# Patient Record
Sex: Male | Born: 1945 | Race: White | Hispanic: No | Marital: Married | Smoking: Never smoker
Health system: Southern US, Community
[De-identification: ages and names within clinical notes are randomized; demographics above are authoritative.]

## PROBLEM LIST (undated history)

## (undated) DIAGNOSIS — G629 Polyneuropathy, unspecified: Secondary | ICD-10-CM

## (undated) DIAGNOSIS — R42 Dizziness and giddiness: Secondary | ICD-10-CM

## (undated) DIAGNOSIS — K469 Unspecified abdominal hernia without obstruction or gangrene: Secondary | ICD-10-CM

## (undated) DIAGNOSIS — E785 Hyperlipidemia, unspecified: Secondary | ICD-10-CM

## (undated) DIAGNOSIS — Z9889 Other specified postprocedural states: Secondary | ICD-10-CM

## (undated) DIAGNOSIS — G4733 Obstructive sleep apnea (adult) (pediatric): Secondary | ICD-10-CM

## (undated) DIAGNOSIS — G4730 Sleep apnea, unspecified: Secondary | ICD-10-CM

## (undated) DIAGNOSIS — K589 Irritable bowel syndrome without diarrhea: Secondary | ICD-10-CM

## (undated) DIAGNOSIS — I2510 Atherosclerotic heart disease of native coronary artery without angina pectoris: Secondary | ICD-10-CM

## (undated) DIAGNOSIS — I452 Bifascicular block: Secondary | ICD-10-CM

## (undated) DIAGNOSIS — I6529 Occlusion and stenosis of unspecified carotid artery: Secondary | ICD-10-CM

## (undated) DIAGNOSIS — N209 Urinary calculus, unspecified: Secondary | ICD-10-CM

## (undated) DIAGNOSIS — Z9989 Dependence on other enabling machines and devices: Secondary | ICD-10-CM

## (undated) DIAGNOSIS — J449 Chronic obstructive pulmonary disease, unspecified: Secondary | ICD-10-CM

## (undated) DIAGNOSIS — I219 Acute myocardial infarction, unspecified: Secondary | ICD-10-CM

## (undated) DIAGNOSIS — K5792 Diverticulitis of intestine, part unspecified, without perforation or abscess without bleeding: Secondary | ICD-10-CM

## (undated) DIAGNOSIS — I10 Essential (primary) hypertension: Secondary | ICD-10-CM

## (undated) HISTORY — DX: Dependence on other enabling machines and devices: Z99.89

## (undated) HISTORY — DX: Acute myocardial infarction, unspecified: I21.9

## (undated) HISTORY — DX: Dizziness and giddiness: R42

## (undated) HISTORY — DX: Polyneuropathy, unspecified: G62.9

## (undated) HISTORY — DX: Other specified postprocedural states: Z98.890

## (undated) HISTORY — DX: Atherosclerotic heart disease of native coronary artery without angina pectoris: I25.10

## (undated) HISTORY — DX: Chronic obstructive pulmonary disease, unspecified: J44.9

## (undated) HISTORY — DX: Hyperlipidemia, unspecified: E78.5

## (undated) HISTORY — DX: Obstructive sleep apnea (adult) (pediatric): G47.33

## (undated) HISTORY — DX: Occlusion and stenosis of unspecified carotid artery: I65.29

## (undated) HISTORY — DX: Irritable bowel syndrome, unspecified: K58.9

## (undated) HISTORY — DX: Urinary calculus, unspecified: N20.9

## (undated) HISTORY — DX: Unspecified abdominal hernia without obstruction or gangrene: K46.9

## (undated) HISTORY — DX: Bifascicular block: I45.2

## (undated) HISTORY — DX: Essential (primary) hypertension: I10

## (undated) HISTORY — PX: UMBILICAL HERNIA REPAIR: SHX196

## (undated) HISTORY — PX: OTHER SURGICAL HISTORY: SHX169

## (undated) HISTORY — DX: Sleep apnea, unspecified: G47.30

## (undated) HISTORY — DX: Morbid (severe) obesity due to excess calories: E66.01

## (undated) HISTORY — DX: Diverticulitis of intestine, part unspecified, without perforation or abscess without bleeding: K57.92

---

## 1997-02-25 HISTORY — PX: OTHER SURGICAL HISTORY: SHX169

## 2000-07-01 ENCOUNTER — Ambulatory Visit (HOSPITAL_COMMUNITY): Admission: RE | Admit: 2000-07-01 | Discharge: 2000-07-01 | Payer: Self-pay | Admitting: Family Medicine

## 2000-07-01 ENCOUNTER — Encounter: Payer: Self-pay | Admitting: Family Medicine

## 2003-10-17 ENCOUNTER — Encounter: Admission: RE | Admit: 2003-10-17 | Discharge: 2003-10-17 | Payer: Self-pay | Admitting: Orthopedic Surgery

## 2005-04-10 DIAGNOSIS — Z9889 Other specified postprocedural states: Secondary | ICD-10-CM

## 2005-04-10 HISTORY — DX: Other specified postprocedural states: Z98.890

## 2009-11-12 ENCOUNTER — Ambulatory Visit: Payer: Self-pay | Admitting: Internal Medicine

## 2009-11-12 DIAGNOSIS — R0602 Shortness of breath: Secondary | ICD-10-CM | POA: Insufficient documentation

## 2009-11-12 LAB — PULMONARY FUNCTION TEST

## 2009-11-14 ENCOUNTER — Encounter: Payer: Self-pay | Admitting: Internal Medicine

## 2010-04-27 DIAGNOSIS — K5792 Diverticulitis of intestine, part unspecified, without perforation or abscess without bleeding: Secondary | ICD-10-CM

## 2010-04-27 HISTORY — DX: Diverticulitis of intestine, part unspecified, without perforation or abscess without bleeding: K57.92

## 2010-05-05 ENCOUNTER — Ambulatory Visit: Payer: Self-pay | Admitting: Diagnostic Radiology

## 2010-05-05 ENCOUNTER — Inpatient Hospital Stay (HOSPITAL_COMMUNITY): Admission: EM | Admit: 2010-05-05 | Discharge: 2010-05-10 | Payer: Self-pay | Admitting: Internal Medicine

## 2010-05-05 ENCOUNTER — Encounter: Payer: Self-pay | Admitting: Emergency Medicine

## 2010-07-25 ENCOUNTER — Encounter
Admission: RE | Admit: 2010-07-25 | Discharge: 2010-07-25 | Payer: Self-pay | Source: Home / Self Care | Attending: Gastroenterology | Admitting: Gastroenterology

## 2010-08-27 NOTE — Miscellaneous (Signed)
Summary: Orders Update  Clinical Lists Changes  Problems: Added new problem of DYSPNEA (ICD-786.05) Orders: Added new Service order of Carbon Monoxide diffusing w/capacity [id number]) - Signed Added new Service order of Lung Volumes (60454) - Signed Added new Service order of Spirometry (Pre & Post) [phone]) - Signed

## 2010-09-10 ENCOUNTER — Ambulatory Visit (HOSPITAL_COMMUNITY)
Admission: RE | Admit: 2010-09-10 | Discharge: 2010-09-10 | Disposition: A | Discharge status: Home / Self Care | Payer: Managed Care, Other (non HMO) | Source: Ambulatory Visit | Attending: General Surgery | Admitting: General Surgery

## 2010-09-10 ENCOUNTER — Other Ambulatory Visit: Payer: Self-pay | Admitting: General Surgery

## 2010-09-10 ENCOUNTER — Encounter (HOSPITAL_COMMUNITY): Payer: Managed Care, Other (non HMO)

## 2010-09-10 ENCOUNTER — Other Ambulatory Visit (HOSPITAL_COMMUNITY): Payer: Self-pay | Admitting: General Surgery

## 2010-09-10 DIAGNOSIS — Z01812 Encounter for preprocedural laboratory examination: Secondary | ICD-10-CM | POA: Insufficient documentation

## 2010-09-10 DIAGNOSIS — Z01818 Encounter for other preprocedural examination: Secondary | ICD-10-CM

## 2010-09-10 DIAGNOSIS — I517 Cardiomegaly: Secondary | ICD-10-CM | POA: Insufficient documentation

## 2010-09-10 DIAGNOSIS — R0602 Shortness of breath: Secondary | ICD-10-CM | POA: Insufficient documentation

## 2010-09-10 LAB — COMPREHENSIVE METABOLIC PANEL
ALT: 41 U/L (ref 0–53)
AST: 22 U/L (ref 0–37)
Albumin: 4.3 g/dL (ref 3.5–5.2)
Alkaline Phosphatase: 66 U/L (ref 39–117)
CO2: 29 mEq/L (ref 19–32)
Chloride: 104 mEq/L (ref 96–112)
Creatinine, Ser: 0.86 mg/dL (ref 0.4–1.5)
GFR calc Af Amer: >60 mL/min (ref >60)
GFR calc non Af Amer: >60 mL/min (ref >60)
Potassium: 4.5 mEq/L (ref 3.5–5.1)
Sodium: 140 mEq/L (ref 135–145)
Total Bilirubin: 1.6 mg/dL — ABNORMAL HIGH (ref 0.3–1.2)

## 2010-09-10 LAB — DIFFERENTIAL
Basophils Absolute: 0 10*3/uL (ref 0.0–0.1)
Eosinophils Relative: 2 % (ref 0–5)
Lymphocytes Relative: 32 % (ref 12–46)
Lymphs Abs: 2.5 10*3/uL (ref 0.7–4.0)
Neutro Abs: 4.5 10*3/uL (ref 1.7–7.7)

## 2010-09-10 LAB — CBC
MCV: 90 fL (ref 78.0–100.0)
Platelets: 146 10*3/uL — ABNORMAL LOW (ref 150–400)
RBC: 5.12 MIL/uL (ref 4.22–5.81)
RDW: 12.8 % (ref 11.5–15.5)
WBC: 7.9 10*3/uL (ref 4.0–10.5)

## 2010-09-18 ENCOUNTER — Inpatient Hospital Stay (HOSPITAL_COMMUNITY)
Admission: RE | Admit: 2010-09-18 | Discharge: 2010-09-26 | DRG: 330 | Disposition: A | Discharge status: Home / Self Care | Payer: Managed Care, Other (non HMO) | Source: Ambulatory Visit | Attending: General Surgery | Admitting: General Surgery

## 2010-09-18 ENCOUNTER — Other Ambulatory Visit: Payer: Self-pay | Admitting: General Surgery

## 2010-09-18 DIAGNOSIS — I10 Essential (primary) hypertension: Secondary | ICD-10-CM | POA: Diagnosis present

## 2010-09-18 DIAGNOSIS — Z951 Presence of aortocoronary bypass graft: Secondary | ICD-10-CM

## 2010-09-18 DIAGNOSIS — I2510 Atherosclerotic heart disease of native coronary artery without angina pectoris: Secondary | ICD-10-CM | POA: Diagnosis present

## 2010-09-18 DIAGNOSIS — K560 Paralytic ileus: Secondary | ICD-10-CM | POA: Diagnosis not present

## 2010-09-18 DIAGNOSIS — J449 Chronic obstructive pulmonary disease, unspecified: Secondary | ICD-10-CM | POA: Diagnosis present

## 2010-09-18 DIAGNOSIS — K5732 Diverticulitis of large intestine without perforation or abscess without bleeding: Principal | ICD-10-CM | POA: Diagnosis present

## 2010-09-18 DIAGNOSIS — G4733 Obstructive sleep apnea (adult) (pediatric): Secondary | ICD-10-CM | POA: Diagnosis present

## 2010-09-18 DIAGNOSIS — K429 Umbilical hernia without obstruction or gangrene: Secondary | ICD-10-CM | POA: Diagnosis present

## 2010-09-18 DIAGNOSIS — J4489 Other specified chronic obstructive pulmonary disease: Secondary | ICD-10-CM | POA: Diagnosis present

## 2010-09-18 HISTORY — PX: URETERAL STENT PLACEMENT: SHX822

## 2010-09-18 LAB — ABO/RH: ABO/RH(D): O POS

## 2010-09-18 LAB — TYPE AND SCREEN: ABO/RH(D): O POS

## 2010-09-18 NOTE — Op Note (Signed)
NAME:  BASCOM, BIEL NO.:  [id number]  MEDICAL RECORD NO.:  [id number]           PATIENT TYPE:  I  LOCATION:  0002                         FACILITY:  [facility]  PHYSICIAN:  Juanetta Gosling, MDDATE OF BIRTH:  [birth year]  DATE OF PROCEDURE: DATE OF DISCHARGE:                              OPERATIVE REPORT   PREOPERATIVE DIAGNOSIS:  Persistent diverticulitis.  POSTOPERATIVE DIAGNOSIS:  Persistent chronic diverticulitis.  PROCEDURE: 1. Laparoscopic-assisted sigmoid colectomy with primary hand-sewn colo-     colo anastomosis. 2. Rigid proctoscopy. 3. Primary umbilical repair. 4. Left ureteral stent placement by Dr. Marcine Matar.  SURGEON:  Juanetta Gosling, MD  ASSISTANT:  Ollen Gross. Vernell Morgans, M.D.  ANESTHESIA:  General.  FINDINGS:  Focal segment of sigmoid diverticulitis with evidence of chronic changes that was adherent to the sidewall of his abdomen.  SPECIMEN:  Sigmoid colon with stitch marking proximal.  SPECIMEN:  Pathology.  ESTIMATED BLOOD LOSS:  100 mL.  COMPLICATIONS:  None.  DRAINS:  None.  DISPOSITION:  To recovery room in stable condition.  INDICATIONS:  Mr. Swor is a 65 year old male with issue admitted in October with complicated diverticulitis.  He has been unable to get off his antibiotics during that time and has had what I would call persistent diverticulitis.  He and I eventually after trying to go off the antibiotic several times have both decided and with an abnormal CT scan have both decided that we would need to proceed with surgery with the sigmoid colectomy.  We discussed laparoscopic possible open sigmoid colectomy with the risk of a possible ostomy depending on what his disease looked like at the time of surgery.  PROCEDURE:  After informed consent was obtained, the patient was taken to the operating room.  He was marked preoperatively for an ostomy just in case he was administered 400 mg of IV ciprofloxacin,  500 mg of Flagyl due to the fact that he had a reaction to Invanz in the past.  He had sequential compression devices placed on lower extremities prior to induction of anesthesia and was then placed in low lithotomy.  At the sigmoid, Dr. Marcine Matar placed a left ureteral stent first.  His abdomen was prepped and draped in standard sterile surgical fashion.  A surgical time-out was then performed.  An orogastric tube and Foley as well as a stent were in place.  I then made an incision in the left upper quadrant and infiltration with local anesthetic and did an Opti-Vu technique and inserted a 5-mm trocar.  His abdomen was then insufflated with 15 mmHg pressure.  He has had a lot of additional fat and was difficult to do this portion of the operation.  I then inserted two further 5-mm trocars in the right lower quadrant and midabdomen as well as a 10-mm trocar in the right midabdomen.  These were all done under direct vision without complication after infiltration with local anesthetic.  His omentum was packed towards up cephalad.  I then took down the [employer] near the splenic flexure.  He really had a very focal segment of sigmoid diverticular disease.  That was not any longer than about 6 cm.  There were no other real diverticular to speak of and his colon was soft around those areas. I used a suction catheter to take this down from the wall, which I did when there was no evidence of infection.  I did not enter the bile during this portion of the procedure, rather it was more just of chronic changes but it was very adherent to that area and hard.  I then inserted a hand port below his umbilicus.  The colon was very free at this point. We used a stapler and excised this focal segment of the sigmoid colon. Both ends were then without any tension and were then brought up.  I cut the staple lines off after placing corner stitches.  I then did a single layer 2-0 silk  interrupted anastomosis from his colon to his rectum. This appeared to look well upon completion.  We probed the areas.  There were no spaces that were identified.  I then did a rigid proctoscope which distended pass the anastomosis and there was no evidence of any leakage of air.  Following this, we then inspected the area.  Hemostasis was observed.  We then evacuated the irrigation.  I then placed the GelPort back and looked and I placed the omentum down his left lower quadrant.  There is no evidence of any bleeding where I had removed this colon from the sidewall.  I then removed all the laparoscopic trocars. I then pulled a hand port out and we then closed his fascia with #1 loop PDS.  I extended the incision a little bit to include his small umbilical hernia and then I closed this primarily with the #1 loop PDS as well.  Irrigation was performed to his wound.  I then stapled all of his wounds together, placed on Telfa wicks inside the abdominal wound, and dressings were placed overlying these.  He tolerated this well, was extubated in the operating room and transferred to the recovery room in stable condition.     Juanetta Gosling, MD     MCW/MEDQ  D:  09/18/2010  T:  09/18/2010  Job:  [id number]  cc:   Emeterio Reeve, MD Fax: [phone]  Llana Aliment. Malon Kindle., M.D. Fax: [phone]  Jake Bathe, MD Fax: [phone]  Electronically Signed by Emelia Loron MD on 09/18/2010 04:51:39 PM

## 2010-09-19 LAB — CBC
HCT: 43.4 % (ref 39.0–52.0)
MCV: 93.5 fL (ref 78.0–100.0)
RBC: 4.64 MIL/uL (ref 4.22–5.81)
RDW: 13.1 % (ref 11.5–15.5)
WBC: 9.5 10*3/uL (ref 4.0–10.5)

## 2010-09-19 LAB — BASIC METABOLIC PANEL
Chloride: 106 mEq/L (ref 96–112)
GFR calc non Af Amer: >60 mL/min (ref >60)
Glucose, Bld: 133 mg/dL — ABNORMAL HIGH (ref 70–99)
Potassium: 4.6 mEq/L (ref 3.5–5.1)
Sodium: 139 mEq/L (ref 135–145)

## 2010-09-21 LAB — BASIC METABOLIC PANEL
BUN: 7 mg/dL (ref 6–23)
CO2: 25 mEq/L (ref 19–32)
Glucose, Bld: 125 mg/dL — ABNORMAL HIGH (ref 70–99)
Potassium: 4 mEq/L (ref 3.5–5.1)
Sodium: 138 mEq/L (ref 135–145)

## 2010-09-21 LAB — CBC
HCT: 41.4 % (ref 39.0–52.0)
Hemoglobin: 13.8 g/dL (ref 13.0–17.0)
MCHC: 33.3 g/dL (ref 30.0–36.0)
MCV: 91.6 fL (ref 78.0–100.0)
WBC: 7.6 10*3/uL (ref 4.0–10.5)

## 2010-09-24 LAB — BASIC METABOLIC PANEL
BUN: 5 mg/dL — ABNORMAL LOW (ref 6–23)
CO2: 26 mEq/L (ref 19–32)
Calcium: 8.9 mg/dL (ref 8.4–10.5)
Creatinine, Ser: 0.76 mg/dL (ref 0.4–1.5)
Glucose, Bld: 122 mg/dL — ABNORMAL HIGH (ref 70–99)

## 2010-09-24 LAB — CBC
HCT: 38.2 % — ABNORMAL LOW (ref 39.0–52.0)
Hemoglobin: 12.9 g/dL — ABNORMAL LOW (ref 13.0–17.0)
MCH: 30.9 pg (ref 26.0–34.0)
MCHC: 33.8 g/dL (ref 30.0–36.0)

## 2010-09-26 NOTE — Op Note (Signed)
  NAME:  Paul Bryant, BARRACK NO.:  [id number]  MEDICAL RECORD NO.:  [id number]           PATIENT TYPE:  I  LOCATION:  0002                         FACILITY:  [facility]  PHYSICIAN:  Bertram Millard. Tacari Repass, M.D.DATE OF BIRTH:  Nov 14, 1945  DATE OF PROCEDURE:  09/18/2010 DATE OF DISCHARGE:                              OPERATIVE REPORT   PREOPERATIVE DIAGNOSIS:  Diverticulitis.  POSTOPERATIVE DIAGNOSIS:  Diverticulitis.  PRINCIPAL PROCEDURE: 1. Cystoscopy. 2. Placement of 6-French ureteral catheter in left ureter.  SURGEON:  Bertram Millard. Slayter Moorhouse, M.D.  ANESTHESIA:  General endotracheal.  COMPLICATIONS:  None.  BRIEF HISTORY:  The patient is a 65 year old male who presents at this time for sigmoid colectomy, as primary treatment for diverticulitis. Dr. Dwain Sarna has requested ureteral catheter placement to assist in his dissection, to avoid ureteral injury during the colectomy.  I met the patient preoperatively in the holding area and discussed the procedure.  He understands the nature of cystoscopy and ureteral catheter placement and desires to proceed.  DESCRIPTION OF PROCEDURE:  I marked the patient's left side in the holding area.  He received preoperative IV antibiotics.  He was taken to the operating room where general anesthetic was administered.  He was placed in the dorsal lithotomy position.  Genitalia and perineum were prepped and draped.  Time-out was then called.  The procedure then commenced.  A 22-French panendoscope was advanced under direct vision through his urethra.  Urethra was normal without stricture.  Prostate was nonobstructive.  Bladder was entered and inspected circumferentially.  The no tumors, trabeculations, or foreign bodies.  Ureteral orifices were normal in configuration and location. The left ureter was cannulated with 6-French open-ended catheter and this was easily advanced proximally to the patient's renal pelvis. Urine was seen  dripping out the distal end of this.  At this point, the cystoscope was removed after the bladder was drained.  I placed a 16- [place] Foley catheter.  The balloon was filled with 10 cc of water.  A Goldberg catheter adapter was used to drain the ureteral catheter and the Foley catheter.  The ureteral catheter was tied to the patient's Foley catheter.  I discussed the need to remove the ureteral catheter with the nursing staff following the procedure.  They will do this.  The patient tolerated the procedure well.  Dr. Dwain Sarna then commenced with his part of the procedure.     Bertram Millard. Cadon Raczka, M.D.     SMD/MEDQ  D:  09/18/2010  T:  09/18/2010  Job:  [id number]  cc:   Juanetta Gosling, MD [address] [place] [address]  Electronically Signed by Marcine Matar M.D. on 09/26/2010 07:57:31 AM

## 2010-09-30 NOTE — Discharge Summary (Signed)
NAME:  STORY, VANVRANKEN NO.:  [id number]  MEDICAL RECORD NO.:  [id number]           PATIENT TYPE:  I  LOCATION:  1522                         FACILITY:  [facility]  PHYSICIAN:  Juanetta Gosling, MDDATE OF BIRTH:  [birth year]  DATE OF ADMISSION:  09/18/2010 DATE OF DISCHARGE:  09/26/2010                              DISCHARGE SUMMARY   ADMISSION DIAGNOSES: 1. Chronic diverticular disease. 2. Coronary artery disease. 3. Hypertension. 4. Diabetes.  DISCHARGE DIAGNOSES: 1. Chronic diverticular disease. 2. Coronary artery disease. 3. Hypertension. 4. Diabetes. 5. Resolved ileus.  PROCEDURES PERFORMED:  September 18, 2010, laparoscopic-assisted sigmoid colectomy with primary hand-sewn anastomosis, umbilical hernia repair, rigid proctoscopy, left ureteral stent by Dr. Willow Ora.  CONSULTATIONS:  None.  HISTORY AND HOSPITAL COURSE:  Mr. Lemelle is a 65 year old male who initially was admitted in October with complicated diverticulitis.  He was really unable to come off of antibiotics by the time I had seen him again in January.  He underwent a repeat CT scan with Dr. Randa Evens still showing some persistent diverticular changes and he was still having symptoms.  We discussed several times and eventually agreed upon a sigmoid colectomy.  He was taken to the operating room on February 22 and underwent a fairly uneventful laparoscopic-assisted sigmoid colectomy with primary anastomosis.  Postoperatively he was maintained on the floor.  He was placed on DVT prophylaxis with subcutaneous heparin and later switched to Lovenox and SCDs.  His Foley catheter was removed on postoperative day #2.  He was given ice chips for the first several days.  He began to pass some flatus on postoperative day #5 and from postoperative day #5 to #8 he began to pass more flatus, have bowel movements and his diet was advanced and he was tolerating this well.  He had some ecchymosis  around some of his incisions  and I actually removed a couple of staples from his abdominal wound just to see if there was any infection and there was not.  I had had him on some ciprofloxacin prior to this just to ensure that there was no infection, but it does not appear that he had had a wound infection, just some old hematoma that was present.  On postoperative day #8 he was having bowel movements, tolerating a regular diet, doing well with pain under control and is ready for discharge.  DISCHARGE MEDICATIONS: 1. Colace 100 mg p.o. b.i.d. 2. Percocet as needed. 3. Aspirin 81 mg p.o. daily. 4. Avapro 300 mg q.h.s. 5. Colchicine as needed. 6. Crestor 40 mg daily. 7. Lasix 20 mg daily. 8. Nitroglycerin as needed. 9. Proventil as needed. 10.Xanax as needed. 11.Zebeta 10 mg b.i.d.  DISCHARGE INSTRUCTIONS:  His discharge instructions are per the hard copy of [facility] instructions that have been placed in his chart.  FOLLOWUP:  His followup was with Dr. Dwain Sarna at [facility] on March 9 at 2:45.  RADIOLOGY:  His pertinent radiologic evaluation showed a chest x-ray on February 14 that showed some cardiomegaly and retrocardiac atelectasis or scarring.  LABORATORY DATA:  His pertinent laboratory evaluation shows an admission hematocrit of 46.1 and  a discharge hematocrit of 38.2.  His creatinine upon discharge is 0.76.  PATHOLOGY:  His pathology shows him to have diverticulitis with associated pericolonic inflammation and fibrosis with no malignancy identified.     Juanetta Gosling, MD     MCW/MEDQ  D:  09/26/2010  T:  09/26/2010  Job:  [id number]  cc:   Emeterio Reeve, MD Fax: [phone]  Llana Aliment. Malon Kindle., M.D. Fax: [phone]  Jake Bathe, MD Fax: [phone]  Electronically Signed by Emelia Loron MD on 09/30/2010 09:54:35 AM

## 2010-10-10 LAB — COMPREHENSIVE METABOLIC PANEL
AST: 16 U/L (ref 0–37)
Albumin: 4.2 g/dL (ref 3.5–5.2)
Albumin: 3.8 g/dL (ref 3.5–5.2)
Alkaline Phosphatase: 58 U/L (ref 39–117)
BUN: 20 mg/dL (ref 6–23)
BUN: 11 mg/dL (ref 6–23)
BUN: 13 mg/dL (ref 6–23)
CO2: 27 mEq/L (ref 19–32)
Chloride: 102 mEq/L (ref 96–112)
Chloride: 103 mEq/L (ref 96–112)
Creatinine, Ser: 0.8 mg/dL (ref 0.4–1.5)
Creatinine, Ser: 0.77 mg/dL (ref 0.4–1.5)
Creatinine, Ser: 0.86 mg/dL (ref 0.4–1.5)
GFR calc Af Amer: >60 mL/min (ref >60)
GFR calc non Af Amer: >60 mL/min (ref >60)
GFR calc non Af Amer: >60 mL/min (ref >60)
Potassium: 3.9 mEq/L (ref 3.5–5.1)
Total Bilirubin: 1.5 mg/dL — ABNORMAL HIGH (ref 0.3–1.2)
Total Bilirubin: 3.1 mg/dL — ABNORMAL HIGH (ref 0.3–1.2)
Total Protein: 6.7 g/dL (ref 6.0–8.3)

## 2010-10-10 LAB — CBC
HCT: 40.6 % (ref 39.0–52.0)
Hemoglobin: 14 g/dL (ref 13.0–17.0)
Hemoglobin: 14 g/dL (ref 13.0–17.0)
MCH: 31.7 pg (ref 26.0–34.0)
MCH: 31.2 pg (ref 26.0–34.0)
MCH: 31.7 pg (ref 26.0–34.0)
MCHC: 33.8 g/dL (ref 30.0–36.0)
MCHC: 35.2 g/dL (ref 30.0–36.0)
MCV: 93.2 fL (ref 78.0–100.0)
MCV: 90.2 fL (ref 78.0–100.0)
MCV: 91.9 fL (ref 78.0–100.0)
MCV: 93.8 fL (ref 78.0–100.0)
Platelets: 138 10*3/uL — ABNORMAL LOW (ref 150–400)
Platelets: 130 10*3/uL — ABNORMAL LOW (ref 150–400)
Platelets: 149 10*3/uL — ABNORMAL LOW (ref 150–400)
RBC: 4.67 MIL/uL (ref 4.22–5.81)
RBC: 4.37 MIL/uL (ref 4.22–5.81)
RBC: 4.41 MIL/uL (ref 4.22–5.81)
RBC: 4.42 MIL/uL (ref 4.22–5.81)
RDW: 12.5 % (ref 11.5–15.5)
RDW: 12.9 % (ref 11.5–15.5)
WBC: 10.5 10*3/uL (ref 4.0–10.5)
WBC: 11 10*3/uL — ABNORMAL HIGH (ref 4.0–10.5)

## 2010-10-10 LAB — BASIC METABOLIC PANEL
BUN: 8 mg/dL (ref 6–23)
CO2: 25 mEq/L (ref 19–32)
CO2: 28 mEq/L (ref 19–32)
Calcium: 8.7 mg/dL (ref 8.4–10.5)
Calcium: 9.2 mg/dL (ref 8.4–10.5)
Chloride: 105 mEq/L (ref 96–112)
Creatinine, Ser: 0.7 mg/dL (ref 0.4–1.5)
Creatinine, Ser: 0.83 mg/dL (ref 0.4–1.5)
GFR calc Af Amer: >60 mL/min (ref >60)
GFR calc Af Amer: >60 mL/min (ref >60)
GFR calc non Af Amer: >60 mL/min (ref >60)
GFR calc non Af Amer: >60 mL/min (ref >60)
Glucose, Bld: 144 mg/dL — ABNORMAL HIGH (ref 70–99)
Potassium: 4.1 mEq/L (ref 3.5–5.1)
Sodium: 137 mEq/L (ref 135–145)
Sodium: 139 mEq/L (ref 135–145)

## 2010-10-10 LAB — CULTURE, BLOOD (ROUTINE X 2)
Culture  Setup Time: 201110101021
Culture: NO GROWTH
Culture: NO GROWTH

## 2010-10-10 LAB — URINALYSIS, ROUTINE W REFLEX MICROSCOPIC
Bilirubin Urine: NEGATIVE
Hgb urine dipstick: NEGATIVE
Ketones, ur: NEGATIVE mg/dL
Protein, ur: NEGATIVE mg/dL
Urobilinogen, UA: 0.2 mg/dL (ref 0.0–1.0)

## 2010-10-10 LAB — MAGNESIUM: Magnesium: 2 mg/dL (ref 1.5–2.5)

## 2010-10-10 LAB — HEPATIC FUNCTION PANEL
AST: 24 U/L (ref 0–37)
Albumin: 3.3 g/dL — ABNORMAL LOW (ref 3.5–5.2)
Albumin: 3.5 g/dL (ref 3.5–5.2)
Alkaline Phosphatase: 56 U/L (ref 39–117)
Bilirubin, Direct: 0.3 mg/dL (ref 0.0–0.3)
Indirect Bilirubin: 2 mg/dL — ABNORMAL HIGH (ref 0.3–0.9)
Total Bilirubin: 2.1 mg/dL — ABNORMAL HIGH (ref 0.3–1.2)
Total Protein: 6.5 g/dL (ref 6.0–8.3)

## 2010-10-10 LAB — LACTATE DEHYDROGENASE: LDH: 116 U/L (ref 94–250)

## 2010-10-10 LAB — HAPTOGLOBIN: Haptoglobin: 314 mg/dL — ABNORMAL HIGH (ref 16–200)

## 2010-10-10 LAB — HEPATITIS PANEL, ACUTE
HCV Ab: NEGATIVE
Hep B C IgM: NEGATIVE
Hepatitis B Surface Ag: NEGATIVE

## 2010-10-10 LAB — DIFFERENTIAL
Basophils Absolute: 0.1 10*3/uL (ref 0.0–0.1)
Lymphocytes Relative: 19 % (ref 12–46)
Monocytes Absolute: 0.4 10*3/uL (ref 0.1–1.0)
Neutro Abs: 7.2 10*3/uL (ref 1.7–7.7)

## 2010-11-23 ENCOUNTER — Encounter (INDEPENDENT_AMBULATORY_CARE_PROVIDER_SITE_OTHER): Payer: Self-pay | Admitting: General Surgery

## 2011-03-14 ENCOUNTER — Other Ambulatory Visit: Payer: Self-pay | Admitting: Family Medicine

## 2011-03-14 ENCOUNTER — Ambulatory Visit
Admission: RE | Admit: 2011-03-14 | Discharge: 2011-03-14 | Disposition: A | Discharge status: Home / Self Care | Payer: Managed Care, Other (non HMO) | Source: Ambulatory Visit | Attending: Family Medicine | Admitting: Family Medicine

## 2011-03-14 DIAGNOSIS — J189 Pneumonia, unspecified organism: Secondary | ICD-10-CM

## 2011-05-02 ENCOUNTER — Ambulatory Visit
Admission: RE | Admit: 2011-05-02 | Discharge: 2011-05-02 | Disposition: A | Discharge status: Home / Self Care | Payer: Managed Care, Other (non HMO) | Source: Ambulatory Visit | Attending: Family Medicine | Admitting: Family Medicine

## 2011-05-02 ENCOUNTER — Other Ambulatory Visit: Payer: Self-pay | Admitting: Family Medicine

## 2011-05-02 DIAGNOSIS — J189 Pneumonia, unspecified organism: Secondary | ICD-10-CM

## 2011-06-12 ENCOUNTER — Encounter: Payer: Self-pay | Admitting: Internal Medicine

## 2011-06-13 ENCOUNTER — Encounter: Payer: Self-pay | Admitting: Internal Medicine

## 2011-06-13 ENCOUNTER — Ambulatory Visit (INDEPENDENT_AMBULATORY_CARE_PROVIDER_SITE_OTHER): Payer: Managed Care, Other (non HMO) | Admitting: Internal Medicine

## 2011-06-13 VITALS — BP 148/70 | HR 76 | Ht 72.0 in | Wt 316.0 lb

## 2011-06-13 DIAGNOSIS — G4733 Obstructive sleep apnea (adult) (pediatric): Secondary | ICD-10-CM

## 2011-06-13 DIAGNOSIS — R0602 Shortness of breath: Secondary | ICD-10-CM

## 2011-06-13 DIAGNOSIS — R0600 Dyspnea, unspecified: Secondary | ICD-10-CM

## 2011-06-13 DIAGNOSIS — R0989 Other specified symptoms and signs involving the circulatory and respiratory systems: Secondary | ICD-10-CM

## 2011-06-13 NOTE — Progress Notes (Signed)
06/13/11- 55 yoM never smoker here with his wife on kind referral from Dr. Paulino Rily for complaint of dyspnea.  He had pneumonia with a flu like illness early in the year and then again in August 2012, both treated as outpatient. He had been playing tennis regularly, as recently as April or May. He had a partial colectomy in March of 2012. He had tried to ride an [employer] [place] [employer] he had to stop that when he developed pressure soreness from the seat. He has had no regular exercise since June of 2012. He regained weight he had lost. Activity is now limited by knee pain.   He had been retaining fluid. He was evaluated for cardiology at [facility] and told his cardiac situation was stable. An echocardiogram was done and Lasix was doubled. 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 He denies exertional heart pain or palpitation now and ankle edema is controlled. There is no history of DVT or pulmonary embolism. NPSG at [facility]- dx'd OSA, treated with CPAP used all night, every night and helpful. He is asking that I address this. We will need to get his original diagnostic sleep study. Has had flu and pneumonia vaccines.  ROS-see HPI Constitutional:   No-   weight loss, night sweats, fevers, chills, fatigue, lassitude. HEENT:   No-  headaches, difficulty swallowing, tooth/dental problems, sore throat,       No-  sneezing, itching, ear ache, nasal congestion, post nasal drip,  CV:  No-   chest pain, orthopnea, PND, swelling in lower extremities, anasarca,                                  dizziness, palpitations Resp: + shortness of breath with exertion, not at rest.              No-   productive cough,  No non-productive cough,  No- coughing up of blood.              No-   change in color of mucus.  No- wheezing.   Skin: No-   rash or lesions. GI:  No-   heartburn, indigestion, abdominal pain, nausea, vomiting, diarrhea,             change in bowel habits, loss of appetite GU: No-   dysuria, change in color of urine, no urgency or frequency.  No- flank pain. MS:  No-   joint pain or swelling.  No- decreased range of motion.  No- back pain. Neuro-     nothing unusual Psych:  No- change in mood or affect. No depression or anxiety.  No memory loss.  OBJ- General- Alert, Oriented, Affect-appropriate, Distress- none acute, very obese Skin- rash-none, lesions- none, excoriation- none Lymphadenopathy- none Head- atraumatic            Eyes- Gross vision intact, PERRLA, conjunctivae clear secretions            Ears- Hearing, canals-normal            Nose- Clear, no-Septal dev, mucus, polyps, erosion, perforation             Throat- Mallampati IV , mucosa clear , drainage- none, tonsils- atrophic Neck- flexible , trachea midline, no stridor , thyroid nl, carotid no bruit Chest - symmetrical excursion , unlabored  Heart/CV- RRR , + 2/6 Systolic murmur , no gallop  , no rub, nl s1 s2                           - JVD- none , edema- 1-2+, stasis changes- none, varices- none           Lung- clear to P&A, wheeze- none, cough- none , dullness-none, rub- none           Chest wall-  Abd- tender-no, distended-no, bowel sounds-present, HSM- no Br/ Gen/ Rectal- Not done, not indicated Extrem- cyanosis- none, clubbing, none, atrophy- none, strength- nl Neuro- grossly intact to observation

## 2011-06-13 NOTE — Progress Notes (Deleted)
  Subjective:    Patient ID: Paul Bryant, male    DOB: 1945-08-02, 65 y.o.   MRN: [id number]  HPI    Review of Systems  Constitutional: Positive for unexpected weight change. Negative for fever.  Respiratory: Positive for cough and shortness of breath.   Musculoskeletal: Positive for joint swelling.       Objective:   Physical Exam        Assessment & Plan:

## 2011-06-13 NOTE — Patient Instructions (Signed)
Order- schedule PFT and 6 MWT dx Dyspnea  We will check for old records including old sleep studies  Sample Spiriva - 1 puff daily- see if it helps shortness of breath  Use up and then stop Symbicort- see if you can tell it made any difference

## 2011-06-15 DIAGNOSIS — G4733 Obstructive sleep apnea (adult) (pediatric): Secondary | ICD-10-CM | POA: Insufficient documentation

## 2011-06-15 NOTE — Assessment & Plan Note (Signed)
We will pull paper charts and try to get his original diagnostic sleep study from the originating lab. Eventually we may need to auto titrate for pressure check.

## 2011-06-15 NOTE — Assessment & Plan Note (Signed)
Most of the change this summer as follows his reported 2 episodes of pneumonia, partial colectomy, cessation of exercise and associated weight gain.  There will be a component from cardiomyopathy based on his history of myocardial infarction and reduced ejection fraction on echocardiogram. His ankle edema is nonspecific but may have cardiac and peripheral venous components. There is no history to indicate venous thromboembolic disease He is severely overweight and has been for many years. Obesity hypoventilation syndrome as likely, but he doesn't complain of dyspnea at rest. Plan- we are scheduling 6 minute walk test and pulmonary function tests. He will try a sample of Spiriva. He has a Symbicort inhaler, used intermittently. I have asked him to use that twice daily until it runs out and then stop, so that he can decide if it helps or not. I am not anticipating much reactive airways disease.

## 2011-06-24 ENCOUNTER — Encounter: Payer: Self-pay | Admitting: Internal Medicine

## 2011-07-24 ENCOUNTER — Ambulatory Visit (INDEPENDENT_AMBULATORY_CARE_PROVIDER_SITE_OTHER): Payer: Managed Care, Other (non HMO) | Admitting: Internal Medicine

## 2011-07-24 ENCOUNTER — Encounter: Payer: Self-pay | Admitting: Internal Medicine

## 2011-07-24 VITALS — BP 120/82 | HR 91 | Ht 72.0 in | Wt 312.0 lb

## 2011-07-24 DIAGNOSIS — R0600 Dyspnea, unspecified: Secondary | ICD-10-CM

## 2011-07-24 DIAGNOSIS — R0602 Shortness of breath: Secondary | ICD-10-CM

## 2011-07-24 DIAGNOSIS — R0989 Other specified symptoms and signs involving the circulatory and respiratory systems: Secondary | ICD-10-CM

## 2011-07-24 DIAGNOSIS — G4733 Obstructive sleep apnea (adult) (pediatric): Secondary | ICD-10-CM

## 2011-07-24 LAB — PULMONARY FUNCTION TEST

## 2011-07-24 NOTE — Patient Instructions (Signed)
Our staff is being asked to get your original diagnostic NPSG report from [facility]  Order- Christoper Allegra- Autotitrate CPAP for pressure check- 5-20 cwp x 2 weeks   Dx OSA                        Replacement mask of choice and supplies  Suggest you get a pulse oximeter and use it to guide exertion to keep Oxygen saturation above 89%

## 2011-07-24 NOTE — Progress Notes (Signed)
PFT done today. 

## 2011-07-24 NOTE — Progress Notes (Signed)
06/13/11- [name] never smoker here with his wife on kind referral from Dr. Paulino Bryant for complaint of dyspnea.  He had pneumonia with a flu like illness early in the year and then again in August 2012, both treated as outpatient. He had been playing tennis regularly, as recently as April or May. He had a partial colectomy in March of 2012. He had tried to ride an [employer], [employer] at [employer] He had to stop that when he developed pressure soreness from the seat. He has had no regular exercise since June of 2012. He regained weight he had lost. Activity is now limited by knee pain.   He had been retaining fluid. He was evaluated for cardiology at [place] and told his cardiac situation was stable. An echocardiogram was done and Lasix was doubled. 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 He denies exertional heart pain or palpitation now and ankle edema is controlled. There is no history of DVT or pulmonary embolism. NPSG at [facility]- dx'd OSA, treated with CPAP used all night, every night and helpful. He is asking that I address this. We will need to get his original diagnostic sleep study. Has had flu and pneumonia vaccines.  07/24/11- 65 yoM never smoker followed for Dyspnea, OSA, obesity, CAD/Hx MI . Wife here No significant change since last visit. He comes for review pulmonary function studies. PFT-07/24/2011-normal spirometry flows within significant response to bronchodilator. Diffusion mildly reduced. FEV1/FVC 0.77. 6 Minute Walk Test- 96%, 88%, 98% 432 meters. Note exertional desaturation.   ROS-see HPI Constitutional:   No-   weight loss, night sweats, fevers, chills, fatigue, lassitude. HEENT:   No-  headaches, difficulty swallowing, tooth/dental problems, sore throat,       No-  sneezing, itching, ear ache, nasal congestion, post nasal drip,  CV:  No-   chest pain, orthopnea, PND, swelling in lower extremities,  anasarca, dizziness, palpitations Resp: + shortness of breath with exertion, not at rest.              No-   productive cough,  No non-productive cough,  No- coughing up of blood.              No-   change in color of mucus.  No- wheezing.   Skin: No-   rash or lesions. GI:  No-   heartburn, indigestion, abdominal pain, nausea, vomiting, diarrhea,                 change in bowel habits, loss of appetite GU: MS:  No-   joint pain or swelling.  No- decreased range of motion.  No- back pain. Neuro-     nothing unusual Psych:  No- change in mood or affect. No depression or anxiety.  No memory loss.  OBJ- General- Alert, Oriented, Affect-appropriate, Distress- none acute, very obese Skin- rash-none, lesions- none, excoriation- none Lymphadenopathy- none Head- atraumatic            Eyes- Gross vision intact, PERRLA, conjunctivae clear secretions            Ears- Hearing, canals-normal            Nose- Clear, no-Septal dev, mucus, polyps, erosion, perforation             Throat- Mallampati IV , mucosa clear , drainage- none, tonsils- atrophic Neck- flexible , trachea midline, no stridor , thyroid nl, carotid no bruit Chest - symmetrical excursion ,  unlabored           Heart/CV- RRR , + 2/6 Systolic murmur , no gallop  , no rub, nl s1 s2                           - JVD- none , edema- 1-2+, stasis changes- none, varices- none           Lung- clear to P&A, wheeze- none, cough- none , dullness-none, rub- none           Chest wall-  Abd- tender-no, distended-no, bowel sounds-present, HSM- no Br/ Gen/ Rectal- Not done, not indicated Extrem- cyanosis- none, clubbing, none, atrophy- none, strength- nl Neuro- grossly intact to observation

## 2011-07-26 ENCOUNTER — Encounter: Payer: Self-pay | Admitting: Internal Medicine

## 2011-07-26 NOTE — Assessment & Plan Note (Signed)
Primary mechanism is likely to be obesity hypoventilation syndrome aggravated by deconditioning.  Question significance of systolic heart murmur as an indicator of valvular heart disease. History of MI/CABG with EF about 45% predicted. I recommend weight loss, gradual, paced exercise. This was discussed with him and his wife. Knee pain is significantly limiting.

## 2011-07-26 NOTE — Assessment & Plan Note (Signed)
He asked that I manage this. We have requested his diagnostic NPSG from [facility]

## 2011-07-27 NOTE — Progress Notes (Signed)
Documentation for 6 Minute Walk Test 

## 2011-07-27 NOTE — Assessment & Plan Note (Signed)
Discussed.

## 2011-09-04 ENCOUNTER — Ambulatory Visit (INDEPENDENT_AMBULATORY_CARE_PROVIDER_SITE_OTHER): Payer: Managed Care, Other (non HMO) | Admitting: Internal Medicine

## 2011-09-04 ENCOUNTER — Encounter: Payer: Self-pay | Admitting: Internal Medicine

## 2011-09-04 VITALS — BP 140/88 | HR 76 | Ht 72.0 in | Wt 306.0 lb

## 2011-09-04 DIAGNOSIS — G4733 Obstructive sleep apnea (adult) (pediatric): Secondary | ICD-10-CM

## 2011-09-04 DIAGNOSIS — R0602 Shortness of breath: Secondary | ICD-10-CM

## 2011-09-04 NOTE — Progress Notes (Signed)
06/13/11- 54 yoM never smoker here with his wife on kind referral from Dr. Paulino Rily for complaint of dyspnea.  He had pneumonia with a flu like illness early in the year and then again in August 2012, both treated as outpatient. He had been playing tennis regularly, as recently as April or May. He had a partial colectomy in March of 2012. He had tried to ride an [employer], [employer] [facility] [employer] He had to stop that when he developed pressure soreness from the seat. He has had no regular exercise since June of 2012. He regained weight he had lost. Activity is now limited by knee pain.   He had been retaining fluid. He was evaluated for cardiology at [facility] and told his cardiac situation was stable. An echocardiogram was done and Lasix was doubled. 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 He denies exertional heart pain or palpitation now and ankle edema is controlled. There is no history of DVT or pulmonary embolism. NPSG at [facility]- dx'd OSA, treated with CPAP used all night, every night and helpful. He is asking that I address this. We will need to get his original diagnostic sleep study. Has had flu and pneumonia vaccines.  07/24/11- 65 yoM never smoker followed for Dyspnea, OSA, obesity, CAD/Hx MI . Wife here No significant change since last visit. He comes for review pulmonary function studies. PFT-07/24/2011-normal spirometry flows within significant response to bronchodilator. Diffusion mildly reduced. FEV1/FVC 0.77. 6 Minute Walk Test- 96%, 88%, 98% 432 meters. Note exertional desaturation.   09/04/11-  65 yoM never smoker followed for Dyspnea, OSA, obesity, CAD/Hx MI . Wife here Autotitration CPAP was initially too low. He is now using his old CPAP machine where we wait for the download from [place]. Original NPSG 04/27/05- AHI 16.09/hr  ROS-see HPI Constitutional:   No-   weight loss, night sweats, fevers, chills, fatigue,  lassitude. HEENT:   No-  headaches, difficulty swallowing, tooth/dental problems, sore throat,       No-  sneezing, itching, ear ache, nasal congestion, post nasal drip,  CV:  No-   chest pain, orthopnea, PND, swelling in lower extremities, anasarca, dizziness, palpitations Resp: + shortness of breath with exertion, not at rest.              No-   productive cough,  No non-productive cough,  No- coughing up of blood.              No-   change in color of mucus.  No- wheezing.   Skin: No-   rash or lesions. GI:  No-   heartburn, indigestion, abdominal pain, nausea, vomiting, diarrhea,                 change in bowel habits, loss of appetite GU: MS:  No-   joint pain or swelling.  No- decreased range of motion.  No- back pain. Neuro-     nothing unusual Psych:  No- change in mood or affect. No depression or anxiety.  No memory loss.  OBJ- General- Alert, Oriented, Affect-appropriate, Distress- none acute, very obese Skin- rash-none, lesions- none, excoriation- none Lymphadenopathy- none Head- atraumatic            Eyes- Gross vision intact, PERRLA, conjunctivae clear secretions            Ears- Hearing, canals-normal            Nose- Clear, no-Septal dev, mucus,  polyps, erosion, perforation             Throat- Mallampati IV , mucosa clear , drainage- none, tonsils- atrophic Neck- flexible , trachea midline, no stridor , thyroid nl, carotid no bruit Chest - symmetrical excursion , unlabored           Heart/CV- RRR , + 2/6 Systolic murmur , no gallop  , no rub, nl s1 s2                           - JVD- none , edema- 1-2+, stasis changes- none, varices- none           Lung- clear to P&A, wheeze- none, cough- none , dullness-none, rub- none           Chest wall-  Abd-  Br/ Gen/ Rectal- Not done, not indicated Extrem- cyanosis- none, clubbing, none, atrophy- none, strength- nl Neuro- grossly intact to observation

## 2011-09-04 NOTE — Patient Instructions (Signed)
I will order a replacement for your old CPAP machine, set at a pressure based on your recent download.   Please call if it isn't comfortable and we will look for adjustments

## 2011-09-06 NOTE — Assessment & Plan Note (Signed)
Obesity hypoventilation syndrome, deconditioning and some reduction of cardiac function with history of MI. Weight loss would be the most important therapy we can obtain but that would require a change in lifestyle he has not achieved in years.

## 2011-09-06 NOTE — Assessment & Plan Note (Signed)
Awaiting autotitration PAP for pressure recommendation. Weight loss would help. He didn't tolerate the low range of his PAP settings

## 2011-10-13 ENCOUNTER — Encounter: Payer: Self-pay | Admitting: Internal Medicine

## 2011-10-16 ENCOUNTER — Ambulatory Visit (INDEPENDENT_AMBULATORY_CARE_PROVIDER_SITE_OTHER): Payer: Managed Care, Other (non HMO) | Admitting: Internal Medicine

## 2011-10-16 ENCOUNTER — Encounter: Payer: Self-pay | Admitting: Internal Medicine

## 2011-10-16 VITALS — BP 130/82 | HR 72 | Ht 72.0 in | Wt 311.0 lb

## 2011-10-16 DIAGNOSIS — R0602 Shortness of breath: Secondary | ICD-10-CM

## 2011-10-16 DIAGNOSIS — G4733 Obstructive sleep apnea (adult) (pediatric): Secondary | ICD-10-CM

## 2011-10-16 NOTE — Patient Instructions (Signed)
Order- DME [facility]- 1) Change CPAP to fixed pressure 11 cwp           2) Replacement CPAP machine (11 cwp) , heated humidifier, mask of choice, supplies      Dx OSA   Asthma medicines-  1) maintenance "controller" - steroid plus bronchodilator-  Symbicort 80    2 puffs then rinse mouth twice daily.  Put it on your bathroom sink so you see and remember every day, whether you feel you need it that day or not.  2) "rescue" bronchodilator inhaler-   2 puffs up to 4 times daily if needed as an extra patch, on top of the Symbicort you have used that day.

## 2011-10-16 NOTE — Progress Notes (Signed)
06/13/11- 3 yoM never smoker here with his wife on kind referral from Dr. Paulino Rily for complaint of dyspnea.  He had pneumonia with a flu like illness early in the year and then again in August 2012, both treated as outpatient. He had been playing tennis regularly, as recently as April or May. He had a partial colectomy in March of 2012. He had tried to ride an [employer], [employer] He had to stop that when he developed pressure soreness from the seat. He has had no regular exercise since June of 2012. He regained weight he had lost. Activity is now limited by knee pain.   He had been retaining fluid. He was evaluated for cardiology at [facility] and told his cardiac situation was stable. An echocardiogram was done and Lasix was doubled. 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 He denies exertional heart pain or palpitation now and ankle edema is controlled. There is no history of DVT or pulmonary embolism. NPSG at [facility]- dx'd OSA, treated with CPAP used all night, every night and helpful. He is asking that I address this. We will need to get his original diagnostic sleep study. Has had flu and pneumonia vaccines.  07/24/11- 65 yoM never smoker followed for Dyspnea, OSA, obesity, CAD/Hx MI . Wife here No significant change since last visit. He comes for review pulmonary function studies. PFT-07/24/2011-normal spirometry flows within significant response to bronchodilator. Diffusion mildly reduced. FEV1/FVC 0.77. 6 Minute Walk Test- 96%, 88%, 98% 432 meters. Note exertional desaturation.   09/04/11-  65 yoM never smoker followed for Dyspnea, OSA, obesity, CAD/Hx MI . Wife here Autotitration CPAP was initially too low. He is now using his old CPAP machine where we wait for the download from [place]. Original NPSG 04/27/05- AHI 16.09/hr  10/16/11- 65 yoM never smoker followed for Dyspnea, OSA, obesity, CAD/Hx MI . Wife  here Doing well with his CPAP/ [facility]. Download indicated best pressure 11 giving good control. Compliance is excellent. Noting some painless chest tightness without palpitation. May wheeze a little. He shows me samples of Dulera 200, Symbicort 80 and Ventolin. He was unclear how to be using these and was using all on a when necessary basis.  ROS-see HPI Constitutional:   No-   weight loss, night sweats, fevers, chills, fatigue, lassitude. HEENT:   No-  headaches, difficulty swallowing, tooth/dental problems, sore throat,       No-  sneezing, itching, ear ache, nasal congestion, post nasal drip,  CV:  No-   chest pain, orthopnea, PND, swelling in lower extremities, anasarca, dizziness, palpitations Resp: + shortness of breath with exertion, + at rest.              No-   productive cough,  No non-productive cough,  No- coughing up of blood.              No-   change in color of mucus.  No- wheezing.   Skin: No-   rash or lesions. GI:  No-   heartburn, indigestion, abdominal pain, nausea, vomiting,  GU: MS:  No-   joint pain or swelling.   Neuro-     nothing unusual Psych:  No- change in mood or affect. No depression or anxiety.  No memory loss.  OBJ- General- Alert, Oriented, Affect-appropriate, Distress- none acute, very obese Skin- rash-none, lesions- none, excoriation- none. Tanned Lymphadenopathy- none Head- atraumatic  Eyes- Gross vision intact, PERRLA, conjunctivae clear secretions            Ears- Hearing, canals-normal            Nose- Clear, no-Septal dev, mucus, polyps, erosion, perforation             Throat- Mallampati IV , mucosa clear , drainage- none, tonsils- atrophic Neck- flexible , trachea midline, no stridor , thyroid nl, carotid no bruit Chest - symmetrical excursion , unlabored           Heart/CV- RRR , + 2/6 Systolic murmur , no gallop  , no rub, nl s1 s2                           - JVD- none , edema- 1-2+, stasis changes- none, varices- none            Lung- clear to P&A, wheeze- none, cough- none , dullness-none, rub- none           Chest wall-  Abd-  Br/ Gen/ Rectal- Not done, not indicated Extrem- cyanosis- none, clubbing, none, atrophy- none, strength- nl Neuro- grossly intact to observation

## 2011-10-19 NOTE — Assessment & Plan Note (Signed)
Potentially multifactorial dyspnea. We educated use of inhalers, explaining rescue versus maintenance. He is not describing a sudden event of any kind. Recognizes effect of obesity and deconditioning. Question whether his cardiac disease, including his bowel or heart disease/murmur, may be important.

## 2012-04-20 ENCOUNTER — Ambulatory Visit: Payer: Managed Care, Other (non HMO) | Admitting: Internal Medicine

## 2012-05-24 ENCOUNTER — Ambulatory Visit (INDEPENDENT_AMBULATORY_CARE_PROVIDER_SITE_OTHER): Payer: Managed Care, Other (non HMO) | Admitting: Internal Medicine

## 2012-05-24 ENCOUNTER — Encounter: Payer: Self-pay | Admitting: Internal Medicine

## 2012-05-24 VITALS — BP 122/70 | HR 63 | Ht 72.0 in | Wt 253.4 lb

## 2012-05-24 DIAGNOSIS — G4733 Obstructive sleep apnea (adult) (pediatric): Secondary | ICD-10-CM

## 2012-05-24 DIAGNOSIS — J310 Chronic rhinitis: Secondary | ICD-10-CM

## 2012-05-24 NOTE — Patient Instructions (Addendum)
Order- DME- [facility]- reduce CPAP to 10 cwp   Dx OSA  Try rinsing your nose with saline- Neti pot or squeeze bottle

## 2012-05-24 NOTE — Progress Notes (Signed)
06/13/11- 87 yoM never smoker here with his wife on kind referral from Dr. Paulino Rily for complaint of dyspnea.  He had pneumonia with a flu like illness early in the year and then again in August 2012, both treated as outpatient. He had been playing tennis regularly, as recently as April or May. He had a partial colectomy in March of 2012. He had tried to ride an [employer], [employer] at [employer] He had to stop that when he developed pressure soreness from the seat. He has had no regular exercise since June of 2012. He regained weight he had lost. Activity is now limited by knee pain.   He had been retaining fluid. He was evaluated for cardiology at [facility] and told his cardiac situation was stable. An echocardiogram was done and Lasix was doubled. 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 He denies exertional heart pain or palpitation now and ankle edema is controlled. There is no history of DVT or pulmonary embolism. NPSG at [facility]- dx'd OSA, treated with CPAP used all night, every night and helpful. He is asking that I address this. We will need to get his original diagnostic sleep study. Has had flu and pneumonia vaccines.  07/24/11- 65 yoM never smoker followed for Dyspnea, OSA, obesity, CAD/Hx MI . Wife here No significant change since last visit. He comes for review pulmonary function studies. PFT-07/24/2011-normal spirometry flows within significant response to bronchodilator. Diffusion mildly reduced. FEV1/FVC 0.77. 6 Minute Walk Test- 96%, 88%, 98% 432 meters. Note exertional desaturation.   09/04/11-  65 yoM never smoker followed for Dyspnea, OSA, obesity, CAD/Hx MI . Wife here Autotitration CPAP was initially too low. He is now using his old CPAP machine where we wait for the download from [place]. Original NPSG 04/27/05- AHI 16.09/hr  10/16/11- 65 yoM never smoker followed for Dyspnea, OSA, obesity, CAD/Hx MI . Wife  here Doing well with his CPAP/ [facility]. Download indicated best pressure 11 giving good control. Compliance is excellent. Noting some painless chest tightness without palpitation. May wheeze a little. He shows me samples of Dulera 200, Symbicort 80 and Ventolin. He was unclear how to be using these and was using all on a when necessary basis.  05/24/12- 65 yoM never smoker followed for Dyspnea, OSA, obesity, CAD/Hx MI . Wife here Wears CPAP every night for approximately 4-5 hours, pressure  may be too high-wife states that the mask makes too much noise  He still falls asleep easily if quiet but does not consider this obtrusive and has no trouble driving. Now on a Z-Pak for a sinus infection.  ROS-see HPI Constitutional:   No-   weight loss, night sweats, fevers, chills, +fatigue, lassitude. HEENT:   No-  headaches, difficulty swallowing, tooth/dental problems, sore throat,       No-  sneezing, itching, ear ache, nasal congestion, post nasal drip,  CV:  No-   chest pain, orthopnea, PND, swelling in lower extremities, anasarca, dizziness, palpitations Resp: + shortness of breath with exertion, + at rest.              No-   productive cough,  No non-productive cough,  No- coughing up of blood.              No-   change in color of mucus.  No- wheezing.   Skin: No-   rash or lesions. GI:  No-   heartburn, indigestion, abdominal pain, nausea, vomiting,  GU: MS:  No-   joint pain or swelling.   Neuro-     nothing unusual Psych:  No- change in mood or affect. No depression or anxiety.  No memory loss.  OBJ- General- Alert, Oriented, Affect-appropriate, Distress- none acute, overweight Skin- rash-none, lesions- none, excoriation- none. Tanned Lymphadenopathy- none Head- atraumatic            Eyes- Gross vision intact, PERRLA, conjunctivae clear secretions            Ears- Hearing, canals-normal            Nose- Clear, no-Septal dev, mucus, polyps, erosion, perforation             Throat-  Mallampati IV , mucosa clear , drainage- none, tonsils- atrophic Neck- flexible , trachea midline, no stridor , thyroid nl, carotid no bruit Chest - symmetrical excursion , unlabored           Heart/CV- RRR , + 2/6 Systolic murmur , no gallop  , no rub, nl s1 s2                           - JVD- none , edema- 1-2+, stasis changes- none, varices- none           Lung- clear to P&A, wheeze- none, cough- none , dullness-none, rub- none           Chest wall-  Abd-  Br/ Gen/ Rectal- Not done, not indicated Extrem- cyanosis- none, clubbing, none, atrophy- none, strength- nl Neuro- grossly intact to observation

## 2012-06-05 DIAGNOSIS — J310 Chronic rhinitis: Secondary | ICD-10-CM | POA: Insufficient documentation

## 2012-06-05 NOTE — Assessment & Plan Note (Signed)
This may be a vasomotor rhinitis from CPAP airflow but it is getting in the way. We will reduce airflow and have him use saline nasal rinse.

## 2012-06-05 NOTE — Assessment & Plan Note (Signed)
Fair compliance. It would be helped if he could use it all night. We will reduce the pressure from 11-10 to see if that reduces the airflow noise which is bothering his wife

## 2012-11-22 ENCOUNTER — Encounter: Payer: Self-pay | Admitting: Internal Medicine

## 2012-11-22 ENCOUNTER — Ambulatory Visit (INDEPENDENT_AMBULATORY_CARE_PROVIDER_SITE_OTHER): Payer: Managed Care, Other (non HMO) | Admitting: Internal Medicine

## 2012-11-22 VITALS — BP 126/70 | HR 68 | Ht 70.75 in | Wt 271.8 lb

## 2012-11-22 DIAGNOSIS — G4733 Obstructive sleep apnea (adult) (pediatric): Secondary | ICD-10-CM

## 2012-11-22 DIAGNOSIS — J310 Chronic rhinitis: Secondary | ICD-10-CM

## 2012-11-22 MED ORDER — IPRATROPIUM BROMIDE 0.03 % NA SOLN: NASAL | Status: DC

## 2012-11-22 NOTE — Progress Notes (Signed)
06/13/11- 45 yoM never smoker here with his wife on kind referral from Dr. Paulino Rily for complaint of dyspnea.  He had pneumonia with a flu like illness early in the year and then again in August 2012, both treated as outpatient. He had been playing tennis regularly, as recently as April or May. He had a partial colectomy in March of 2012. He had tried to ride an [employer], [employer] at [employer] He had to stop that when he developed pressure soreness from the seat. He has had no regular exercise since June of 2012. He regained weight he had lost. Activity is now limited by knee pain.   He had been retaining fluid. He was evaluated for cardiology at [facility] and told his cardiac situation was stable. An echocardiogram was done and Lasix was doubled. 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 He denies exertional heart pain or palpitation now and ankle edema is controlled. There is no history of DVT or pulmonary embolism. NPSG at [facility]- dx'd OSA, treated with CPAP used all night, every night and helpful. He is asking that I address this. We will need to get his original diagnostic sleep study. Has had flu and pneumonia vaccines.  07/24/11- 65 yoM never smoker followed for Dyspnea, OSA, obesity, CAD/Hx MI . Wife here No significant change since last visit. He comes for review pulmonary function studies. PFT-07/24/2011-normal spirometry flows within significant response to bronchodilator. Diffusion mildly reduced. FEV1/FVC 0.77. 6 Minute Walk Test- 96%, 88%, 98% 432 meters. Note exertional desaturation.   09/04/11-  65 yoM never smoker followed for Dyspnea, OSA, obesity, CAD/Hx MI . Wife here Autotitration CPAP was initially too low. He is now using his old CPAP machine where we wait for the download from [place]. Original NPSG 04/27/05- AHI 16.09/hr  10/16/11- 65 yoM never smoker followed for Dyspnea, OSA, obesity, CAD/Hx MI . Wife  here Doing well with his CPAP/ [facility]. Download indicated best pressure 11 giving good control. Compliance is excellent. Noting some painless chest tightness without palpitation. May wheeze a little. He shows me samples of Dulera 200, Symbicort 80 and Ventolin. He was unclear how to be using these and was using all on a when necessary basis.  05/24/12- 65 yoM never smoker followed for Dyspnea, OSA, obesity, CAD/Hx MI . Wife here Wears CPAP every night for approximately 4-5 hours, pressure  may be too high-wife states that the mask makes too much noise  He still falls asleep easily if quiet but does not consider this obtrusive and has no trouble driving. Now on a Z-Pak for a sinus infection.  11/22/12- 66 yoM never smoker followed for Dyspnea, OSA, obesity, CAD/Hx MI . Wife here FOLLOWS FOR: wears CPAP 10/ [facility] every night for about 6 hours and pressure doing well for patient. He and his wife are satisfied with CPAP. Wife still thinks his breathing is "noisy". Recent nasal congestion from a cold with postnasal drip. We discussed ability to reduce the pressure a little bit more if needed.He has not lost weight.  ROS-see HPI Constitutional:   No-   weight loss, night sweats, fevers, chills, +fatigue, lassitude. HEENT:   No-  headaches, difficulty swallowing, tooth/dental problems, sore throat,       No-  sneezing, itching, ear ache, nasal congestion, +post nasal drip,  CV:  No-   chest pain, orthopnea, PND, swelling in lower extremities, anasarca, dizziness, palpitations Resp: + shortness of breath with exertion, +  at rest.              No-   productive cough,  No non-productive cough,  No- coughing up of blood.              No-   change in color of mucus.  No- wheezing.   Skin: No-   rash or lesions. GI:  No-   heartburn, indigestion, abdominal pain, nausea, vomiting,  GU: MS:  +  joint pain or swelling.   Neuro-     nothing unusual Psych:  No- change in mood or affect. No depression or  anxiety.  No memory loss.  OBJ- General- Alert, Oriented, Affect-appropriate, Distress- none acute, overweight Skin- rash-none, lesions- none, excoriation- none. Tanned Lymphadenopathy- none Head- atraumatic            Eyes- Gross vision intact, PERRLA, conjunctivae clear secretions            Ears- Hearing, canals-normal            Nose- Clear, no-Septal dev, mucus, polyps, erosion, perforation             Throat- Mallampati IV , mucosa clear , drainage- none, tonsils- atrophic Neck- flexible , trachea midline, no stridor , thyroid nl, carotid no bruit Chest - symmetrical excursion , unlabored           Heart/CV- RRR , + 2/6 Systolic murmur , no gallop  , no rub, nl s1 s2                           - JVD- none , edema- 1+, stasis changes- none, varices- none           Lung- clear to P&A, wheeze- none, cough- none , dullness-none, rub- none           Chest wall-  Abd-  Br/ Gen/ Rectal- Not done, not indicated Extrem- cyanosis- none, clubbing, none, atrophy- none, strength- nl Neuro- grossly intact to observation

## 2012-11-22 NOTE — Patient Instructions (Addendum)
We can continue CPAP at 10, but if you decide to try one more step lower just let me know.  Script for Ipratropium nasal spray to see if it reduces the nasal congestion after cold drinks  Consider trying Breathe Right nasal strips under your CPAP mask for stuffiness  Please call as needed

## 2012-11-29 ENCOUNTER — Encounter: Payer: Self-pay | Admitting: Internal Medicine

## 2012-11-29 NOTE — Assessment & Plan Note (Signed)
We are going to leave CPAP at current pressure of 10 for now.. Consider nasal strips

## 2012-11-29 NOTE — Assessment & Plan Note (Signed)
Tried ipratropium nasal spray to reduce drainage

## 2013-06-08 ENCOUNTER — Encounter: Payer: Self-pay | Admitting: Cardiology

## 2013-09-14 ENCOUNTER — Ambulatory Visit: Payer: Managed Care, Other (non HMO) | Admitting: Cardiology

## 2013-09-15 ENCOUNTER — Ambulatory Visit (INDEPENDENT_AMBULATORY_CARE_PROVIDER_SITE_OTHER): Payer: Managed Care, Other (non HMO) | Admitting: Cardiology

## 2013-09-15 ENCOUNTER — Encounter (INDEPENDENT_AMBULATORY_CARE_PROVIDER_SITE_OTHER): Payer: Self-pay

## 2013-09-15 ENCOUNTER — Encounter: Payer: Self-pay | Admitting: Cardiology

## 2013-09-15 VITALS — BP 136/80 | HR 67 | Ht 70.75 in | Wt 294.0 lb

## 2013-09-15 DIAGNOSIS — I77810 Thoracic aortic ectasia: Secondary | ICD-10-CM

## 2013-09-15 DIAGNOSIS — M4800 Spinal stenosis, site unspecified: Secondary | ICD-10-CM

## 2013-09-15 DIAGNOSIS — I359 Nonrheumatic aortic valve disorder, unspecified: Secondary | ICD-10-CM

## 2013-09-15 DIAGNOSIS — I2510 Atherosclerotic heart disease of native coronary artery without angina pectoris: Secondary | ICD-10-CM

## 2013-09-15 DIAGNOSIS — I350 Nonrheumatic aortic (valve) stenosis: Secondary | ICD-10-CM | POA: Insufficient documentation

## 2013-09-15 DIAGNOSIS — E785 Hyperlipidemia, unspecified: Secondary | ICD-10-CM | POA: Insufficient documentation

## 2013-09-15 NOTE — Progress Notes (Signed)
[facility]. [address], [address] [place], [place]  [address] Phone: [phone] Fax:  [phone]  Date:  09/15/2013   ID:  Paul Bryant, DOB July 16, 1946, MRN [id number]  PCP:  Lilian Coma, MD   History of Present Illness: Paul Bryant is a 68 y.o. male with hx of coronary artery disease status post myocardial infarction at age 23 originally treated by Dr. Melvern Banker first treated with POBA then eventually CABG at 46 with hypertension, hyperlipidemia, obesity. He underwent a nuclear stress test on 06/17/10 which showed an ejection fraction of 46%, inferior wall infarction of moderate sized, very small degree of peri-infarct ischemia in this distribution.   I am very happy about his 60 pound weight loss. No carbohydrates, decreasing caloric intake. Excellent. He states however that he does not feel like it is increased his energy. his wife stated today that he did have some chest discomfort, chest tightness or pressure described the other night that was concerning. No radiation of symptoms. Moderate in severity. He has been experiencing dyspnea on exertion on a continuous basis.  03/14/13 - he has gained back some weight. Occasional dizziness. At work, diastolic 60 at one point. He is still taking lasix. Occasional incontinence. Main complaint is joint pain. Spinal stenosis. Hand. Has [employer] but does not go. .  Patient denies chest pain, palpitations, dizziness, syncope, nor PND  09/15/13- still struggling with weight. No change in symptoms, no shortness of breath, no chest pain. We had lengthy discussion today about health care. Compliant with his medications. Last LDL 72. Crestor 40. Wife asked about need for repeat catheterization. Since he is having no change in symptoms, we will continue to monitor clinically with aggressive secondary risk factor prevention. Stress test has been done in the past.    Wt Readings from Last 3 Encounters:  09/15/13 294 lb (133.358 kg)    11/22/12 271 lb 12.8 oz (123.288 kg)  05/24/12 253 lb 6.4 oz (114.941 kg)     Past Medical History  Diagnosis Date  . Diverticulitis oct. 2011  . Heart attack   . Hypertension   . IBS (irritable bowel syndrome)   . Dizziness   . CAD (coronary artery disease)   . RBBB (right bundle branch block with left anterior fascicular block)   . Gout   . Neuropathy   . Obesity, morbid   . Hernia     umbilical and ventral  . COPD (chronic obstructive pulmonary disease)     mild  . Urolithiasis     Past Surgical History  Procedure Laterality Date  . Open heart bypass  aug. 1998  . Quadruple bypass    . Lap sigmoid colectomy due to persistent    . Umbilical hernia repair      right proctoscopy  . Ureteral stent placement  09-18-2010    Dr Diona Fanti    Current Outpatient Prescriptions  Medication Sig Dispense Refill  . ALPRAZolam (XANAX) 0.25 MG tablet Take 0.25 mg by mouth 2 (two) times daily as needed.        [name] aspirin 81 MG tablet Take 81 mg by mouth daily.        . bisoprolol (ZEBETA) 10 MG tablet Take 10 mg by mouth 2 (two) times daily.        . furosemide (LASIX) 20 MG tablet Take 40 mg by mouth 2 (two) times daily.       . irbesartan (AVAPRO) 75 MG tablet Take 75 mg  by mouth at bedtime.      [name] LORazepam (ATIVAN) 1 MG tablet       . nitroGLYCERIN (NITROSTAT) 0.4 MG SL tablet Place 0.4 mg under the tongue every 5 (five) minutes as needed.        . rosuvastatin (CRESTOR) 40 MG tablet Take 40 mg by mouth daily.        . traMADol (ULTRAM) 50 MG tablet       . trimethoprim-polymyxin b (POLYTRIM) ophthalmic solution        No current facility-administered medications for this visit.    Allergies:    Allergies  Allergen Reactions  . Penicillins Rash    hives    Social History:  The patient  reports that he has never smoked. His smokeless tobacco use includes Snuff. He reports that he drinks alcohol. He reports that he does not use illicit drugs.   ROS:  Please see the  history of present illness.   Denies any fevers, chills, orthopnea, PND    PHYSICAL EXAM: VS:  BP 136/80  Pulse 67  Ht 5' 10.75" (1.797 m)  Wt 294 lb (133.358 kg)  BMI 41.30 kg/m2 Well nourished, well developed, in no acute distress HEENT: normal Neck: no JVD Cardiac:  normal S1, S2; RRR; 3/6 musical murmurRight upper sternal border Lungs:  clear to auscultation bilaterally, no wheezing, rhonchi or rales Abd: soft, nontender, no hepatomegaly Ext: no edema Skin: warm and dry Neuro: no focal abnormalities noted  EKG: 09/15/13 Sinus rhythm, right bundle branch block, 67     ASSESSMENT AND PLAN:  1. CAD-post bypass. Currently doing well with no anginal symptoms. 2. Morbid obesity-continue to encourage weight loss. 3. Hyperlipidemia-LDL 72 November of 2014. Crestor 40. LDL goal 70. 4. 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 We will repeat echocardiogram. 5. Mild aortic stenosis-checking echo. 6. Hypertension-currently well controlled on multidrug regimen. 7. Spinal stenosis-currently considering seeing a neurosurgeon. Per Dr. Stephanie Acre.  Signed, Candee Furbish, MD [facility]  09/15/2013 2:10 PM

## 2013-09-15 NOTE — Patient Instructions (Signed)
Your physician has requested that you have an echocardiogram. IN [facility] Echocardiography is a painless test that uses sound waves to create images of your heart. It provides your doctor with information about the size and shape of your heart and how well your heart's chambers and valves are working. This procedure takes approximately one hour. There are no restrictions for this procedure.  Your physician wants you to follow-up in: 6 months You will receive a reminder letter in the mail two months in advance. If you don't receive a letter, please call our office to schedule the follow-up appointment.  Your physician recommends that you continue on your current medications as directed. Please refer to the Current Medication list given to you today.

## 2013-09-23 ENCOUNTER — Other Ambulatory Visit (HOSPITAL_COMMUNITY): Payer: Managed Care, Other (non HMO)

## 2013-10-21 ENCOUNTER — Ambulatory Visit (HOSPITAL_COMMUNITY): Payer: Managed Care, Other (non HMO) | Attending: Cardiology | Admitting: Radiology

## 2013-10-21 ENCOUNTER — Encounter: Payer: Self-pay | Admitting: Cardiology

## 2013-10-21 ENCOUNTER — Other Ambulatory Visit (HOSPITAL_COMMUNITY): Payer: Managed Care, Other (non HMO) | Admitting: Radiology

## 2013-10-21 DIAGNOSIS — I359 Nonrheumatic aortic valve disorder, unspecified: Secondary | ICD-10-CM

## 2013-10-21 DIAGNOSIS — I25810 Atherosclerosis of coronary artery bypass graft(s) without angina pectoris: Secondary | ICD-10-CM

## 2013-10-21 DIAGNOSIS — R072 Precordial pain: Secondary | ICD-10-CM

## 2013-10-21 DIAGNOSIS — I2510 Atherosclerotic heart disease of native coronary artery without angina pectoris: Secondary | ICD-10-CM

## 2013-10-21 MED ORDER — PERFLUTREN PROTEIN A MICROSPH IV SUSP
3.0000 mL | Freq: Once | INTRAVENOUS | Status: AC
  Administered 2013-10-21: 3 mL via INTRAVENOUS

## 2013-10-21 NOTE — Progress Notes (Signed)
Echocardiogram with contrast was performed.

## 2014-02-21 ENCOUNTER — Ambulatory Visit (INDEPENDENT_AMBULATORY_CARE_PROVIDER_SITE_OTHER): Payer: Managed Care, Other (non HMO) | Admitting: Internal Medicine

## 2014-02-21 ENCOUNTER — Encounter: Payer: Self-pay | Admitting: Internal Medicine

## 2014-02-21 VITALS — BP 122/64 | HR 68 | Ht 70.75 in | Wt 302.4 lb

## 2014-02-21 DIAGNOSIS — J310 Chronic rhinitis: Secondary | ICD-10-CM

## 2014-02-21 DIAGNOSIS — R0602 Shortness of breath: Secondary | ICD-10-CM

## 2014-02-21 DIAGNOSIS — G4733 Obstructive sleep apnea (adult) (pediatric): Secondary | ICD-10-CM

## 2014-02-21 MED ORDER — ALBUTEROL SULFATE HFA 108 (90 BASE) MCG/ACT IN AERS: INHALATION_SPRAY | RESPIRATORY_TRACT | Status: DC

## 2014-02-21 MED ORDER — BUDESONIDE-FORMOTEROL FUMARATE 80-4.5 MCG/ACT IN AERO: INHALATION_SPRAY | RESPIRATORY_TRACT | Status: DC

## 2014-02-21 NOTE — Progress Notes (Signed)
06/13/11- 38 yoM never smoker here with his wife on kind referral from Dr. Stephanie Acre for complaint of dyspnea.  He had pneumonia with a flu like illness early in the year and then again in August 2012, both treated as outpatient. He had been playing tennis regularly, as recently as April or May. He had a partial colectomy in March of 2012. He had tried to ride an [employer] at the [employer] He had to stop that when he developed pressure soreness from the seat. He has had no regular exercise since June of 2012. He regained weight he had lost. Activity is now limited by knee pain.   He had been retaining fluid. He was evaluated for cardiology at [facility] and told his cardiac situation was stable. An echocardiogram was done and Lasix was doubled. 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 He denies exertional heart pain or palpitation now and ankle edema is controlled. There is no history of DVT or pulmonary embolism. NPSG at [place]- dx'd OSA, treated with CPAP used all night, every night and helpful. He is asking that I address this. We will need to get his original diagnostic sleep study. Has had flu and pneumonia vaccines.  07/24/11- 78 yoM never smoker followed for Dyspnea, OSA, obesity, CAD/Hx MI . Wife here No significant change since last visit. He comes for review pulmonary function studies. PFT-07/24/2011-normal spirometry flows within significant response to bronchodilator. Diffusion mildly reduced. FEV1/FVC 0.77. 6 Minute Walk Test- 96%, 88%, 98% 432 meters. Note exertional desaturation.   09/04/11-  55 yoM never smoker followed for Dyspnea, OSA, obesity, CAD/Hx MI . Wife here Autotitration CPAP was initially too low. He is now using his old CPAP machine where we wait for the download from [place]. Original NPSG 04/27/05- AHI 16.09/hr  10/16/11- 18 yoM never smoker followed for Dyspnea, OSA, obesity, CAD/Hx MI . Wife  here Doing well with his CPAP/ [facility]. Download indicated best pressure 11 giving good control. Compliance is excellent. Noting some painless chest tightness without palpitation. May wheeze a little. He shows me samples of Dulera 200, Symbicort 80 and Ventolin. He was unclear how to be using these and was using all on a when necessary basis.  05/24/12- 35 yoM never smoker followed for Dyspnea, OSA, obesity, CAD/Hx MI . Wife here Wears CPAP every night for approximately 4-5 hours, pressure  may be too high-wife states that the mask makes too much noise  He still falls asleep easily if quiet but does not consider this obtrusive and has no trouble driving. Now on a Z-Pak for a sinus infection.  11/22/12- 66 yoM never smoker followed for Dyspnea, OSA, obesity, CAD/Hx MI . Wife here FOLLOWS FOR: wears CPAP 10/ [facility] every night for about 6 hours and pressure doing well for patient. He and his wife are satisfied with CPAP. Wife still thinks his breathing is "noisy". Recent nasal congestion from a cold with postnasal drip. We discussed ability to reduce the pressure a little bit more if needed.He has not lost weight.  02/21/14- 67 yoM never smoker followed for Dyspnea, OSA, obesity, CAD/Hx MI . Wife here FOLLOWS FOR: Wears CPAP 10/ [facility] every night for about 5-6 hours; unsure if pressure is okay-making funny noises;  NPSG 04/27/05 Moderate OSA, AHI 16/ hr, weight 290 lbs. Some wheeze, cough, dyspnea on exertion. Occasionally uses rescue inhaler. No recent change or acute event.  ROS-see HPI Constitutional:   No-  weight loss, night sweats, fevers, chills, +fatigue, lassitude. HEENT:   No-  headaches, difficulty swallowing, tooth/dental problems, sore throat,       No-  sneezing, itching, ear ache, nasal congestion, +post nasal drip,  CV:  No-   chest pain, orthopnea, PND, swelling in lower extremities, anasarca, dizziness, palpitations Resp: + shortness of breath with exertion, + at rest.               No-   productive cough,  No non-productive cough,  No- coughing up of blood.              No-   change in color of mucus.  No- wheezing.   Skin: No-   rash or lesions. GI:  No-   heartburn, indigestion, abdominal pain, nausea, vomiting,  GU: MS:  +  joint pain or swelling.   Neuro-     nothing unusual Psych:  No- change in mood or affect. No depression or anxiety.  No memory loss.  OBJ- General- Alert, Oriented, Affect-appropriate, Distress- none acute, overweight Skin- rash-none, lesions- none, excoriation- none. Tanned Lymphadenopathy- none Head- atraumatic            Eyes- Gross vision intact, PERRLA, conjunctivae clear secretions            Ears- Hearing, canals-normal            Nose- Clear, no-Septal dev, mucus, polyps, erosion, perforation             Throat- Mallampati IV , mucosa clear , drainage- none, tonsils- atrophic Neck- flexible , trachea midline, no stridor , thyroid nl, carotid no bruit Chest - symmetrical excursion , unlabored           Heart/CV- RRR , + 2/6 Systolic murmur , no gallop  , no rub, nl s1 s2                           - JVD- none , edema- 1+, stasis changes- none, varices- none           Lung- clear to P&A, wheeze- none, cough- none , dullness-none, rub- none           Chest wall-  Abd-  Br/ Gen/ Rectal- Not done, not indicated Extrem- cyanosis- none, clubbing, none, atrophy- none, strength- nl Neuro- grossly intact to observation

## 2014-02-21 NOTE — Patient Instructions (Addendum)
Script sent for refill rescue inhaler  Script sent for Symbicort  Order- DME [facility]- needs CPAP service for funny noise, and needs help adjusting Ramp for comfort  Dx OSA  Suggest trying otc nasal spray Nasalcrom/ cromol/ cromolyn- non-steroidal allergy spray

## 2014-02-27 NOTE — Assessment & Plan Note (Signed)
Compliance and control are good. He uses CPAP every night, all night and it definitely helps him. He needs to get the machine serviced and he needs help with the Ramp feature.

## 2014-02-27 NOTE — Assessment & Plan Note (Signed)
No acute change. Mild obstruction and a component of obesity with deconditioning. Uncertain cardiac limitation but he denies angina. Plan-refill Ventolin and Symbicort

## 2014-02-27 NOTE — Assessment & Plan Note (Signed)
Plan-suggest trial of Nasalcrom to avoid epistaxis from steroid nasal sprays

## 2014-04-12 ENCOUNTER — Other Ambulatory Visit: Payer: Self-pay | Admitting: Family Medicine

## 2014-04-12 ENCOUNTER — Ambulatory Visit
Admission: RE | Admit: 2014-04-12 | Discharge: 2014-04-12 | Disposition: A | Discharge status: Home / Self Care | Payer: Managed Care, Other (non HMO) | Source: Ambulatory Visit | Attending: Family Medicine | Admitting: Family Medicine

## 2014-04-12 DIAGNOSIS — M79672 Pain in left foot: Secondary | ICD-10-CM

## 2014-04-20 ENCOUNTER — Other Ambulatory Visit: Payer: Self-pay | Admitting: Family Medicine

## 2014-04-20 DIAGNOSIS — M79605 Pain in left leg: Secondary | ICD-10-CM

## 2014-04-24 ENCOUNTER — Other Ambulatory Visit: Payer: Self-pay | Admitting: *Deleted

## 2014-04-24 MED ORDER — IRBESARTAN 75 MG PO TABS: 75.0000 mg | ORAL_TABLET | Freq: Every day | ORAL | Status: DC

## 2014-04-24 MED ORDER — ROSUVASTATIN CALCIUM 40 MG PO TABS: 40.0000 mg | ORAL_TABLET | Freq: Every day | ORAL | Status: DC

## 2014-04-24 MED ORDER — FUROSEMIDE 20 MG PO TABS: 40.0000 mg | ORAL_TABLET | Freq: Two times a day (BID) | ORAL | Status: DC

## 2014-04-26 ENCOUNTER — Ambulatory Visit (INDEPENDENT_AMBULATORY_CARE_PROVIDER_SITE_OTHER): Payer: Managed Care, Other (non HMO) | Admitting: Cardiology

## 2014-04-26 ENCOUNTER — Ambulatory Visit
Admission: RE | Admit: 2014-04-26 | Discharge: 2014-04-26 | Disposition: A | Discharge status: Home / Self Care | Payer: Managed Care, Other (non HMO) | Source: Ambulatory Visit | Attending: Family Medicine | Admitting: Family Medicine

## 2014-04-26 ENCOUNTER — Encounter: Payer: Self-pay | Admitting: Cardiology

## 2014-04-26 VITALS — BP 140/84 | HR 77 | Ht 72.0 in | Wt 306.0 lb

## 2014-04-26 DIAGNOSIS — G4733 Obstructive sleep apnea (adult) (pediatric): Secondary | ICD-10-CM

## 2014-04-26 DIAGNOSIS — M79605 Pain in left leg: Secondary | ICD-10-CM

## 2014-04-26 DIAGNOSIS — I77810 Thoracic aortic ectasia: Secondary | ICD-10-CM

## 2014-04-26 DIAGNOSIS — I359 Nonrheumatic aortic valve disorder, unspecified: Secondary | ICD-10-CM

## 2014-04-26 DIAGNOSIS — I2510 Atherosclerotic heart disease of native coronary artery without angina pectoris: Secondary | ICD-10-CM

## 2014-04-26 DIAGNOSIS — I350 Nonrheumatic aortic (valve) stenosis: Secondary | ICD-10-CM

## 2014-04-26 MED ORDER — FUROSEMIDE 20 MG PO TABS: 40.0000 mg | ORAL_TABLET | Freq: Two times a day (BID) | ORAL | Status: DC

## 2014-04-26 MED ORDER — IRBESARTAN 75 MG PO TABS: 75.0000 mg | ORAL_TABLET | Freq: Every day | ORAL | Status: DC

## 2014-04-26 MED ORDER — BISOPROLOL FUMARATE 10 MG PO TABS: 10.0000 mg | ORAL_TABLET | Freq: Two times a day (BID) | ORAL | Status: DC

## 2014-04-26 MED ORDER — NITROGLYCERIN 0.4 MG SL SUBL: 0.4000 mg | SUBLINGUAL_TABLET | SUBLINGUAL | Status: DC | PRN

## 2014-04-26 MED ORDER — ROSUVASTATIN CALCIUM 40 MG PO TABS: 40.0000 mg | ORAL_TABLET | Freq: Every day | ORAL | Status: DC

## 2014-04-26 NOTE — Patient Instructions (Signed)
The current medical regimen is effective;  continue present plan and medications.  Follow up in 6 months with Dr. Skains.  You will receive a letter in the mail 2 months before you are due.  Please call us when you receive this letter to schedule your follow up appointment.  

## 2014-04-26 NOTE — Progress Notes (Signed)
[facility]. [address], [address] [place], [place]  [address] Phone: [phone] Fax:  [phone]  Date:  04/26/2014   ID:  Paul Bryant, DOB January 15, 1946, MRN [id number]  PCP:  Lilian Coma, MD   History of Present Illness: Paul Bryant is a 68 y.o. male with hx of coronary artery disease status post myocardial infarction at age 27 originally treated by Dr. Melvern Banker first treated with POBA then eventually CABG at 35 with hypertension, hyperlipidemia, obesity. He underwent a nuclear stress test on 06/17/10 which showed an ejection fraction of 46%, inferior wall infarction of moderate sized, very small degree of peri-infarct ischemia in this distribution.   Last LDL 72. Crestor 40. Wife asked previously about need for repeat catheterization. Since he is having no change in symptoms, we will continue to monitor clinically with aggressive secondary risk factor prevention. Stress test has been done in the past.  Weight loss has been an issue. This is contributing to his orthopedic issues as well. Orthopedic issues seem to be his main complaint.  Having Doppler of left lower extremity after injury to ensure that he does not have any signs of DVT. This has been ordered by Dr. Stephanie Acre. I have asked him to have her send the report to me.      Wt Readings from Last 3 Encounters:  04/26/14 306 lb (138.801 kg)  02/21/14 302 lb 6.4 oz (137.168 kg)  09/15/13 294 lb (133.358 kg)     Past Medical History  Diagnosis Date  . Diverticulitis oct. 2011  . Heart attack   . Hypertension   . IBS (irritable bowel syndrome)   . Dizziness   . CAD (coronary artery disease)   . RBBB (right bundle branch block with left anterior fascicular block)   . Gout   . Neuropathy   . Obesity, morbid   . Hernia     umbilical and ventral  . COPD (chronic obstructive pulmonary disease)     mild  . Urolithiasis   . OSA on CPAP   . Hyperlipidemia     Past Surgical History  Procedure Laterality Date   . Open heart bypass  aug. 1998  . Quadruple bypass    . Lap sigmoid colectomy due to persistent    . Umbilical hernia repair      right proctoscopy  . Ureteral stent placement  09-18-2010    Dr Diona Fanti    Current Outpatient Prescriptions  Medication Sig Dispense Refill  . albuterol (PROVENTIL HFA) 108 (90 BASE) MCG/ACT inhaler 2 puffs every 4 hours as needed  2 Inhaler  prn  . ALPRAZolam (XANAX) 0.25 MG tablet Take 0.25 mg by mouth 2 (two) times daily as needed.        [name] aspirin 81 MG tablet Take 81 mg by mouth daily.        . bisoprolol (ZEBETA) 10 MG tablet Take 10 mg by mouth 2 (two) times daily.        . budesonide-formoterol (SYMBICORT) 80-4.5 MCG/ACT inhaler 2 puffs then rinse mouth, twice daily  1 Inhaler  12  . furosemide (LASIX) 20 MG tablet Take 2 tablets (40 mg total) by mouth 2 (two) times daily.  120 tablet  0  . irbesartan (AVAPRO) 75 MG tablet Take 1 tablet (75 mg total) by mouth at bedtime.  30 tablet  0  . nitroGLYCERIN (NITROSTAT) 0.4 MG SL tablet Place 0.4 mg under the tongue every 5 (five) minutes as needed.        [name]  rosuvastatin (CRESTOR) 40 MG tablet Take 1 tablet (40 mg total) by mouth daily.  30 tablet  0  . traMADol (ULTRAM) 50 MG tablet        No current facility-administered medications for this visit.    Allergies:    Allergies  Allergen Reactions  . Penicillins Rash    hives    Social History:  The patient  reports that he has never smoked. His smokeless tobacco use includes Snuff. He reports that he drinks alcohol. He reports that he does not use illicit drugs.   ROS:  Please see the history of present illness.   Denies any fevers, chills, orthopnea, PND    PHYSICAL EXAM: VS:  BP 140/84  Pulse 77  Ht 6' (1.829 m)  Wt 306 lb (138.801 kg)  BMI 41.49 kg/m2 Well nourished, well developed, in no acute distress HEENT: normal Neck: no JVD Cardiac:  normal S1, S2; RRR; 3/6 musical murmurRight upper sternal border Lungs:  clear to auscultation  bilaterally, no wheezing, rhonchi or rales Abd: soft, nontender, no hepatomegalyObese Ext: chronic minor edema Skin: warm and dry Neuro: no focal abnormalities noted  EKG: 09/15/13 Sinus rhythm, right bundle branch block, 67     ASSESSMENT AND PLAN:  1. CAD-post bypass. Currently doing well with no anginal symptoms. 2. Morbid obesity-continue to encourage weight loss. 3. Hyperlipidemia-LDL 72 November of 2014. Crestor 40. LDL goal 70. 4. 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 Echocardiogram stable last showing 4.4 cm, will likely repeat in 2 years. 5. Mild aortic stenosis-no significant change. 6. Hypertension-currently well controlled on multidrug regimen. 7. Spinal stenosis-currently Injections.  8. 6 month f/u  Signed, Candee Furbish, MD [facility]  04/26/2014 8:37 AM

## 2014-06-14 ENCOUNTER — Encounter: Payer: Self-pay | Admitting: Cardiology

## 2014-06-23 ENCOUNTER — Other Ambulatory Visit: Payer: Self-pay | Admitting: Cardiology

## 2014-10-25 ENCOUNTER — Ambulatory Visit (INDEPENDENT_AMBULATORY_CARE_PROVIDER_SITE_OTHER): Payer: Managed Care, Other (non HMO) | Admitting: Cardiology

## 2014-10-25 ENCOUNTER — Encounter: Payer: Self-pay | Admitting: Cardiology

## 2014-10-25 VITALS — BP 132/84 | HR 76 | Ht 72.0 in | Wt 304.0 lb

## 2014-10-25 DIAGNOSIS — I2510 Atherosclerotic heart disease of native coronary artery without angina pectoris: Secondary | ICD-10-CM | POA: Diagnosis not present

## 2014-10-25 DIAGNOSIS — I77810 Thoracic aortic ectasia: Secondary | ICD-10-CM

## 2014-10-25 DIAGNOSIS — E785 Hyperlipidemia, unspecified: Secondary | ICD-10-CM | POA: Diagnosis not present

## 2014-10-25 NOTE — Patient Instructions (Signed)
Your physician recommends that you continue on your current medications as directed. Please refer to the Current Medication list given to you today.  Your physician wants you to follow-up in: 6 months with Dr. Skains. You will receive a reminder letter in the mail two months in advance. If you don't receive a letter, please call our office to schedule the follow-up appointment.  

## 2014-10-25 NOTE — Progress Notes (Signed)
[facility]. [address], [address], [place]  [address] Phone: [phone] Fax:  [phone]  Date:  10/25/2014   ID:  CYPRESS FANFAN, DOB 17-May-1946, MRN [id number]  PCP:  Lilian Coma, MD   History of Present Illness: Paul Bryant is a 69 y.o. male with hx of coronary artery disease status post myocardial infarction at age 70 originally treated by Dr. Melvern Banker first treated with POBA then eventually CABG at 72 with hypertension, hyperlipidemia, obesity. He underwent a nuclear stress test on 06/17/10 which showed an ejection fraction of 46%, inferior wall infarction of moderate sized, very small degree of peri-infarct ischemia in this distribution.   LDL 72. Crestor 40. Wife asked previously about need for repeat catheterization. Since he is having no change in symptoms, we will continue to monitor clinically with aggressive secondary risk factor prevention. Stress test has been done in the past.  Weight loss has been an issue. This is contributing to his orthopedic issues as well. Orthopedic issues seem to be his main complaint.  Doppler of left lower extremity after injury to ensure that he does not have any signs of DVT was negative on 04/26/14.   Sees Dr. [name].     Wt Readings from Last 3 Encounters:  10/25/14 304 lb (137.893 kg)  04/26/14 306 lb (138.801 kg)  02/21/14 302 lb 6.4 oz (137.168 kg)     Past Medical History  Diagnosis Date  . Diverticulitis oct. 2011  . Heart attack   . Hypertension   . IBS (irritable bowel syndrome)   . Dizziness   . CAD (coronary artery disease)   . RBBB (right bundle branch block with left anterior fascicular block)   . Gout   . Neuropathy   . Obesity, morbid   . Hernia     umbilical and ventral  . COPD (chronic obstructive pulmonary disease)     mild  . Urolithiasis   . OSA on CPAP   . Hyperlipidemia     Past Surgical History  Procedure Laterality Date  . Open heart bypass  aug. 1998  . Quadruple bypass    .  Lap sigmoid colectomy due to persistent    . Umbilical hernia repair      right proctoscopy  . Ureteral stent placement  09-18-2010    Dr Diona Fanti    Current Outpatient Prescriptions  Medication Sig Dispense Refill  . albuterol (PROVENTIL HFA) 108 (90 BASE) MCG/ACT inhaler 2 puffs every 4 hours as needed 2 Inhaler prn  . ALPRAZolam (XANAX) 0.25 MG tablet Take 0.25 mg by mouth 2 (two) times daily as needed.      [name] aspirin 81 MG tablet Take 81 mg by mouth daily.      . bisoprolol (ZEBETA) 10 MG tablet Take 1 tablet (10 mg total) by mouth 2 (two) times daily. 60 tablet 11  . budesonide-formoterol (SYMBICORT) 80-4.5 MCG/ACT inhaler 2 puffs then rinse mouth, twice daily 1 Inhaler 12  . furosemide (LASIX) 20 MG tablet Take 2 tablets (40 mg total) by mouth 2 (two) times daily. 120 tablet 11  . gabapentin (NEURONTIN) 100 MG capsule Take 100 mg by mouth daily.   5  . irbesartan (AVAPRO) 75 MG tablet Take 1 tablet (75 mg total) by mouth at bedtime. 30 tablet 11  . metFORMIN (GLUCOPHAGE) 500 MG tablet Take 500 mg by mouth 2 (two) times daily.     . nitroGLYCERIN (NITROSTAT) 0.4 MG SL tablet Place 1 tablet (  0.4 mg total) under the tongue every 5 (five) minutes as needed. 25 tablet prn  . rosuvastatin (CRESTOR) 40 MG tablet Take 1 tablet (40 mg total) by mouth daily. 30 tablet 11   No current facility-administered medications for this visit.    Allergies:    Allergies  Allergen Reactions  . Penicillins Rash    hives    Social History:  The patient  reports that he has never smoked. His smokeless tobacco use includes Snuff. He reports that he drinks alcohol. He reports that he does not use illicit drugs.   ROS:  Please see the history of present illness. Chronic leg swelling, shortness of breath with activity, back pain, muscle pain, knee pain, sweating  Denies any fevers, chills, orthopnea, PND    PHYSICAL EXAM: VS:  BP 132/84 mmHg  Pulse 76  Ht 6' (1.829 m)  Wt 304 lb (137.893 kg)  BMI  41.22 kg/m2 Well nourished, well developed, in no acute distress HEENT: normal Neck: no JVD Cardiac:  normal S1, S2; RRR; 3/6 musical murmurRight upper sternal border Lungs:  clear to auscultation bilaterally, no wheezing, rhonchi or rales Abd: soft, nontender, no hepatomegalyObese Ext: chronic minor edemaright > left.  Skin: warm and dry Neuro: no focal abnormalities noted  EKG: 09/15/13 Sinus rhythm, right bundle branch block, 67     ECHO: 10/21/13 - Left ventricle: The cavity size was normal. There was moderate concentric hypertrophy. Systolic function was normal. The estimated ejection fraction was in the range of 50% to 55%. Wall motion was normal; there were no regional wall motion abnormalities. Doppler parameters are consistent with abnormal left ventricular relaxation (grade 1 diastolic dysfunction). Doppler parameters are consistent with elevated ventricular end-diastolic filling pressure. - Aortic valve: Cusp separation was moderately reduced. There was mild to moderate stenosis. Mild regurgitation. Mean gradient: 18 mm Hg (S). Valve area: 1.72cm^2(VTI). Valve area: 1.52cm^2 (Vmax). - Aorta: Dilated aortic root measuring 44 mm. - Mitral valve: Calcified annulus. Mildly thickened leaflets . Trivial regurgitation. - Left atrium: The atrium was moderately dilated. - Right ventricle: The cavity size was mildly dilated. Wall thickness was normal. Systolic function was mildly reduced. - Tricuspid valve: Moderate regurgitation. - Pulmonic valve: Trivial regurgitation. - Pulmonary arteries: Systolic pressure was within the normal range. Impressions:  - Compared to the prior study in 2012 there is no significant change.  ASSESSMENT AND PLAN:  1. CAD-post bypass. Currently doing well with no anginal symptoms. He ended up going for back surgery, he would be of at least moderate risk. 2. Morbid obesity-continue to encourage weight loss.  Encouraged water aerobics at [facility]. 3. Hyperlipidemia-LDL 72 November of 2014. Crestor 40. LDL goal 70. 4. 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 Echocardiogram stable last showing 4.4 cm, will likely repeat in 1 year. 5. Mild aortic stenosis-no significant change. 6. Hypertension-currently well controlled on multidrug regimen. 7. Spinal stenosis-currently Injections. Knee pain. His orthopedic issue seemed to be his main limitations. 8. 6 month f/u  Signed, Candee Furbish, MD [facility]  10/25/2014 8:29 AM

## 2015-02-22 ENCOUNTER — Ambulatory Visit (INDEPENDENT_AMBULATORY_CARE_PROVIDER_SITE_OTHER): Payer: Managed Care, Other (non HMO) | Admitting: Internal Medicine

## 2015-02-22 ENCOUNTER — Encounter: Payer: Self-pay | Admitting: Internal Medicine

## 2015-02-22 VITALS — BP 120/78 | HR 79 | Ht 72.0 in | Wt 299.0 lb

## 2015-02-22 DIAGNOSIS — I350 Nonrheumatic aortic (valve) stenosis: Secondary | ICD-10-CM

## 2015-02-22 DIAGNOSIS — R0602 Shortness of breath: Secondary | ICD-10-CM | POA: Diagnosis not present

## 2015-02-22 DIAGNOSIS — G4733 Obstructive sleep apnea (adult) (pediatric): Secondary | ICD-10-CM | POA: Diagnosis not present

## 2015-02-22 MED ORDER — UMECLIDINIUM-VILANTEROL 62.5-25 MCG/INH IN AEPB: 1.0000 | INHALATION_SPRAY | Freq: Every day | RESPIRATORY_TRACT | Status: DC

## 2015-02-22 NOTE — Progress Notes (Signed)
06/13/11- 21 yoM never smoker here with his wife on kind referral from Dr. Stephanie Acre for complaint of dyspnea.  He had pneumonia with a flu like illness early in the year and then again in August 2012, both treated as outpatient. He had been playing tennis regularly, as recently as April or May. He had a partial colectomy in March of 2012. He had tried to ride an Comptroller at the [employer] He had to stop that when he developed pressure soreness from the seat. He has had no regular exercise since June of 2012. He regained weight he had lost. Activity is now limited by knee pain.   He had been retaining fluid. He was evaluated for cardiology at [facility] and told his cardiac situation was stable. An echocardiogram was done and Lasix was doubled. 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 He denies exertional heart pain or palpitation now and ankle edema is controlled. There is no history of DVT or pulmonary embolism. NPSG at [facility]- dx'd OSA, treated with CPAP used all night, every night and helpful. He is asking that I address this. We will need to get his original diagnostic sleep study. Has had flu and pneumonia vaccines.  07/24/11- 34 yoM never smoker followed for Dyspnea, OSA, obesity, CAD/Hx MI . Wife here No significant change since last visit. He comes for review pulmonary function studies. PFT-07/24/2011-normal spirometry flows within significant response to bronchodilator. Diffusion mildly reduced. FEV1/FVC 0.77. 6 Minute Walk Test- 96%, 88%, 98% 432 meters. Note exertional desaturation.   09/04/11-  4 yoM never smoker followed for Dyspnea, OSA, obesity, CAD/Hx MI . Wife here [employer] CPAP was initially too low. He is now using his old CPAP machine where we wait for the download from [place]. Original NPSG 04/27/05- AHI 16.09/hr  10/16/11- 29 yoM never smoker followed for Dyspnea, OSA, obesity, CAD/Hx MI . Wife  here Doing well with his CPAP/ [facility]. Download indicated best pressure 11 giving good control. Compliance is excellent. Noting some painless chest tightness without palpitation. May wheeze a little. He shows me samples of Dulera 200, Symbicort 80 and Ventolin. He was unclear how to be using these and was using all on a when necessary basis.  05/24/12- 26 yoM never smoker followed for Dyspnea, OSA, obesity, CAD/Hx MI . Wife here Wears CPAP every night for approximately 4-5 hours, pressure  may be too high-wife states that the mask makes too much noise  He still falls asleep easily if quiet but does not consider this obtrusive and has no trouble driving. Now on a Z-Pak for a sinus infection.  11/22/12- 66 yoM never smoker followed for Dyspnea, OSA, obesity, CAD/Hx MI . Wife here FOLLOWS FOR: wears CPAP 10/ [facility] every night for about 6 hours and pressure doing well for patient. He and his wife are satisfied with CPAP. Wife still thinks his breathing is "noisy". Recent nasal congestion from a cold with postnasal drip. We discussed ability to reduce the pressure a little bit more if needed.He has not lost weight.  02/21/14- 67 yoM never smoker followed for Dyspnea, OSA, obesity, CAD/Hx MI . Wife here FOLLOWS FOR: Wears CPAP 10/ [facility] every night for about 5-6 hours; unsure if pressure is okay-making funny noises;  NPSG 04/27/05 Moderate OSA, AHI 16/ hr, weight 290 lbs. Some wheeze, cough, dyspnea on exertion. Occasionally uses rescue inhaler. No recent change or acute event.  02/22/15- 68 yoM never smoker followed for  Dyspnea, OSA, obesity, CAD/Hx MI . Wife here FOLLOWS FOR: Wears CPAP 10 every night for about 5 hours; DME is [facility] . Pressure works well for patient. They deny snore through, daytime sleepiness. "Trying to lose weight" No wheeze, some increased dry cough, DOE.  ROS-see HPI Constitutional:   No-   weight loss, night sweats, fevers, chills, fatigue, lassitude. HEENT:   No-   headaches, difficulty swallowing, tooth/dental problems, sore throat,       No-  sneezing, itching, ear ache, nasal congestion, +post nasal drip,  CV:  No-   chest pain, orthopnea, PND, swelling in lower extremities, anasarca, dizziness, palpitations Resp: + shortness of breath with exertion, + at rest.              No-   productive cough,  + non-productive cough,  No- coughing up of blood.              No-   change in color of mucus.  No- wheezing.   Skin: No-   rash or lesions. GI:  No-   heartburn, indigestion, abdominal pain, nausea, vomiting,  GU: MS:  +  joint pain or swelling.   Neuro-     nothing unusual Psych:  No- change in mood or affect. No depression or anxiety.  No memory loss.  OBJ- General- Alert, Oriented, Affect-appropriate, Distress- none acute, + morbid obesity Skin- rash-none, lesions- none, excoriation- none. Tanned Lymphadenopathy- none Head- atraumatic            Eyes- Gross vision intact, PERRLA, conjunctivae clear secretions            Ears- Hearing, canals-normal            Nose- Clear, no-Septal dev, mucus, polyps, erosion, perforation             Throat- Mallampati IV , mucosa clear , drainage- none, tonsils- atrophic Neck- flexible , trachea midline, no stridor , thyroid nl, carotid no bruit Chest - symmetrical excursion , unlabored           Heart/CV- RRR , + 2/6 Systolic murmur , no gallop  , no rub, nl s1 s2                           - JVD- none , edema- 1+, stasis changes- none, varices- none           Lung- clear to P&A, wheeze- none, cough- none , dullness-none, rub- none           Chest wall-  Abd-  Br/ Gen/ Rectal- Not done, not indicated Extrem- cyanosis- none, clubbing, none, atrophy- none, strength- nl Neuro- grossly intact to observation

## 2015-02-22 NOTE — Patient Instructions (Addendum)
Continue CPAP 10/ [facility]  Sample Anoro Ellipta inhaler   1 puff once daily     Try this instead of Symbicort for comparison to see if it helps shortness of breath or cough.  Please call if needed

## 2015-02-28 ENCOUNTER — Encounter: Payer: Managed Care, Other (non HMO) | Attending: Family Medicine | Admitting: Dietician

## 2015-02-28 ENCOUNTER — Encounter: Payer: Self-pay | Admitting: Dietician

## 2015-02-28 DIAGNOSIS — Z6841 Body Mass Index (BMI) 40.0 and over, adult: Secondary | ICD-10-CM | POA: Insufficient documentation

## 2015-02-28 DIAGNOSIS — Z713 Dietary counseling and surveillance: Secondary | ICD-10-CM | POA: Diagnosis not present

## 2015-02-28 NOTE — Progress Notes (Signed)
  Medical Nutrition Therapy:  Appt start time: 0845 end time:  0945.   Assessment:  Primary concerns today: Paul Bryant is here today (with his wife) since his doctor referred him for obesity and pre diabetes. Hgb A1c was 5.9% and was testing blood sugar until ran out of strips/lancets. Readings were under 100 fasting and never above 140 mg/dl after eating. Started taking Victoza in the past few weeks and lost a few pounds. Has not made changes to his diet. Cannot exercise since his knees are bad. Is in a lot of pain. Used to play tennis frequently. Has some trouble with balance. Would like to lose weight and is ok changing his diet.  Has lost 60 lbs on [employer] [facility] loss a few years ago. Wife measured a lot of the portions out.   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 Lives with his wife. His wife does the meal preparation at home. "Forces" himself to eat breakfast though might skip 3-4 x week. Does not like to eat in the morning.  Eats out about 7 x week.   Feels like portion sizes are likely big. Tends to eat meals quickly. Eats meals in the kitchen with the TV on.  Preferred Learning Style:   No preference indicated   Learning Readiness:   Contemplating  MEDICATIONS: Victoza   DIETARY INTAKE:  Usual eating pattern includes 2-3 meals and 2-3 snacks per day.  Avoided foods include oatmeal, doesn't like food to touch, leftovers        24-hr recall:  B ( AM): cream of wheat with fruit or fruit or nothing or eggs with bacon with toast on weekend Snk ( AM): none L ( PM): 2-3 x week hamburger or [employer] roast beef, baked lasagna with diet coke or sweet tea or deli's - club sandwich or [place] reuban veggie pizza on weekend Snk ( PM): peanuts or cheese and crackers D ( PM): salad, meat, broccoli and steamed vegetables  Snk ( PM): frozen yogurt Beverages: diet coke, coffee stevia, half sweet tea  Usual physical activity: none  Estimated energy needs: 2000 calories 225 g  carbohydrates 150 g protein 56 g fat  Progress Towards Goal(s):  In progress.   Nutritional Diagnosis:  [place]-3.3 Overweight/obesity As related to hx of large portions sizes and frequent restaurant meals.  As evidenced by BMI of 40.9 and Hgb A1c of 5.9%.    Intervention:  Nutrition counseling provided. Plan: Aim to have a meal or snack every 3-5 hours you are awake. Try a protein shake (Premier or [facility]) in the morning with fruit or boiled egg with fruit.  Have protein and carbohydrate together for snacks (yogurt, cheese, peanut butter, meat) with fruit or crackers. Have protein the size of your palm of your hand. Stop buying frozen yogurt.   Teaching Method Utilized:  Visual Auditory Hands on  Handouts given during visit include:  Living Well With Diabetes  Meal Card  MyPlate Handout  15 g CHO Snacks  Supplements given during visit include:  [employer], lot # [id number], exp 08/23/2015  Premier [employer], lot # [id number], exp 08/23/2015  Barriers to learning/adherence to lifestyle change: "picky" about foods  Demonstrated degree of understanding via:  Teach Back   Monitoring/Evaluation:  Dietary intake, exercise, and body weight in 2-4 week(s).

## 2015-02-28 NOTE — Patient Instructions (Addendum)
Aim to have a meal or snack every 3-5 hours you are awake. Try a protein shake (Premier or [facility]) in the morning with fruit or boiled egg with fruit.  Have protein and carbohydrate together for snacks (yogurt, cheese, peanut butter, meat) with fruit or crackers. Have protein the size of your palm of your hand. Stop buying frozen yogurt.

## 2015-04-05 ENCOUNTER — Encounter: Payer: Self-pay | Admitting: Dietician

## 2015-04-05 ENCOUNTER — Encounter: Payer: Managed Care, Other (non HMO) | Attending: Family Medicine | Admitting: Dietician

## 2015-04-05 DIAGNOSIS — Z6841 Body Mass Index (BMI) 40.0 and over, adult: Secondary | ICD-10-CM | POA: Insufficient documentation

## 2015-04-05 DIAGNOSIS — Z713 Dietary counseling and surveillance: Secondary | ICD-10-CM | POA: Insufficient documentation

## 2015-04-05 NOTE — Assessment & Plan Note (Signed)
He says he is trying. Activity limited more by joints and back pains.

## 2015-04-05 NOTE — Assessment & Plan Note (Signed)
Pt and wife describe good compliance and control at CPAP 10/ [facility].  Plan- download for documentation

## 2015-04-05 NOTE — Progress Notes (Signed)
  Medical Nutrition Therapy:  Appt start time: 0310 end time: 400 .  Assessment:  Primary concerns today: Paul Bryant is here today (with his wife) for a follow up. Gained 3 lbs since last visit. Has been retaining water lately. Feels like when he was on Metformin it curbed his appetite and has not felt the same way since starting Victoza. Got some test strips/lancets. Testing blood sugar 1-2 x day. This morning it was 167 mg/dl and was in the 80's after eating recently. Feels like it is "bouncing" all around.   Feels like he is eating enough at breakfast now and not skipping. Tried adding a boiled egg with fruit/yogurt. Tried Hydrographic surveyor. Some protein shakes "go through him" but Premier sits ok.   Feels like portion sizes are still big. Still eating meal quickly.  Wt Readings from Last 3 Encounters:  04/05/15 304 lb 4.8 oz (138.03 kg)  02/28/15 301 lb 6.4 oz (136.714 kg)  02/22/15 299 lb (135.626 kg)   Ht Readings from Last 3 Encounters:  04/05/15 6' (1.829 m)  02/28/15 6' (1.829 m)  02/22/15 6' (1.829 m)   Body mass index is 41.26 kg/(m^2). @BMIFA @ Normalized weight-for-age data available only for age 44 to 31 years. Normalized stature-for-age data available only for age 44 to 16 years.   Preferred Learning Style:   No preference indicated   Learning Readiness:   Contemplating  MEDICATIONS: Victoza   DIETARY INTAKE:  Usual eating pattern includes 2-3 meals and 2-3 snacks per day.  Avoided foods include oatmeal, doesn't like food to touch, leftovers        24-hr recall:  B (530 AM): boiled egg with yogurt or cream of wheat or eggs/bacon on weekend Snk (830 AM): fruit L (11-1130PM): 2-3 x week hamburger or [employer] roast beef, with diet coke or sweet tea or deli's - club sandwich or [place] reuban veggie pizza on weekend (less than before) Snk ( after 5PM): peanuts or cheese and 6-8 crackers and Icee  D ( PM): salad, 2 pieces of meat, broccoli and steamed vegetables  Snk (  PM): Premier Protein Shake  Beverages: diet coke, water, coffee stevia, half sweet tea at restaurants  Usual physical activity: none  Estimated energy needs: 2000 calories 225 g carbohydrates 150 g protein 56 g fat  Progress Towards Goal(s):  In progress.   Nutritional Diagnosis:  [place]-3.3 Overweight/obesity As related to hx of large portions sizes and frequent restaurant meals.  As evidenced by BMI of 40.9 and Hgb A1c of 5.9%.    Intervention:  Nutrition counseling provided. Plan: Try packing an afternoon snack to bring to work (cheese/crackers).  Have protein and carbohydrate together for snacks (yogurt, cheese, peanut butter, meat) with fruit or crackers or celery. Have protein the size of your palm of your hand (4-5 oz). Try to chew 20-30 x per bite. Put your fork down between bites.  Look into using an exercise bike.  Test blood sugar 1 x day and write down readings. Try testing at different times of the day.  Look in downloading [employer] for carbs/calories.  Teaching Method Utilized:  Visual Auditory Hands on  Handouts given during visit include:  Blood Glucose Monitoring  Barriers to learning/adherence to lifestyle change: "picky" about foods  Demonstrated degree of understanding via:  Teach Back   Monitoring/Evaluation:  Dietary intake, exercise, and body weight prn

## 2015-04-05 NOTE — Assessment & Plan Note (Signed)
Multifactorial- obvious weight and decondititioning. Doubt heart murmur is hemodynamically signif. Plan- see if Anoro sample any better than Symbicort. Previous PFT w/o response to BD, so don't expect much.

## 2015-04-05 NOTE — Patient Instructions (Addendum)
Try packing an afternoon snack to bring to work (cheese/crackers).  Have protein and carbohydrate together for snacks (yogurt, cheese, peanut butter, meat) with fruit or crackers or celery. Have protein the size of your palm of your hand (4-5 oz). Try to chew 20-30 x per bite. Put your fork down between bites.  Look into using an exercise bike.  Test blood sugar 1 x day and write down readings. Try testing at different times of the day.  Look in downloading [employer] for carbs/calories.

## 2015-04-05 NOTE — Assessment & Plan Note (Signed)
Murmur sounds about the same

## 2015-04-24 ENCOUNTER — Encounter: Payer: Self-pay | Admitting: Cardiology

## 2015-04-24 ENCOUNTER — Ambulatory Visit (INDEPENDENT_AMBULATORY_CARE_PROVIDER_SITE_OTHER): Payer: Managed Care, Other (non HMO) | Admitting: Cardiology

## 2015-04-24 VITALS — BP 128/80 | HR 81 | Ht 72.0 in | Wt 302.8 lb

## 2015-04-24 DIAGNOSIS — I2510 Atherosclerotic heart disease of native coronary artery without angina pectoris: Secondary | ICD-10-CM

## 2015-04-24 DIAGNOSIS — G4733 Obstructive sleep apnea (adult) (pediatric): Secondary | ICD-10-CM | POA: Diagnosis not present

## 2015-04-24 DIAGNOSIS — I350 Nonrheumatic aortic (valve) stenosis: Secondary | ICD-10-CM | POA: Diagnosis not present

## 2015-04-24 DIAGNOSIS — E785 Hyperlipidemia, unspecified: Secondary | ICD-10-CM

## 2015-04-24 DIAGNOSIS — I77810 Thoracic aortic ectasia: Secondary | ICD-10-CM

## 2015-04-24 DIAGNOSIS — I2583 Coronary atherosclerosis due to lipid rich plaque: Secondary | ICD-10-CM

## 2015-04-24 MED ORDER — ROSUVASTATIN CALCIUM 40 MG PO TABS: 40.0000 mg | ORAL_TABLET | Freq: Every day | ORAL | Status: DC

## 2015-04-24 MED ORDER — NITROGLYCERIN 0.4 MG SL SUBL: 0.4000 mg | SUBLINGUAL_TABLET | SUBLINGUAL | Status: DC | PRN

## 2015-04-24 MED ORDER — DOXAZOSIN MESYLATE 1 MG PO TABS: ORAL_TABLET | ORAL | Status: DC

## 2015-04-24 MED ORDER — IRBESARTAN 75 MG PO TABS: 75.0000 mg | ORAL_TABLET | Freq: Every day | ORAL | Status: DC

## 2015-04-24 MED ORDER — BISOPROLOL FUMARATE 10 MG PO TABS: 10.0000 mg | ORAL_TABLET | Freq: Two times a day (BID) | ORAL | Status: DC

## 2015-04-24 NOTE — Patient Instructions (Signed)
Medication Instructions:  The current medical regimen is effective;  continue present plan and medications.  Testing/Procedures: Your physician has requested that you have an echocardiogram. Echocardiography is a painless test that uses sound waves to create images of your heart. It provides your doctor with information about the size and shape of your heart and how well your heart's chambers and valves are working. This procedure takes approximately one hour. There are no restrictions for this procedure.  Follow-Up: Follow up in 6 months with Dr. Skains.  You will receive a letter in the mail 2 months before you are due.  Please call us when you receive this letter to schedule your follow up appointment.  Thank you for choosing [facility]!!     

## 2015-04-24 NOTE — Progress Notes (Signed)
[facility]. [address] [place], [place]  [address] Phone: [phone] Fax:  [phone]  Date:  04/24/2015   ID:  Paul Bryant, DOB 1946/06/16, MRN [id number]  PCP:  Lilian Coma, MD   History of Present Illness: Paul Bryant is a 69 y.o. male with hx of coronary artery disease status post myocardial infarction at age 69 originally treated by Dr. Melvern Banker first treated with POBA then eventually CABG at 72 with hypertension, hyperlipidemia, obesity. He underwent a nuclear stress test on 06/17/10 which showed an ejection fraction of 46%, inferior wall infarction of moderate sized, very small degree of peri-infarct ischemia in this distribution.   LDL 72. Crestor 40. Wife asked previously about need for repeat catheterization. Since he is having no change in symptoms, we will continue to monitor clinically with aggressive secondary risk factor prevention. Stress test has been done in the past.  Weight loss has been an issue. This is contributing to his orthopedic issues as well. Orthopedic issues seem to be his main complaint.  Doppler of left lower extremity after injury to ensure that he does not have any signs of DVT was negative on 04/26/14.   Sees Dr. [name]. Also some nutritionist. Recently changed to new diabetic medication.  Has shortness of breath with activity. Enjoys grandchildren. 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    Wt Readings from Last 3 Encounters:  04/05/15 304 lb 4.8 oz (138.03 kg)  02/28/15 301 lb 6.4 oz (136.714 kg)  02/22/15 299 lb (135.626 kg)     Past Medical History  Diagnosis Date  . Diverticulitis oct. 2011  . Heart attack   . Hypertension   . IBS (irritable bowel syndrome)   . Dizziness   . CAD (coronary artery disease)   . RBBB (right bundle branch block with left anterior fascicular block)   . Gout   . Neuropathy   . Obesity, morbid   . Hernia     umbilical and ventral  . COPD (chronic obstructive pulmonary disease)     mild  .  Urolithiasis   . OSA on CPAP   . Hyperlipidemia   . Sleep apnea     Past Surgical History  Procedure Laterality Date  . Open heart bypass  aug. 1998  . Quadruple bypass    . Lap sigmoid colectomy due to persistent    . Umbilical hernia repair      right proctoscopy  . Ureteral stent placement  09-18-2010    Dr Diona Fanti    Current Outpatient Prescriptions  Medication Sig Dispense Refill  . ALPRAZolam (XANAX) 0.25 MG tablet Take 0.25 mg by mouth 2 (two) times daily as needed for anxiety.     [name] aspirin 81 MG tablet Take 81 mg by mouth daily.      . B-D UF III MINI PEN NEEDLES 31G X 5 MM MISC USE AS DIRECTED WITH VICTOZA  2  . bisoprolol (ZEBETA) 10 MG tablet Take 1 tablet (10 mg total) by mouth 2 (two) times daily. 60 tablet 11  . budesonide-formoterol (SYMBICORT) 80-4.5 MCG/ACT inhaler 2 puffs then rinse mouth, twice daily 1 Inhaler 12  . doxazosin (CARDURA) 1 MG tablet TK 1 T PO ONCE D  2  . furosemide (LASIX) 20 MG tablet Take 2 tablets (40 mg total) by mouth 2 (two) times daily. 120 tablet 11  . gabapentin (NEURONTIN) 100 MG capsule Take 100 mg by mouth daily.   5  . irbesartan (AVAPRO) 75  MG tablet Take 1 tablet (75 mg total) by mouth at bedtime. 30 tablet 11  . nitroGLYCERIN (NITROSTAT) 0.4 MG SL tablet Place 0.4 mg under the tongue every 5 (five) minutes as needed for chest pain (x 3 pills daily).    . rosuvastatin (CRESTOR) 40 MG tablet Take 1 tablet (40 mg total) by mouth daily. 30 tablet 11  . traMADol (ULTRAM) 50 MG tablet Take 50 mg by mouth every 6 (six) hours as needed (knee pain).    [name] VICTOZA 18 MG/3ML SOPN Inject 1.2 Units into the skin daily.  6   No current facility-administered medications for this visit.    Allergies:    Allergies  Allergen Reactions  . Penicillins Rash    hives    Social History:  The patient  reports that he has never smoked. His smokeless tobacco use includes Snuff. He reports that he drinks alcohol. He reports that he does not use  illicit drugs.   ROS:  Please see the history of present illness. Chronic leg swelling, shortness of breath with activity, back pain, muscle pain, knee pain, sweating  Denies any fevers, chills, orthopnea, PND    PHYSICAL EXAM: VS:  There were no vitals taken for this visit. Well nourished, well developed, in no acute distress HEENT: normal Neck: no JVD Cardiac:  normal S1, S2; RRR; 3/6 musical murmurRight upper sternal border Lungs:  clear to auscultation bilaterally, no wheezing, rhonchi or rales Abd: soft, nontender, no hepatomegalyObese Ext: chronic minor edemaright > left.  Skin: warm and dry Neuro: no focal abnormalities noted  EKG: 09/15/13 Sinus rhythm, right bundle branch block, 67     ECHO: 10/21/13 - Left ventricle: The cavity size was normal. There was moderate concentric hypertrophy. Systolic function was normal. The estimated ejection fraction was in the range of 50% to 55%. Wall motion was normal; there were no regional wall motion abnormalities. Doppler parameters are consistent with abnormal left ventricular relaxation (grade 1 diastolic dysfunction). Doppler parameters are consistent with elevated ventricular end-diastolic filling pressure. - Aortic valve: Cusp separation was moderately reduced. There was mild to moderate stenosis. Mild regurgitation. Mean gradient: 18 mm Hg (S). Valve area: 1.72cm^2(VTI). Valve area: 1.52cm^2 (Vmax). - Aorta: Dilated aortic root measuring 44 mm. - Mitral valve: Calcified annulus. Mildly thickened leaflets . Trivial regurgitation. - Left atrium: The atrium was moderately dilated. - Right ventricle: The cavity size was mildly dilated. Wall thickness was normal. Systolic function was mildly reduced. - Tricuspid valve: Moderate regurgitation. - Pulmonic valve: Trivial regurgitation. - Pulmonary arteries: Systolic pressure was within the normal range. Impressions:  - Compared to the prior study in  2012 there is no significant change.  ASSESSMENT AND PLAN:  1. CAD-post bypass. Currently doing well with no anginal symptoms. If he ended up going for back surgery, he would be of at least moderate risk. He's not having any active chest pain. His shortness of breath is likely largely from morbid obesity, deconditioning. He is very limited in his exercise capacity based upon his neuropathy, balance, osteoarthritis of knee. 2. Morbid obesity-continue to encourage weight loss. Encouraged water aerobics at [facility], he is quite hesitant. Wife was present for discussion. He has seen nutritionist as well. He admits to enjoying crackers. Try to eliminate this from his diet. 3. Hyperlipidemia-LDL 72 November of 2014. Crestor 40. LDL goal 70. 4. 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 Echocardiogram stable last showing 4.4 cm, will repeat. 5. Mild to moderate aortic stenosis-no  significant change. 6. Hypertension-currently well controlled on multidrug regimen. 7. Spinal stenosis-currently Injections. Knee pain. His orthopedic issue seemed to be his main limitations. 8. 6 month f/u  Signed, Candee Furbish, MD [facility]  04/24/2015 8:52 AM

## 2015-04-25 ENCOUNTER — Other Ambulatory Visit: Payer: Self-pay | Admitting: Cardiology

## 2015-05-07 ENCOUNTER — Ambulatory Visit (HOSPITAL_COMMUNITY): Payer: Managed Care, Other (non HMO)

## 2015-06-28 ENCOUNTER — Encounter: Payer: Self-pay | Admitting: Cardiology

## 2015-09-07 ENCOUNTER — Other Ambulatory Visit: Payer: Self-pay | Admitting: Cardiology

## 2015-09-22 ENCOUNTER — Other Ambulatory Visit: Payer: Self-pay | Admitting: Internal Medicine

## 2015-10-29 ENCOUNTER — Ambulatory Visit (INDEPENDENT_AMBULATORY_CARE_PROVIDER_SITE_OTHER): Payer: Managed Care, Other (non HMO) | Admitting: Cardiology

## 2015-10-29 ENCOUNTER — Encounter: Payer: Self-pay | Admitting: Cardiology

## 2015-10-29 VITALS — BP 138/78 | HR 72 | Ht 72.0 in | Wt 294.4 lb

## 2015-10-29 DIAGNOSIS — I2510 Atherosclerotic heart disease of native coronary artery without angina pectoris: Secondary | ICD-10-CM

## 2015-10-29 DIAGNOSIS — I77810 Thoracic aortic ectasia: Secondary | ICD-10-CM

## 2015-10-29 DIAGNOSIS — I350 Nonrheumatic aortic (valve) stenosis: Secondary | ICD-10-CM

## 2015-10-29 DIAGNOSIS — I2583 Coronary atherosclerosis due to lipid rich plaque: Secondary | ICD-10-CM

## 2015-10-29 DIAGNOSIS — I440 Atrioventricular block, first degree: Secondary | ICD-10-CM

## 2015-10-29 NOTE — Patient Instructions (Signed)
Medication Instructions:  The current medical regimen is effective;  continue present plan and medications.  Testing/Procedures: Your physician has requested that you have an echocardiogram. Echocardiography is a painless test that uses sound waves to create images of your heart. It provides your doctor with information about the size and shape of your heart and how well your heart's chambers and valves are working. This procedure takes approximately one hour. There are no restrictions for this procedure.  Follow-Up: Follow up in 6 months with Dr. Skains.  You will receive a letter in the mail 2 months before you are due.  Please call us when you receive this letter to schedule your follow up appointment.  If you need a refill on your cardiac medications before your next appointment, please call your pharmacy.  Thank you for choosing [facility]!!       

## 2015-10-29 NOTE — Progress Notes (Signed)
[facility]. [address] [place], [place]  [address] Phone: [phone] Fax:  [phone]  Date:  10/29/2015   ID:  Paul Bryant, DOB [birth year], MRN [id number]  PCP:  Lilian Coma, MD   History of Present Illness: Paul Bryant is a 70 y.o. male with hx of coronary artery disease status post myocardial infarction at age 63 originally treated by Dr. Melvern Banker first treated with POBA then eventually CABG at 62 with hypertension, hyperlipidemia, obesity. He underwent a nuclear stress test on 06/17/10 which showed an ejection fraction of 46%, inferior wall infarction of moderate sized, very small degree of peri-infarct ischemia in this distribution.   LDL 72. Crestor 40. Wife asked previously about need for repeat catheterization. Since he is having no change in symptoms, we will continue to monitor clinically with aggressive secondary risk factor prevention. Stress test has been done in the past, 2011.  Weight loss has been an issue. He was actually down a few pounds. This is contributing to his orthopedic issues as well. Orthopedic issues seem to be his main complaint.  Doppler of left lower extremity after injury to ensure that he does not have any signs of DVT was negative on 04/26/14.   Sees Dr. [name]. Also a nutritionist in the past. Recently changed to new diabetic medication.  Has shortness of breath with activity. Enjoys grandchildren. 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    Wt Readings from Last 3 Encounters:  10/29/15 294 lb 6.4 oz (133.539 kg)  04/24/15 302 lb 12.8 oz (137.349 kg)  04/05/15 304 lb 4.8 oz (138.03 kg)     Past Medical History  Diagnosis Date  . Diverticulitis oct. 2011  . Heart attack ([place])   . Hypertension   . IBS (irritable bowel syndrome)   . Dizziness   . CAD (coronary artery disease)   . RBBB (right bundle branch block with left anterior fascicular block)   . Gout   . Neuropathy ([place])   . Obesity, morbid ([place])   . Hernia     umbilical and  ventral  . COPD (chronic obstructive pulmonary disease) (HCC)     mild  . Urolithiasis   . OSA on CPAP   . Hyperlipidemia   . Sleep apnea     Past Surgical History  Procedure Laterality Date  . Open heart bypass  aug. 1998  . Quadruple bypass    . Lap sigmoid colectomy due to persistent    . Umbilical hernia repair      right proctoscopy  . Ureteral stent placement  09-18-2010    Dr Diona Fanti    Current Outpatient Prescriptions  Medication Sig Dispense Refill  . aspirin 81 MG tablet Take 81 mg by mouth daily.      . B-D UF III MINI PEN NEEDLES 31G X 5 MM MISC USE AS DIRECTED WITH VICTOZA  2  . bisoprolol (ZEBETA) 10 MG tablet Take 1 tablet (10 mg total) by mouth 2 (two) times daily. 60 tablet 11  . doxazosin (CARDURA) 1 MG tablet TK 1 T PO ONCE D 30 tablet 11  . furosemide (LASIX) 20 MG tablet TAKE 2 TABLETS BY MOUTH TWICE DAILY 120 tablet 6  . irbesartan (AVAPRO) 75 MG tablet Take 1 tablet (75 mg total) by mouth at bedtime. 30 tablet 11  . metFORMIN (GLUCOPHAGE) 500 MG tablet Take one tablet by mouth 2 times daily  12  . nitroGLYCERIN (NITROSTAT) 0.4 MG SL tablet Place 1 tablet (  0.4 mg total) under the tongue every 5 (five) minutes as needed for chest pain (x 3 pills daily). 25 tablet prn  . rosuvastatin (CRESTOR) 40 MG tablet Take 1 tablet (40 mg total) by mouth daily. 30 tablet 11  . SYMBICORT 80-4.5 MCG/ACT inhaler USE 2 PUFFS TWICE DAILY THEN RINSE MOUTH 10.2 g 5  . traMADol (ULTRAM) 50 MG tablet Take 50 mg by mouth every 6 (six) hours as needed (knee pain).     No current facility-administered medications for this visit.    Allergies:    Allergies  Allergen Reactions  . Penicillins Rash    hives    Social History:  The patient  reports that he has never smoked. His smokeless tobacco use includes Snuff. He reports that he drinks alcohol. He reports that he does not use illicit drugs.   ROS:  Please see the history of present illness. Chronic leg swelling, shortness  of breath with activity, back pain, muscle pain, knee pain, sweating  Denies any fevers, chills, orthopnea, PND    PHYSICAL EXAM: VS:  BP 138/78 mmHg  Pulse 72  Ht 6' (1.829 m)  Wt 294 lb 6.4 oz (133.539 kg)  BMI 39.92 kg/m2 Well nourished, well developed, in no acute distress HEENT: normal Neck: no JVD Cardiac:  normal S1, S2; RRR; 3/6 musical murmurRight upper sternal border Lungs:  clear to auscultation bilaterally, no wheezing, rhonchi or rales Abd: soft, nontender, no hepatomegalyObese Ext: chronic minor edemaright > left.  Skin: warm and dry Neuro: no focal abnormalities noted  EKG: EKG was ordered today. 10/29/15 showed sinus rhythm with first-degree AV block, PR interval 212 ms with right bundle branch block, old inferior infarct pattern. Personally viewed-prior 09/15/13 Sinus rhythm, right bundle branch block, 67     ECHO: 10/21/13 - Left ventricle: The cavity size was normal. There was moderate concentric hypertrophy. Systolic function was normal. The estimated ejection fraction was in the range of 50% to 55%. Wall motion was normal; there were no regional wall motion abnormalities. Doppler parameters are consistent with abnormal left ventricular relaxation (grade 1 diastolic dysfunction). Doppler parameters are consistent with elevated ventricular end-diastolic filling pressure. - Aortic valve: Cusp separation was moderately reduced. There was mild to moderate stenosis. Mild regurgitation. Mean gradient: 18 mm Hg (S). Valve area: 1.72cm^2(VTI). Valve area: 1.52cm^2 (Vmax). - Aorta: Dilated aortic root measuring 44 mm. - Mitral valve: Calcified annulus. Mildly thickened leaflets . Trivial regurgitation. - Left atrium: The atrium was moderately dilated. - Right ventricle: The cavity size was mildly dilated. Wall thickness was normal. Systolic function was mildly reduced. - Tricuspid valve: Moderate regurgitation. - Pulmonic valve: Trivial  regurgitation. - Pulmonary arteries: Systolic pressure was within the normal range. Impressions:  - Compared to the prior study in 2012 there is no significant change.  ASSESSMENT AND PLAN:  1. CAD-post bypass. Currently doing well with no anginal symptoms. If he ended up going for back or knee surgery, he would be of at least moderate risk. He's not having any active chest pain. His shortness of breath is likely largely from morbid obesity, deconditioning. He is very limited in his exercise capacity based upon his neuropathy, balance, osteoarthritis of knee. If he ever did decide to go forward with knee surgery, it would likely be prudent to check nuclear stress test for further risk stratification. 2. First-degree AV block-mild. Right bundle branch block noted. We need to be careful especially with his bisoprolol. No syncope. 3. Morbid obesity-continue to encourage weight loss.  His quality of life is quite limited. Knee pain. Back pain. Wife was present for discussion. He has seen nutritionist as well. 4. Hyperlipidemia-LDL 72 November of 2014. Crestor 40.  5. 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 Echocardiogram stable last showing 4.4 cm, will repeat. 6. Mild to moderate aortic stenosis-no significant change. Checking echocardiogram. 7. Hypertension-currently well controlled on multidrug regimen. 8. Spinal stenosis-currently receiving Injections. Knee pain. His orthopedic issue seemed to be his main limitations. His wife expressed this. 9. 6 month f/u  Signed, Candee Furbish, MD [facility]  10/29/2015 4:19 PM

## 2015-11-14 ENCOUNTER — Other Ambulatory Visit: Payer: Self-pay

## 2015-11-14 ENCOUNTER — Ambulatory Visit (HOSPITAL_COMMUNITY): Payer: Managed Care, Other (non HMO) | Attending: Cardiovascular Disease

## 2015-11-14 DIAGNOSIS — J449 Chronic obstructive pulmonary disease, unspecified: Secondary | ICD-10-CM | POA: Diagnosis not present

## 2015-11-14 DIAGNOSIS — I350 Nonrheumatic aortic (valve) stenosis: Secondary | ICD-10-CM

## 2015-11-14 DIAGNOSIS — E785 Hyperlipidemia, unspecified: Secondary | ICD-10-CM | POA: Diagnosis not present

## 2015-11-14 DIAGNOSIS — I119 Hypertensive heart disease without heart failure: Secondary | ICD-10-CM | POA: Diagnosis not present

## 2015-11-14 DIAGNOSIS — I352 Nonrheumatic aortic (valve) stenosis with insufficiency: Secondary | ICD-10-CM | POA: Insufficient documentation

## 2015-11-14 DIAGNOSIS — I2510 Atherosclerotic heart disease of native coronary artery without angina pectoris: Secondary | ICD-10-CM | POA: Insufficient documentation

## 2015-11-16 ENCOUNTER — Other Ambulatory Visit: Payer: Self-pay | Admitting: *Deleted

## 2015-11-16 DIAGNOSIS — I77810 Thoracic aortic ectasia: Secondary | ICD-10-CM

## 2015-11-16 DIAGNOSIS — Z01812 Encounter for preprocedural laboratory examination: Secondary | ICD-10-CM

## 2015-11-20 ENCOUNTER — Other Ambulatory Visit: Payer: Managed Care, Other (non HMO)

## 2015-11-22 ENCOUNTER — Inpatient Hospital Stay: Admission: RE | Admit: 2015-11-22 | Payer: Managed Care, Other (non HMO) | Source: Ambulatory Visit

## 2015-11-26 ENCOUNTER — Other Ambulatory Visit (INDEPENDENT_AMBULATORY_CARE_PROVIDER_SITE_OTHER): Payer: Managed Care, Other (non HMO)

## 2015-11-26 DIAGNOSIS — I77810 Thoracic aortic ectasia: Secondary | ICD-10-CM | POA: Diagnosis not present

## 2015-11-26 DIAGNOSIS — Z01812 Encounter for preprocedural laboratory examination: Secondary | ICD-10-CM

## 2015-11-26 LAB — BASIC METABOLIC PANEL
BUN: 15 mg/dL (ref 7–25)
CALCIUM: 9.2 mg/dL (ref 8.6–10.3)
CO2: 28 mmol/L (ref 20–31)
CREATININE: 0.72 mg/dL (ref 0.70–1.25)
Chloride: 103 mmol/L (ref 98–110)
GLUCOSE: 162 mg/dL — AB (ref 65–99)
Potassium: 4.6 mmol/L (ref 3.5–5.3)
Sodium: 138 mmol/L (ref 135–146)

## 2015-11-28 ENCOUNTER — Ambulatory Visit (INDEPENDENT_AMBULATORY_CARE_PROVIDER_SITE_OTHER)
Admission: RE | Admit: 2015-11-28 | Discharge: 2015-11-28 | Disposition: A | Discharge status: Home / Self Care | Payer: Managed Care, Other (non HMO) | Source: Ambulatory Visit | Attending: Cardiology | Admitting: Cardiology

## 2015-11-28 DIAGNOSIS — I77810 Thoracic aortic ectasia: Secondary | ICD-10-CM | POA: Diagnosis not present

## 2015-11-28 MED ORDER — IOPAMIDOL (ISOVUE-370) INJECTION 76%
100.0000 mL | Freq: Once | INTRAVENOUS | Status: AC | PRN
  Administered 2015-11-28: 100 mL via INTRAVENOUS

## 2015-12-06 ENCOUNTER — Encounter: Payer: Self-pay | Admitting: *Deleted

## 2016-02-18 ENCOUNTER — Ambulatory Visit: Payer: Managed Care, Other (non HMO) | Admitting: Internal Medicine

## 2016-02-22 ENCOUNTER — Ambulatory Visit: Payer: Managed Care, Other (non HMO) | Admitting: Internal Medicine

## 2016-03-04 ENCOUNTER — Ambulatory Visit
Admission: RE | Admit: 2016-03-04 | Discharge: 2016-03-04 | Disposition: A | Discharge status: Home / Self Care | Payer: Managed Care, Other (non HMO) | Source: Ambulatory Visit | Attending: Family Medicine | Admitting: Family Medicine

## 2016-03-04 ENCOUNTER — Other Ambulatory Visit: Payer: Self-pay | Admitting: Family Medicine

## 2016-03-04 DIAGNOSIS — J441 Chronic obstructive pulmonary disease with (acute) exacerbation: Secondary | ICD-10-CM

## 2016-04-27 ENCOUNTER — Other Ambulatory Visit: Payer: Self-pay | Admitting: Cardiology

## 2016-05-05 ENCOUNTER — Other Ambulatory Visit: Payer: Self-pay | Admitting: Cardiology

## 2016-06-16 ENCOUNTER — Encounter: Payer: Self-pay | Admitting: Cardiology

## 2016-06-16 ENCOUNTER — Encounter (INDEPENDENT_AMBULATORY_CARE_PROVIDER_SITE_OTHER): Payer: Self-pay

## 2016-06-16 ENCOUNTER — Ambulatory Visit (INDEPENDENT_AMBULATORY_CARE_PROVIDER_SITE_OTHER): Payer: Managed Care, Other (non HMO) | Admitting: Cardiology

## 2016-06-16 VITALS — BP 140/72 | HR 68 | Ht 72.0 in | Wt 291.0 lb

## 2016-06-16 DIAGNOSIS — I350 Nonrheumatic aortic (valve) stenosis: Secondary | ICD-10-CM | POA: Diagnosis not present

## 2016-06-16 DIAGNOSIS — G4733 Obstructive sleep apnea (adult) (pediatric): Secondary | ICD-10-CM | POA: Diagnosis not present

## 2016-06-16 DIAGNOSIS — I77810 Thoracic aortic ectasia: Secondary | ICD-10-CM | POA: Diagnosis not present

## 2016-06-16 NOTE — Patient Instructions (Signed)

## 2016-06-16 NOTE — Progress Notes (Signed)
[facility]. [address], [address] [place], [place]  [address] Phone: [phone] Fax:  [phone]  Date:  06/16/2016   ID:  Paul Bryant, DOB Mar 21, 1946, MRN [id number]  PCP:  Lilian Coma, MD   History of Present Illness: Paul Bryant is a 70 y.o. male with hx of coronary artery disease status post myocardial infarction at age 44 originally treated by Dr. Melvern Banker first treated with POBA then eventually CABG at 62 with hypertension, hyperlipidemia, obesity. He underwent a nuclear stress test on 06/17/10 which showed an ejection fraction of 46%, inferior wall infarction of moderate sized, very small degree of peri-infarct ischemia in this distribution.   LDL 72. Crestor 40. Wife asked previously about need for repeat catheterization. Since he is having no change in symptoms, we will continue to monitor clinically with aggressive secondary risk factor prevention. Stress test has been done in the past, 2011.  Weight loss has been an issue. He was actually down a few pounds. This is contributing to his orthopedic issues as well. Orthopedic issues seem to be his main complaint. Back and knee.  Doppler of left lower extremity after injury to ensure that he does not have any signs of DVT was negative on 04/26/14.   Sees Dr. [name]. Also a nutritionist in the past.   Has shortness of breath with activity at baseline. Enjoys grandchildren. 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    Wt Readings from Last 3 Encounters:  06/16/16 291 lb (132 kg)  10/29/15 294 lb 6.4 oz (133.5 kg)  04/24/15 (!) 302 lb 12.8 oz (137.3 kg)     Past Medical History:  Diagnosis Date  . CAD (coronary artery disease)   . COPD (chronic obstructive pulmonary disease) (HCC)    mild  . Diverticulitis oct. 2011  . Dizziness   . Gout   . Heart attack   . Hernia    umbilical and ventral  . Hyperlipidemia   . Hypertension   . IBS (irritable bowel syndrome)   . Neuropathy ([place])   . Obesity, morbid ([place])   . OSA on  CPAP   . RBBB (right bundle branch block with left anterior fascicular block)   . Sleep apnea   . Urolithiasis     Past Surgical History:  Procedure Laterality Date  . lap sigmoid colectomy due to persistent    . open heart bypass  aug. 1998  . quadruple bypass    . UMBILICAL HERNIA REPAIR     right proctoscopy  . URETERAL STENT PLACEMENT  09-18-2010   Dr Diona Fanti    Current Outpatient Prescriptions  Medication Sig Dispense Refill  . aspirin 81 MG tablet Take 81 mg by mouth daily.      . bisoprolol (ZEBETA) 10 MG tablet TAKE 1 TABLET(10 MG) BY MOUTH TWICE DAILY 60 tablet 6  . doxazosin (CARDURA) 1 MG tablet TK 1 T PO ONCE D 30 tablet 11  . furosemide (LASIX) 20 MG tablet TAKE 2 TABLETS BY MOUTH TWICE DAILY 120 tablet 6  . irbesartan (AVAPRO) 75 MG tablet TAKE 1 TABLET(75 MG) BY MOUTH AT BEDTIME 30 tablet 6  . metFORMIN (GLUCOPHAGE) 500 MG tablet Take one tablet by mouth 2 times daily  12  . nitroGLYCERIN (NITROSTAT) 0.4 MG SL tablet Place 1 tablet (0.4 mg total) under the tongue every 5 (five) minutes as needed for chest pain (x 3 pills daily). 25 tablet prn  . rosuvastatin (CRESTOR) 40 MG tablet TAKE 1 TABLET(40  MG) BY MOUTH DAILY 30 tablet 6  . SYMBICORT 80-4.5 MCG/ACT inhaler USE 2 PUFFS TWICE DAILY THEN RINSE MOUTH 10.2 g 5  . traMADol (ULTRAM) 50 MG tablet Take 50 mg by mouth every 6 (six) hours as needed (knee pain).     No current facility-administered medications for this visit.     Allergies:    Allergies  Allergen Reactions  . Penicillins Rash    hives    Social History:  The patient  reports that he has never smoked. His smokeless tobacco use includes Snuff. He reports that he drinks alcohol. He reports that he does not use drugs.   ROS:  Please see the history of present illness. Chronic leg swelling, shortness of breath with activity, back pain, muscle pain, knee pain, sweating  Denies any fevers, chills, orthopnea, PND    PHYSICAL EXAM: VS:  BP 140/72    Pulse 68   Ht 6' (1.829 m)   Wt 291 lb (132 kg)   BMI 39.47 kg/m  Well nourished, well developed, in no acute distress  HEENT: normal  Neck: no JVD  Cardiac:  normal S1, S2; RRR; 3/6 musical murmur Right upper sternal border Lungs:  clear to auscultation bilaterally, no wheezing, rhonchi or rales  Abd: soft, nontender, no hepatomegaly Obese Ext: chronic minor edema right > left.  Skin: warm and dry  Neuro: no focal abnormalities noted  EKG: EKG was ordered today. 10/29/15 showed sinus rhythm with first-degree AV block, PR interval 212 ms with right bundle branch block, old inferior infarct pattern. Personally viewed-prior 09/15/13 Sinus rhythm, right bundle branch block, 67     ECHO: 10/21/13 - Left ventricle: The cavity size was normal. There was moderate concentric hypertrophy. Systolic function was normal. The estimated ejection fraction was in the range of 50% to 55%. Wall motion was normal; there were no regional wall motion abnormalities. Doppler parameters are consistent with abnormal left ventricular relaxation (grade 1 diastolic dysfunction). Doppler parameters are consistent with elevated ventricular end-diastolic filling pressure. - Aortic valve: Cusp separation was moderately reduced. There was mild to moderate stenosis. Mild regurgitation. Mean gradient: 18 mm Hg (S). Valve area: 1.72cm^2(VTI). Valve area: 1.52cm^2 (Vmax). - Aorta: Dilated aortic root measuring 44 mm. - Mitral valve: Calcified annulus. Mildly thickened leaflets . Trivial regurgitation. - Left atrium: The atrium was moderately dilated. - Right ventricle: The cavity size was mildly dilated. Wall thickness was normal. Systolic function was mildly reduced. - Tricuspid valve: Moderate regurgitation. - Pulmonic valve: Trivial regurgitation. - Pulmonary arteries: Systolic pressure was within the normal range. Impressions:  - Compared to the prior study in 2012 there is  no significant change.  11/13/15  - Left ventricle: The cavity size was normal. Wall thickness was   increased in a pattern of moderate LVH. Systolic function was   normal. The estimated ejection fraction was in the range of 55%   to 60%. - Aortic valve: There was moderate stenosis. There was mild   regurgitation. - Aorta: 4.8 cm measurement not very accurate poor image quality   suggest CTA/MRI to accurately measure. - Left atrium: The atrium was moderately dilated. - Atrial septum: No defect or patent foramen ovale was identified.  ASSESSMENT AND PLAN:  1. CAD-post bypass. Currently doing well with no anginal symptoms. If he ended up going for back or knee surgery, he would be of at least moderate risk. He's not having any active chest pain. His shortness of breath is likely largely from morbid obesity,  deconditioning. He is very limited in his exercise capacity based upon his neuropathy, balance, osteoarthritis of knee. If he ever did decide to go forward with knee surgery, it would likely be prudent to check nuclear stress test for further risk stratification. 2. First-degree AV block-mild. Right bundle branch block noted. We need to be careful especially with his bisoprolol. No syncope. 3. Morbid obesity-continue to encourage weight loss. His quality of life is quite limited. Knee pain. Back pain. Wife was present for discussion. He has seen nutritionist as well. We discussed limiting his carbohydrate load. 4. Hyperlipidemia-LDL 72 November of 2014. Crestor 40.  5. 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 Echocardiogram stable last showing 4.8 cm (over estimation),  CT was 4.5cm. 6. Mild to moderate aortic stenosis-no significant change. Checking echocardiogram. 7. Hypertension-currently well controlled on multidrug regimen. 8. Spinal stenosis-currently receiving Injections. Knee pain. His orthopedic issue seemed to be his main limitations. His wife  expressed this. 9. 6 month f/u  Signed, Candee Furbish, MD [facility]  06/16/2016 4:09 PM

## 2016-07-30 ENCOUNTER — Other Ambulatory Visit: Payer: Self-pay | Admitting: Family Medicine

## 2016-07-30 DIAGNOSIS — R221 Localized swelling, mass and lump, neck: Secondary | ICD-10-CM

## 2016-07-31 ENCOUNTER — Ambulatory Visit
Admission: RE | Admit: 2016-07-31 | Discharge: 2016-07-31 | Disposition: A | Discharge status: Home / Self Care | Payer: Managed Care, Other (non HMO) | Source: Ambulatory Visit | Attending: Family Medicine | Admitting: Family Medicine

## 2016-07-31 DIAGNOSIS — R221 Localized swelling, mass and lump, neck: Secondary | ICD-10-CM

## 2016-08-05 ENCOUNTER — Other Ambulatory Visit: Payer: Self-pay | Admitting: Family Medicine

## 2016-08-05 DIAGNOSIS — R221 Localized swelling, mass and lump, neck: Secondary | ICD-10-CM

## 2016-08-07 ENCOUNTER — Other Ambulatory Visit: Payer: Self-pay | Admitting: Family Medicine

## 2016-08-07 DIAGNOSIS — R1084 Generalized abdominal pain: Secondary | ICD-10-CM

## 2016-08-08 ENCOUNTER — Ambulatory Visit
Admission: RE | Admit: 2016-08-08 | Discharge: 2016-08-08 | Disposition: A | Discharge status: Home / Self Care | Payer: Managed Care, Other (non HMO) | Source: Ambulatory Visit | Attending: Family Medicine | Admitting: Family Medicine

## 2016-08-08 DIAGNOSIS — R1084 Generalized abdominal pain: Secondary | ICD-10-CM

## 2016-08-08 MED ORDER — IOPAMIDOL (ISOVUE-300) INJECTION 61%
100.0000 mL | Freq: Once | INTRAVENOUS | Status: AC | PRN
  Administered 2016-08-08: 100 mL via INTRAVENOUS

## 2016-08-19 ENCOUNTER — Ambulatory Visit
Admission: RE | Admit: 2016-08-19 | Discharge: 2016-08-19 | Disposition: A | Discharge status: Home / Self Care | Payer: Managed Care, Other (non HMO) | Source: Ambulatory Visit | Attending: Family Medicine | Admitting: Family Medicine

## 2016-08-19 DIAGNOSIS — R221 Localized swelling, mass and lump, neck: Secondary | ICD-10-CM

## 2016-08-19 MED ORDER — IOPAMIDOL (ISOVUE-300) INJECTION 61%
75.0000 mL | Freq: Once | INTRAVENOUS | Status: AC | PRN
  Administered 2016-08-19: 75 mL via INTRAVENOUS

## 2016-08-21 ENCOUNTER — Other Ambulatory Visit: Payer: Self-pay

## 2016-08-21 DIAGNOSIS — I6523 Occlusion and stenosis of bilateral carotid arteries: Secondary | ICD-10-CM

## 2016-08-22 ENCOUNTER — Encounter: Payer: Self-pay | Admitting: Vascular Surgery

## 2016-08-22 ENCOUNTER — Ambulatory Visit (INDEPENDENT_AMBULATORY_CARE_PROVIDER_SITE_OTHER): Payer: Managed Care, Other (non HMO) | Admitting: Vascular Surgery

## 2016-08-22 ENCOUNTER — Ambulatory Visit (HOSPITAL_COMMUNITY)
Admission: RE | Admit: 2016-08-22 | Discharge: 2016-08-22 | Disposition: A | Discharge status: Home / Self Care | Payer: Managed Care, Other (non HMO) | Source: Ambulatory Visit | Attending: Vascular Surgery | Admitting: Vascular Surgery

## 2016-08-22 VITALS — BP 127/74 | HR 69 | Temp 97.4°F | Resp 18 | Ht 72.0 in | Wt 290.0 lb

## 2016-08-22 DIAGNOSIS — I779 Disorder of arteries and arterioles, unspecified: Secondary | ICD-10-CM

## 2016-08-22 DIAGNOSIS — I6523 Occlusion and stenosis of bilateral carotid arteries: Secondary | ICD-10-CM

## 2016-08-22 DIAGNOSIS — I739 Peripheral vascular disease, unspecified: Principal | ICD-10-CM

## 2016-08-22 LAB — VAS US CAROTID
LCCAPDIAS: 12 cm/s
LEFT ECA DIAS: -9 cm/s
LEFT VERTEBRAL DIAS: 14 cm/s
LICAPDIAS: -34 cm/s
LICAPSYS: -111 cm/s
Left CCA dist dias: 15 cm/s
Left CCA dist sys: 72 cm/s
Left CCA prox sys: 130 cm/s
Left ICA dist dias: -24 cm/s
Left ICA dist sys: -79 cm/s
RIGHT CCA MID DIAS: 12 cm/s
RIGHT ECA DIAS: 5 cm/s
RIGHT VERTEBRAL DIAS: 1 cm/s
Right CCA prox dias: 12 cm/s
Right CCA prox sys: 83 cm/s
Right cca dist sys: -60 cm/s

## 2016-08-22 NOTE — Progress Notes (Signed)
New Carotid Patient  Referred by:  Jonathon Jordan, MD [address], [place] [address]  Reason for referral: L carotid high grade stenosis  History of Present Illness  Paul Bryant is a 71 y.o. (1945-11-06) male who presents with chief complaint: "no problems".  This patient noted a left sided neck mass on routine physical exam and go referred for a CT Neck.  On that CT, atherosclerotic arteries and probably L carotid artery stenosis was noted.  The patient was subsequently referred to this practice.  Patient has nohistory of TIA or stroke symptom.  The patient has never had amaurosis fugax or monocular blindness.  The patient has never had facial drooping or hemiplegia.  The patient has never had receptive or expressive aphasia.   The patient's risks factors for carotid disease include: HLD, HTN.  Past Medical History:  Diagnosis Date  . CAD (coronary artery disease)   . Carotid artery occlusion   . COPD (chronic obstructive pulmonary disease) (HCC)    mild  . Diverticulitis oct. 2011  . Dizziness   . Gout   . Heart attack   . Hernia    umbilical and ventral  . Hyperlipidemia   . Hypertension   . IBS (irritable bowel syndrome)   . Neuropathy ([place])   . Obesity, morbid ([place])   . OSA on CPAP   . RBBB (right bundle branch block with left anterior fascicular block)   . Sleep apnea   . Urolithiasis     Past Surgical History:  Procedure Laterality Date  . lap sigmoid colectomy due to persistent    . open heart bypass  aug. 1998  . quadruple bypass    . UMBILICAL HERNIA REPAIR     right proctoscopy  . URETERAL STENT PLACEMENT  09-18-2010   Dr Diona Fanti    Social History   Social History  . Marital status: Married    Spouse name: Joshiah Schaal  . Number of children: 2  . Years of education: N/A   Occupational History  . city [place] History Main Topics  . Smoking status: Never Smoker  . Smokeless  tobacco: Current User    Types: Snuff  . Alcohol use Yes     Comment: socially 2-3 cans of beer  . Drug use: No  . Sexual activity: Not on file   Other Topics Concern  . Not on file   Social History Narrative   Pt was adopted; Father was a doctor.    Family History  Problem Relation Age of Onset  . Throat cancer Mother   . Heart disease Father   . Heart attack Father   . Colon cancer Neg Hx   . Liver disease Neg Hx     Current Outpatient Prescriptions  Medication Sig Dispense Refill  . aspirin 81 MG tablet Take 81 mg by mouth daily.      . bisoprolol (ZEBETA) 10 MG tablet TAKE 1 TABLET(10 MG) BY MOUTH TWICE DAILY 60 tablet 6  . doxazosin (CARDURA) 1 MG tablet TK 1 T PO ONCE D 30 tablet 11  . furosemide (LASIX) 20 MG tablet TAKE 2 TABLETS BY MOUTH TWICE DAILY 120 tablet 6  . irbesartan (AVAPRO) 75 MG tablet TAKE 1 TABLET(75 MG) BY MOUTH AT BEDTIME 30 tablet 6  . nitroGLYCERIN (NITROSTAT) 0.4 MG SL tablet Place 1 tablet (0.4 mg total) under the tongue every 5 (five) minutes as needed for chest pain (  x 3 pills daily). 25 tablet prn  . rosuvastatin (CRESTOR) 40 MG tablet TAKE 1 TABLET(40 MG) BY MOUTH DAILY 30 tablet 6  . SYMBICORT 80-4.5 MCG/ACT inhaler USE 2 PUFFS TWICE DAILY THEN RINSE MOUTH 10.2 g 5  . tolterodine (DETROL) 2 MG tablet Take 2 mg by mouth daily.    . metFORMIN (GLUCOPHAGE) 500 MG tablet Take one tablet by mouth 2 times daily  12  . traMADol (ULTRAM) 50 MG tablet Take 50 mg by mouth every 6 (six) hours as needed (knee pain).     No current facility-administered medications for this visit.     Allergies  Allergen Reactions  . Penicillins Rash    hives     REVIEW OF SYSTEMS:   Cardiac:  positive for: no symptoms, negative for: Chest pain or chest pressure, Shortness of breath upon exertion and Shortness of breath when lying flat,   Vascular:  positive for: no symptoms,  negative for: Pain in calf, thigh, or hip brought on by ambulation, Pain in feet  at night that wakes you up from your sleep, Blood clot in your veins and Leg swelling  Pulmonary:  positive for: no symptoms,  negative for: Oxygen at home, Productive cough and Wheezing  Neurologic:  positive for: No symptoms, negative for: Sudden weakness in arms or legs, Sudden numbness in arms or legs, Sudden onset of difficulty speaking or slurred speech, Temporary loss of vision in one eye and Problems with dizziness  Gastrointestinal:  positive for: no symptoms, negative for: Blood in stool and Vomited blood  Genitourinary:  positive for: no symptoms, negative for: Burning when urinating and Blood in urine  Psychiatric:  positive for: no symptoms,  negative for: Major depression  Hematologic:  positive for: no symptoms,  negative for: negative for: Bleeding problems and Problems with blood clotting too easily  Dermatologic:  positive for: no symptoms, negative for: Rashes or ulcers  Constitutional:  positive for: no symptoms, negative for: Fever or chills  Ear/Nose/Throat:  positive for: no symptoms, negative for: Change in hearing, Nose bleeds and Sore throat  Musculoskeletal:  positive for: no symptoms, negative for: Back pain, Joint pain and Muscle pain   For VQI Use Only  PRE-ADM LIVING: Home  AMB STATUS: Ambulatory  CAD Sx: History of MI, but no symptoms No MI within 6 months  PRIOR CHF: None  STRESS TEST: No   Physical Examination  Vitals:   08/22/16 1447 08/22/16 1450  BP: 132/73 127/74  Pulse: 69   Resp: 18   Temp: 97.4 F (36.3 C)   TempSrc: Oral   SpO2: 96%   Weight: 290 lb (131.5 kg)   Height: 6' (1.829 m)     Body mass index is 39.33 kg/m.  General: Alert, O x 3, Obese,NAD  Head: /AT,   Ear/Nose/Throat: Hearing grossly intact, nares without erythema or drainage, oropharynx without Erythema or Exudate , Mallampati score: 3, Dentition intact  Eyes: PERRLA, EOMI,   Neck: Supple, mid-line trachea,    Pulmonary: Sym  exp, good B air movt,CTA B  Cardiac: RRR, Nl S1, S2, Murmur present: aortic listening area, No rubs, No S3,S4  Vascular: Vessel Right Left  Radial Palpable Palpable  Brachial Palpable Palpable  Carotid Palpable, No Bruit, transmitted murmur Palpable, No Bruit, transmitted murmur  Aorta Not palpable N/A  Femoral Palpable Palpable  Popliteal Not palpable Not palpable  PT Not palpable Not palpable  DP Not palpable Not palpable   Gastrointestinal: soft, non-distended, non-tender to palpation,  No guarding or rebound, no HSM, no masses, no CVAT B, No palpable prominent aortic pulse,  large pannus  Musculoskeletal: M/S 5/5 throughout , Extremities without ischemic changes , Edema in B legs, mild LDS B, B chronic venous stasis changes  Neurologic: CN 2-12 intact , Pain and light touch intact in extremities , Motor exam as listed above  Psychiatric: Judgement intact, Mood & affect appropriate for pt's clinical situation  Dermatologic: See M/S exam for extremity exam, No rashes otherwise noted  Lymph : Palpable lymph nodes: None   CT Soft Tissue Neck (08/19/16) 1. No concerning mass or nodes throughout the neck. 2. 15 mm dermal and subcutaneous mass superficial to the right scapula is likely dermal inclusion cyst. Correlate with skin exam. 3. Extensive atherosclerosis with probable high-grade proximal left ICA stenosis. Carotid Doppler is recommended.  Based on my review of this patient's neck CT with contrast: timing is inadequate to comment on the carotid arteries.  The scan is timed between the arterial and venous phase so an accurate carotid evaluation is not possible.   Non-Invasive Vascular Imaging  CAROTID DUPLEX (Date: 08/22/2016):   R ICA stenosis: 1-39%  R VA: patent and antegrade  L ICA stenosis: 1-39%  L VA: patent and antegrade   Outside Studies/Documentation 15 pages of outside documents were reviewed including: outpatient PCP charts.   Medical Decision  Making  Paul Bryant is a 71 y.o. male who presents with: B asx ICA stenosis 1-39%.   CT Neck was not timed to evaluate the carotid arteries.  No evidence of high grade L ICA stenosis on duplex though calcific can lower the degree of stenosis detected.  Given the significant radiation load this patient has received over the last year, I would avoid CTA Neck and just repeat the duplex in 6 month to double check the study.  Based on the patient's vascular studies and examination, I have offered the patient: repeat B carotid in 6 months to verify.  I discussed in depth with the patient the nature of atherosclerosis, and emphasized the importance of maximal medical management including strict control of blood pressure, blood glucose, and lipid levels, obtaining regular exercise, antiplatelet agents, and cessation of smoking.   The patient is currently on a statin:  Crestor. The patient is currently on an anti-platelet: ASA.  The patient is aware that without maximal medical management the underlying atherosclerotic disease process will progress, limiting the benefit of any interventions.  Thank you for allowing [place] to participate in this patient's care.   [name], MD, FACS [facility] Office: [phone] Pager: [phone]  08/22/2016, 3:36 PM

## 2016-08-26 NOTE — Addendum Note (Signed)
Addended by: Lianne Cure A on: 08/26/2016 02:36 PM   Modules accepted: Orders

## 2016-11-27 ENCOUNTER — Other Ambulatory Visit: Payer: Self-pay | Admitting: Cardiology

## 2016-12-02 ENCOUNTER — Encounter: Payer: Self-pay | Admitting: Cardiology

## 2016-12-02 ENCOUNTER — Encounter (INDEPENDENT_AMBULATORY_CARE_PROVIDER_SITE_OTHER): Payer: Self-pay

## 2016-12-02 ENCOUNTER — Ambulatory Visit (INDEPENDENT_AMBULATORY_CARE_PROVIDER_SITE_OTHER): Payer: Managed Care, Other (non HMO) | Admitting: Cardiology

## 2016-12-02 VITALS — BP 140/76 | HR 69 | Ht 72.0 in | Wt 287.6 lb

## 2016-12-02 DIAGNOSIS — I77810 Thoracic aortic ectasia: Secondary | ICD-10-CM | POA: Diagnosis not present

## 2016-12-02 DIAGNOSIS — I350 Nonrheumatic aortic (valve) stenosis: Secondary | ICD-10-CM

## 2016-12-02 DIAGNOSIS — I2510 Atherosclerotic heart disease of native coronary artery without angina pectoris: Secondary | ICD-10-CM

## 2016-12-02 DIAGNOSIS — E7800 Pure hypercholesterolemia, unspecified: Secondary | ICD-10-CM

## 2016-12-02 MED ORDER — ROSUVASTATIN CALCIUM 40 MG PO TABS: ORAL_TABLET | ORAL | 11 refills | Status: DC

## 2016-12-02 NOTE — Patient Instructions (Signed)
Medication Instructions:  The current medical regimen is effective;  continue present plan and medications.  Testing/Procedures: Your physician has requested that you have an echocardiogram. Echocardiography is a painless test that uses sound waves to create images of your heart. It provides your doctor with information about the size and shape of your heart and how well your heart's chambers and valves are working. This procedure takes approximately one hour. There are no restrictions for this procedure.  Follow-Up: Follow up in 6 months with Dr. Skains.  You will receive a letter in the mail 2 months before you are due.  Please call us when you receive this letter to schedule your follow up appointment.  If you need a refill on your cardiac medications before your next appointment, please call your pharmacy.  Thank you for choosing [facility]!!       

## 2016-12-02 NOTE — Progress Notes (Signed)
[facility] [address], [address], [place]  [address] Phone: [phone] Fax:  [phone]  Date:  12/02/2016   ID:  Paul Bryant, DOB November 09, 1945, MRN [id number]  PCP:  Paul Jordan, MD   History of Present Illness: Paul Bryant is a 71 y.o. male with hx of coronary artery disease status post myocardial infarction at age 85 originally treated by Dr. Melvern Bryant first treated with POBA then eventually CABG at 46 with hypertension, hyperlipidemia, obesity. He underwent a nuclear stress test on 06/17/10 which showed an ejection fraction of 46%, inferior wall infarction of moderate sized, very small degree of peri-infarct ischemia in this distribution.   LDL 72. Crestor 40.   Weight loss has been an issue. He was actually down a few pounds. This is contributing to his orthopedic issues as well. Orthopedic issues seem to be his main complaint. Back and knee.  Doppler of left lower extremity after injury to ensure that he does not have any signs of DVT was negative on 04/26/14.   Sees Dr. Annamaria Bryant. Also a nutritionist in the past.   Has shortness of breath with activity at baseline. Enjoys grandchildren. Could not hike [place] previously because of shortness of breath. Recently had a chest cold, fatigue, feels like old ages catching up to him he states.  He has mild bilateral carotid artery plaque noted. He has seen Paul Bryant with vascular surgery. Doppler was performed on 08/22/16.    Wt Readings from Last 3 Encounters:  12/02/16 287 lb 9.6 oz (130.5 kg)  08/22/16 290 lb (131.5 kg)  06/16/16 291 lb (132 kg)     Past Medical History:  Diagnosis Date  . CAD (coronary artery disease)   . Carotid artery occlusion   . COPD (chronic obstructive pulmonary disease) (HCC)    mild  . Diverticulitis oct. 2011  . Dizziness   . Gout   . Heart attack ([place])   . Hernia    umbilical and ventral  . Hyperlipidemia   . Hypertension   . IBS (irritable bowel syndrome)   . Neuropathy    . Obesity, morbid ([place])   . OSA on CPAP   . RBBB (right bundle branch block with left anterior fascicular block)   . Sleep apnea   . Urolithiasis     Past Surgical History:  Procedure Laterality Date  . lap sigmoid colectomy due to persistent    . open heart bypass  aug. 1998  . quadruple bypass    . UMBILICAL HERNIA REPAIR     right proctoscopy  . URETERAL STENT PLACEMENT  09-18-2010   Dr Diona Fanti    Current Outpatient Prescriptions  Medication Sig Dispense Refill  . aspirin 81 MG tablet Take 81 mg by mouth daily.      . bisoprolol (ZEBETA) 10 MG tablet TAKE 1 TABLET(10 MG) BY MOUTH TWICE DAILY 60 tablet 4  . doxazosin (CARDURA) 1 MG tablet TK 1 T PO ONCE D 30 tablet 11  . furosemide (LASIX) 20 MG tablet TAKE 2 TABLETS BY MOUTH TWICE DAILY 120 tablet 6  . irbesartan (AVAPRO) 75 MG tablet TAKE 1 TABLET(75 MG) BY MOUTH AT BEDTIME 30 tablet 4  . nitroGLYCERIN (NITROSTAT) 0.4 MG SL tablet Place 1 tablet (0.4 mg total) under the tongue every 5 (five) minutes as needed for chest pain (x 3 pills daily). 25 tablet prn  . rosuvastatin (CRESTOR) 40 MG tablet TAKE 1 TABLET(40 MG) BY MOUTH DAILY 30 tablet  11  . SYMBICORT 80-4.5 MCG/ACT inhaler USE 2 PUFFS TWICE DAILY THEN RINSE MOUTH 10.2 g 5  . traMADol (ULTRAM) 50 MG tablet Take 50 mg by mouth every 6 (six) hours as needed (knee pain).     No current facility-administered medications for this visit.     Allergies:    Allergies  Allergen Reactions  . Penicillins Rash    hives    Social History:  The patient  reports that he has never smoked. His smokeless tobacco use includes Snuff. He reports that he drinks alcohol. He reports that he does not use drugs.   ROS:  Please see the history of present illness. Chronic leg swelling, shortness of breath with activity, back pain, muscle pain, knee pain, sweating  Denies any fevers, chills, orthopnea, PND Unless specified above all other review of systems negative.   PHYSICAL EXAM: VS:   BP 140/76   Pulse 69   Ht 6' (1.829 m)   Wt 287 lb 9.6 oz (130.5 kg)   BMI 39.01 kg/m   GEN: Well nourished, well developed, in no acute distress  HEENT: normal  Neck: no JVD, carotid bruits, or masses Cardiac: RRR; 3/6 musical murmur, no rubs, or gallops,no edema  Respiratory:  clear to auscultation bilaterally, normal work of breathing GI: soft, nontender, nondistended, + BS, obese MS: no deformity or atrophy  Skin: warm and dry, no rash Neuro:  Alert and Oriented x 3, Strength and sensation are intact Psych: euthymic mood, full affect  EKG: EKG was ordered today. 12/02/16-sinus rhythm first-degree AV block, right bundle branch block, LVH, inferior infarct pattern. PR interval 218 ms personally viewed-prior 10/29/15 showed sinus rhythm with first-degree AV block, PR interval 212 ms with right bundle branch block, old inferior infarct pattern. Personally viewed-prior 09/15/13 Sinus rhythm, right bundle branch block, 67     ECHO: 10/21/13 - Left ventricle: The cavity size was normal. There was moderate concentric hypertrophy. Systolic function was normal. The estimated ejection fraction was in the range of 50% to 55%. Wall motion was normal; there were no regional wall motion abnormalities. Doppler parameters are consistent with abnormal left ventricular relaxation (grade 1 diastolic dysfunction). Doppler parameters are consistent with elevated ventricular end-diastolic filling pressure. - Aortic valve: Cusp separation was moderately reduced. There was mild to moderate stenosis. Mild regurgitation. Mean gradient: 18 mm Hg (S). Valve area: 1.72cm^2(VTI). Valve area: 1.52cm^2 (Vmax). - Aorta: Dilated aortic root measuring 44 mm. - Mitral valve: Calcified annulus. Mildly thickened leaflets . Trivial regurgitation. - Left atrium: The atrium was moderately dilated. - Right ventricle: The cavity size was mildly dilated. Wall thickness was normal. Systolic function  was mildly reduced. - Tricuspid valve: Moderate regurgitation. - Pulmonic valve: Trivial regurgitation. - Pulmonary arteries: Systolic pressure was within the normal range. Impressions:  - Compared to the prior study in 2012 there is no significant change.  11/13/15  - Left ventricle: The cavity size was normal. Wall thickness was   increased in a pattern of moderate LVH. Systolic function was   normal. The estimated ejection fraction was in the range of 55%   to 60%. - Aortic valve: There was moderate stenosis. There was mild   regurgitation. - Aorta: 4.8 cm measurement not very accurate poor image quality   suggest CTA/MRI to accurately measure. - Left atrium: The atrium was moderately dilated. - Atrial septum: No defect or patent foramen ovale was identified.  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 The aorta at  the level of the sinuses of Valsalva measures 4.4- 4.6 cm. The proximal ascending thoracic aorta measures 4.4-4.5 cm.  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 Further deterioration of conduction system may warrant pacemaker in the future.  Morbid obesity  - Continue to encourage weight loss. This is troublesome given his back pain. He would be at increased risk for any orthopedic surgery. Encouraged him 15 more plant-based diet, fruits, vegetables. Drastically reduced caloric intake. He has seen nutritionist in the past.  Hyperlipidemia  - Continue with Crestor 40. Goal-directed therapy.  Dilated aortic root  - CT scan showing 4.5 cm.  - We will check echocardiogram since it has been one year.  Aortic stenosis  - Previously moderate/mild. Checking echo. Musical murmur observed.   Multiple orthopedic issues including back pain, spinal stenosis, knee pain  - Continues  to be main complaint. Unfortunately this is making his deconditioning worse, shortness of breath worse.  COPD  - Dr. Annamaria Bryant. He will try to get an appointment.   6 month f/u  Signed, Candee Furbish, MD [facility]  12/02/2016 8:51 AM

## 2016-12-16 ENCOUNTER — Other Ambulatory Visit: Payer: Self-pay

## 2016-12-16 ENCOUNTER — Ambulatory Visit (HOSPITAL_COMMUNITY): Payer: Managed Care, Other (non HMO) | Attending: Cardiovascular Disease

## 2016-12-16 DIAGNOSIS — I252 Old myocardial infarction: Secondary | ICD-10-CM | POA: Insufficient documentation

## 2016-12-16 DIAGNOSIS — Z951 Presence of aortocoronary bypass graft: Secondary | ICD-10-CM | POA: Insufficient documentation

## 2016-12-16 DIAGNOSIS — J449 Chronic obstructive pulmonary disease, unspecified: Secondary | ICD-10-CM | POA: Diagnosis not present

## 2016-12-16 DIAGNOSIS — I350 Nonrheumatic aortic (valve) stenosis: Secondary | ICD-10-CM

## 2016-12-16 DIAGNOSIS — I10 Essential (primary) hypertension: Secondary | ICD-10-CM | POA: Diagnosis not present

## 2016-12-16 DIAGNOSIS — I2510 Atherosclerotic heart disease of native coronary artery without angina pectoris: Secondary | ICD-10-CM | POA: Diagnosis not present

## 2016-12-16 DIAGNOSIS — I4510 Unspecified right bundle-branch block: Secondary | ICD-10-CM | POA: Diagnosis not present

## 2016-12-16 DIAGNOSIS — E785 Hyperlipidemia, unspecified: Secondary | ICD-10-CM | POA: Diagnosis not present

## 2017-03-15 ENCOUNTER — Encounter: Payer: Self-pay | Admitting: Internal Medicine

## 2017-03-16 ENCOUNTER — Ambulatory Visit (INDEPENDENT_AMBULATORY_CARE_PROVIDER_SITE_OTHER): Payer: Managed Care, Other (non HMO) | Admitting: Internal Medicine

## 2017-03-16 ENCOUNTER — Encounter: Payer: Self-pay | Admitting: Internal Medicine

## 2017-03-16 ENCOUNTER — Telehealth: Payer: Self-pay | Admitting: Internal Medicine

## 2017-03-16 VITALS — BP 122/76 | HR 83 | Ht 72.0 in | Wt 293.6 lb

## 2017-03-16 DIAGNOSIS — I350 Nonrheumatic aortic (valve) stenosis: Secondary | ICD-10-CM | POA: Diagnosis not present

## 2017-03-16 DIAGNOSIS — J310 Chronic rhinitis: Secondary | ICD-10-CM

## 2017-03-16 DIAGNOSIS — R0602 Shortness of breath: Secondary | ICD-10-CM

## 2017-03-16 DIAGNOSIS — G4733 Obstructive sleep apnea (adult) (pediatric): Secondary | ICD-10-CM

## 2017-03-16 MED ORDER — AZELASTINE-FLUTICASONE 137-50 MCG/ACT NA SUSP: 1.0000 | Freq: Every day | NASAL | 3 refills | Status: DC

## 2017-03-16 MED ORDER — UMECLIDINIUM-VILANTEROL 62.5-25 MCG/INH IN AEPB: 1.0000 | INHALATION_SPRAY | Freq: Every day | RESPIRATORY_TRACT | 0 refills | Status: DC

## 2017-03-16 NOTE — Telephone Encounter (Signed)
Spoke with Barbaraann Rondo at [facility]. States that this order has been handled. The pt will receive his CPAP machine from [facility]. Nothing further was needed.

## 2017-03-16 NOTE — Assessment & Plan Note (Signed)
Compliance is good. I would like a little better control of looking get it. His machine is old and may be eligible for replacement. We can take the opportunity to change to auto Pap. He is quite satisfied that he benefits from CPAP and wants to continue using it.

## 2017-03-16 NOTE — Assessment & Plan Note (Signed)
We have not identified active lung disease. Symbicort isn't helping. We will see if Anoro makes any difference. Likely much more important are his cardiac disease-status post MI, aortic stenosis and also his morbid obesity. Plan-sample Anoro for trial

## 2017-03-16 NOTE — Progress Notes (Signed)
HPI M never smoker followed for Dyspnea, OSA, complicagted by morbid obesity, CAD/Hx MI  NPSG 04/27/05 Moderate OSA, AHI 16/ hr, weight 290 lbs PFT-07/24/2011-normal spirometry flows within significant response to bronchodilator. Diffusion mildly reduced. FEV1/FVC 0.77. 6 Minute Walk Test- 96%, 88%, 98% 432 meters. Note exertional desaturation.   --------------------------------------------------------------------------------------------------  02/22/15- 68 yoM never smoker followed for Dyspnea, OSA, obesity, CAD/Hx MI . Wife here FOLLOWS FOR: Wears CPAP 10 every night for about 5 hours; DME is [facility] . Pressure works well for patient. They deny snore through, daytime sleepiness. "Trying to lose weight" No wheeze, some increased dry cough, DOE.  820/18-- 71 yo M never smoker followed for Dyspnea, OSA, complicacted by morbid obesity, CAD/Hx MI , aortic stenosis, DM 2 CPAP 10/[facility]> today new machine and auto 5-20 For OSA and SOB. Increase in SOB over the past 6 months. Pt uses [facility] as DME for CPAP.  The act of stepping up to get into his bed at night makes him short of breath. He needs to wait a minute to catch his breath before he puts his CPAP on. Occasional minor dry cough. No benefit from Symbicort. Previous testing has not shown reactive airways disease. He blames "hot weather" for shortness of breath. Cardiology continues to follow his aortic stenosis Complains of watery postnasal drip. CPAP download-100% compliance averaging 6 hours 38 minutes per night with residual AHI 8.1/hour. Machine is old.  ROS-see HPI   + = positive Constitutional:   No-   weight loss, night sweats, fevers, chills, fatigue, lassitude. HEENT:   No-  headaches, difficulty swallowing, tooth/dental problems, sore throat,       No-  sneezing, itching, ear ache, nasal congestion, +post nasal drip,  CV:  No-   chest pain, orthopnea, PND, swelling in lower extremities, anasarca, dizziness, palpitations Resp: +  shortness of breath with exertion, + at rest.              No-   productive cough,  + non-productive cough,  No- coughing up of blood.              No-   change in color of mucus.  No- wheezing.   Skin: No-   rash or lesions. GI:  No-   heartburn, indigestion, abdominal pain, nausea, vomiting,  GU: MS:  +  joint pain or swelling.   Neuro-     nothing unusual Psych:  No- change in mood or affect. No depression or anxiety.  No memory loss.  OBJ- General- Alert, Oriented, Affect-appropriate, Distress- none acute, + morbid obesity Skin- rash-none, lesions- none, excoriation- none. Tanned Lymphadenopathy- none Head- atraumatic            Eyes- Gross vision intact, PERRLA, conjunctivae clear secretions            Ears- Hearing, canals-normal            Nose- Clear, no-Septal dev, mucus, polyps, erosion, perforation             Throat- Mallampati IV , mucosa clear , drainage- none, tonsils- atrophic Neck- flexible , trachea midline, no stridor , thyroid nl, carotid no bruit Chest - symmetrical excursion , unlabored           Heart/CV- RRR , + 2/6 Systolic murmur , no gallop  , no rub, nl s1 s2                           - JVD-  none , edema- 1+, stasis changes- none, varices- none           Lung- clear to P&A, wheeze- none, cough- none , dullness-none, rub- none           Chest wall-  Abd-  Br/ Gen/ Rectal- Not done, not indicated Extrem- cyanosis- none, clubbing, none, atrophy- none, strength- nl Neuro- grossly intact to observation

## 2017-03-16 NOTE — Assessment & Plan Note (Signed)
Bothersome postnasal drip, having to blow his nose a lot in the morning Plan-coupon and prescription to try Dymista nasal spray

## 2017-03-16 NOTE — Patient Instructions (Signed)
Sample Anoro Ellipta inhaler    Inhale 1 puff, once daily     Try this instead of Symbicort  Sample Dymista nasal spray    Try 1-2 puffs each nostril once daily at bedtime. See if it begins to help the nasal drip.  Order- DME [facility]- If eligible, please replace old CPAP machine, change to auto 5-20, mask of choice, humidifier, supplies, AirView  Dx OSA

## 2017-03-16 NOTE — Assessment & Plan Note (Signed)
Followed by cardiology. Not sure if this is significantly limiting yet.

## 2017-03-17 ENCOUNTER — Other Ambulatory Visit: Payer: Self-pay | Admitting: Internal Medicine

## 2017-03-17 MED ORDER — AZELASTINE HCL 0.1 % NA SOLN: 2.0000 | Freq: Two times a day (BID) | NASAL | 6 refills | Status: DC

## 2017-03-17 MED ORDER — FLUTICASONE PROPIONATE 50 MCG/ACT NA SUSP: 2.0000 | Freq: Every day | NASAL | 6 refills | Status: DC

## 2017-03-17 NOTE — Telephone Encounter (Signed)
Received a PA for the dymista for the pt.  This has been changed to fluticasone and asteline.  Nothing further is needed.

## 2017-03-20 ENCOUNTER — Ambulatory Visit: Payer: Managed Care, Other (non HMO) | Admitting: Vascular Surgery

## 2017-03-20 ENCOUNTER — Encounter (HOSPITAL_COMMUNITY): Payer: Managed Care, Other (non HMO)

## 2017-03-24 ENCOUNTER — Telehealth: Payer: Self-pay | Admitting: Internal Medicine

## 2017-03-24 MED ORDER — UMECLIDINIUM-VILANTEROL 62.5-25 MCG/INH IN AEPB: 1.0000 | INHALATION_SPRAY | Freq: Every day | RESPIRATORY_TRACT | 4 refills | Status: DC

## 2017-03-24 NOTE — Telephone Encounter (Signed)
Patient's wife is returning phone call.  °

## 2017-03-24 NOTE — Telephone Encounter (Signed)
Spoke with patient's wife. Advised that I sent in the Anoro rx for the patient. She stated that they were ok with the order going to [facility]. She stated that she normally comes to his appts to relay information, but she was late this time. She apologizes for any confusion.   Nothing else was needed at time of call.

## 2017-03-24 NOTE — Telephone Encounter (Signed)
Instructions      Return in about 1 year (around 03/16/2018).  Sample Anoro Ellipta inhaler    Inhale 1 puff, once daily     Try this instead of Symbicort  Sample Dymista nasal spray    Try 1-2 puffs each nostril once daily at bedtime. See if it begins to help the nasal drip.  Order- [facility]- If eligible, please replace old CPAP machine, change to auto 5-20, mask of choice, humidifier, supplies, AirView  Dx OSA       LMTCB

## 2017-04-27 ENCOUNTER — Other Ambulatory Visit: Payer: Self-pay | Admitting: Cardiology

## 2017-04-28 ENCOUNTER — Telehealth: Payer: Self-pay | Admitting: Internal Medicine

## 2017-04-28 DIAGNOSIS — G4733 Obstructive sleep apnea (adult) (pediatric): Secondary | ICD-10-CM

## 2017-04-28 NOTE — Telephone Encounter (Signed)
Spoke with pt, states he is not doing well on Anoro- states that the powder chokes him.  Pt is requesting to go back to Symbicort.  Per last OV note, pt was switched from Symbicort to Anoro because he was not doing well on Symbicort.  I asked pt about this, states that the Symbicort was better than Anoro and that he had an easier time using the Symbicort inhaler than the Anoro inhaler.  I asked pt if he would like additional recs from CY, pt states he would like to go back to Symbicort unless CY believes something else may work better for him.  Pt uses [facility] on [address].  CY please advise.  Thanks!

## 2017-04-28 NOTE — Telephone Encounter (Signed)
LMTCB

## 2017-04-28 NOTE — Telephone Encounter (Signed)
Suggest we offer sample of Bevespi to try. The device works like a Symbicort device- 2 puffs, twice daily, without the dry powder.    See if he thinks any of these makes a useful difference in his breathing.

## 2017-04-29 MED ORDER — GLYCOPYRROLATE-FORMOTEROL 9-4.8 MCG/ACT IN AERO: 2.0000 | INHALATION_SPRAY | Freq: Two times a day (BID) | RESPIRATORY_TRACT | 0 refills | Status: DC

## 2017-04-29 NOTE — Telephone Encounter (Signed)
Called and spoke with pt and he is aware of the bevespi sample that has been left up front--he is aware to use 2 sprays BID just like the symbicort.     Pt wanted to see if we could get him a new cpap from [facility].  He stated that he thinks it is time for one.  CY please advise. Thanks

## 2017-04-29 NOTE — Telephone Encounter (Signed)
Spoke with the pt and notified of recs per CDY  Order sent to [facility]

## 2017-04-29 NOTE — Telephone Encounter (Signed)
Ok to order- [facility]- please replace old CPAP machine, change to auto 5-20, mask of choice, humidifier, supplies, AirView    Dx OSA

## 2017-05-03 ENCOUNTER — Other Ambulatory Visit: Payer: Self-pay | Admitting: Cardiology

## 2017-05-18 ENCOUNTER — Telehealth: Payer: Self-pay | Admitting: Internal Medicine

## 2017-05-18 MED ORDER — GLYCOPYRROLATE-FORMOTEROL 9-4.8 MCG/ACT IN AERO: 2.0000 | INHALATION_SPRAY | Freq: Two times a day (BID) | RESPIRATORY_TRACT | 5 refills | Status: DC

## 2017-05-18 NOTE — Telephone Encounter (Signed)
[facility] to [facility]. Pt called to be notified. Nothing further is needed.

## 2017-05-26 ENCOUNTER — Other Ambulatory Visit: Payer: Self-pay | Admitting: Internal Medicine

## 2017-06-09 ENCOUNTER — Ambulatory Visit: Payer: Managed Care, Other (non HMO) | Admitting: Emergency Medicine

## 2017-06-17 ENCOUNTER — Telehealth: Payer: Self-pay | Admitting: Internal Medicine

## 2017-06-17 NOTE — Telephone Encounter (Signed)
A PA for Bevespi was initiated through [email or web address] (Key: [id number]) but no documentation was made regarding this.  Checked the status of PA today via [email or web address]- this was denied d/t covered alternatives not being tried and failed.  Covered alternatives are Anoro and Stiolto.  Pharmacy initiating PA is [facility] on [address].    CY please advise on covered alternatives.  Thanks!

## 2017-06-22 ENCOUNTER — Telehealth: Payer: Self-pay | Admitting: Cardiology

## 2017-06-22 ENCOUNTER — Ambulatory Visit
Admission: RE | Admit: 2017-06-22 | Discharge: 2017-06-22 | Disposition: A | Discharge status: Home / Self Care | Payer: Managed Care, Other (non HMO) | Source: Ambulatory Visit | Attending: Family Medicine | Admitting: Family Medicine

## 2017-06-22 ENCOUNTER — Other Ambulatory Visit: Payer: Self-pay | Admitting: Family Medicine

## 2017-06-22 DIAGNOSIS — J441 Chronic obstructive pulmonary disease with (acute) exacerbation: Secondary | ICD-10-CM

## 2017-06-22 MED ORDER — TIOTROPIUM BROMIDE-OLODATEROL 2.5-2.5 MCG/ACT IN AERS: 2.0000 | INHALATION_SPRAY | Freq: Every day | RESPIRATORY_TRACT | 12 refills | Status: DC

## 2017-06-22 NOTE — Telephone Encounter (Signed)
Since his insurance won't cover Bevespi, Please change to Stiolto, # 1, inhale 2 puffs once daily, ref x 12

## 2017-06-22 NOTE — Telephone Encounter (Signed)
Left message to call back  

## 2017-06-22 NOTE — Telephone Encounter (Signed)
Called pt and advised message from the provider. Pt understood and verbalized understanding. Nothing further is needed.   Rx sent into pharmacy.  

## 2017-06-22 NOTE — Telephone Encounter (Signed)
Patient calling back and states that he had an episode on 11/21 where he experienced tightness in his chest and arms and SOB. He states that he had just got home from work and was sitting down watching TV when it happened. He states that he took NTG x 1 and his symptoms resolved. Patient denies any recurrent episodes and denies having any symptoms at this time. Patient states that he has been a little SOB with activity for the past week and also states that his feet are a little swollen. Patient states that he has been taking his meds as directed which include lasix 40 mg BID. Patient does not have any vitals to offer and states that he does not weigh himself daily. Patient states that he typically does not add salt to his food but states that he did eat ham over the holiday. Patient states that he has an appointment with his PCP today. Advised patient to elevate his legs, limit the amount of salt in his diet which include canned foods and processed foods, and weigh himself daily. Patient already has appointment with Cecilie Kicks, NP on 12/17. Advised patient to keep appointment with his PCP today and to let us know if his symptoms worsen before his appointment with LI on 12/17. ER precautions reviewed with patient. Patient verbalized understanding and thanked me for the call.

## 2017-06-22 NOTE — Addendum Note (Signed)
Addended by: Jannette Spanner on: 06/22/2017 09:08 AM   Modules accepted: Orders

## 2017-06-22 NOTE — Telephone Encounter (Signed)
New message    Patient calling regarding episode of chest on 11/21. Please call    Pt c/o of Chest Pain: STAT if CP now or developed within 24 hours  1. Are you having CP right now? NO  2. Are you experiencing any other symptoms (ex. SOB, nausea, vomiting, sweating)?  SOB  3. How long have you been experiencing CP? First time in years  4. Is your CP continuous or coming and going? Coming and going  5. Have you taken Nitroglycerin? Yes on Wednesday 11/21 ?

## 2017-06-27 ENCOUNTER — Other Ambulatory Visit: Payer: Self-pay | Admitting: Cardiology

## 2017-06-29 ENCOUNTER — Ambulatory Visit
Admission: RE | Admit: 2017-06-29 | Discharge: 2017-06-29 | Disposition: A | Discharge status: Home / Self Care | Payer: Managed Care, Other (non HMO) | Source: Ambulatory Visit | Attending: Family Medicine | Admitting: Family Medicine

## 2017-06-29 ENCOUNTER — Other Ambulatory Visit: Payer: Self-pay | Admitting: Family Medicine

## 2017-06-29 DIAGNOSIS — Z09 Encounter for follow-up examination after completed treatment for conditions other than malignant neoplasm: Secondary | ICD-10-CM

## 2017-07-10 NOTE — Progress Notes (Signed)
Cardiology Office Note   Date:  07/14/2017   ID:  Paul Bryant, DOB [birth year], MRN [id number]  PCP:  Jonathon Jordan, MD  Cardiologist:  Dr. Marlou Porch    Chief Complaint  Patient presents with  . Chest Pain    acute HF      History of Present Illness: Paul Bryant is a 71 y.o. male who presents for chest pain and some edema.   He has a hx of coronary artery disease status post myocardial infarction at age 27 originally treated by Dr. Melvern Banker first treated with POBA then eventually CABG at 60 in 1998.  LIMA to LAD, VG seq to diagonals one and two, VG to OM and VG to PDA by Dr. Roxy Manns.  Last cath was 03/2005 by Dr. Melvern Banker  EF 45% all grafts patent.  At that time patent renal arteries as well.   He also has hypertension, hyperlipidemia, obesity. He underwent a nuclear stress test on 06/17/10 which showed an ejection fraction of 46%, inferior wall infarction of moderate sized, very small degree of peri-infarct ischemia in this distribution. He has mild bilateral carotid artery plaque noted. He has seen [name] with vascular surgery. Doppler was performed on 08/22/16.   Today he presented due to CHF.  He has been having more DOE over last few months.  Wed before Thanksgiving he developed cest pressure and bil arm pain.  He took his NTG and symptoms improved.  He saw his PCP and his lasix was increased, CXR with enlargement of cardiac silhouette with pulmonary vascular congestion, + pulmonary edema.    Follow up with 18 lb wt loss with lasix and with heart moderately enlarged.  Pulmonary edema has resolved.  Since that time he has felt better no further chest tightness but still with DOE.   Last echo 12/16/16 with normal EF dilated ascending aorta at 43 mm stable echo.  Today is feeling more to his normal but concerned. No further chest pain.    Past Medical History:  Diagnosis Date  . CAD (coronary artery disease)   . Carotid artery occlusion   . COPD (chronic obstructive pulmonary  disease) (HCC)    mild  . Diverticulitis oct. 2011  . Dizziness   . Gout   . Heart attack ([place])   . Hernia    umbilical and ventral  . Hyperlipidemia   . Hypertension   . IBS (irritable bowel syndrome)   . Neuropathy   . Obesity, morbid ([place])   . OSA on CPAP   . RBBB (right bundle branch block with left anterior fascicular block)   . S/P cardiac catheterization 04/10/2005   patent grafts  . Sleep apnea   . Urolithiasis     Past Surgical History:  Procedure Laterality Date  . lap sigmoid colectomy due to persistent    . open heart bypass  aug. 1998  . quadruple bypass    . UMBILICAL HERNIA REPAIR     right proctoscopy  . URETERAL STENT PLACEMENT  09-18-2010   Dr Diona Fanti     Current Outpatient Medications  Medication Sig Dispense Refill  . aspirin 81 MG tablet Take 81 mg by mouth daily.      [name] azelastine (ASTELIN) 0.1 % nasal spray Place 2 sprays into both nostrils 2 (two) times daily. Use in each nostril as directed 30 mL 6  . bisoprolol (ZEBETA) 10 MG tablet TAKE 1 TABLET(10 MG) BY MOUTH TWICE DAILY 60 tablet 7  . doxazosin (CARDURA) 1  MG tablet TK 1 T PO ONCE D 30 tablet 11  . fluticasone (FLONASE) 50 MCG/ACT nasal spray Place 2 sprays into both nostrils daily. 16 g 6  . furosemide (LASIX) 20 MG tablet TAKE 2 TABLETS BY MOUTH TWICE DAILY 180 tablet 2  . Glycopyrrolate-Formoterol (BEVESPI AEROSPHERE) 9-4.8 MCG/ACT AERO Inhale 2 puffs into the lungs 2 (two) times daily. 1 Inhaler 5  . irbesartan (AVAPRO) 75 MG tablet TAKE 1 TABLET(75 MG) BY MOUTH AT BEDTIME 30 tablet 6  . rosuvastatin (CRESTOR) 40 MG tablet TAKE 1 TABLET(40 MG) BY MOUTH DAILY 30 tablet 11  . SYMBICORT 80-4.5 MCG/ACT inhaler USE 2 PUFFS TWICE DAILY THEN RINSE MOUTH 10.2 g 5  . Tiotropium Bromide-Olodaterol (STIOLTO RESPIMAT) 2.5-2.5 MCG/ACT AERS Inhale 2 puffs into the lungs daily. 1 Inhaler 12  . traMADol (ULTRAM) 50 MG tablet Take 50 mg by mouth every 6 (six) hours as needed (knee pain).    .  isosorbide mononitrate (IMDUR) 30 MG 24 hr tablet Take 0.5 tablets (15 mg total) by mouth daily. 45 tablet 3  . nitroGLYCERIN (NITROSTAT) 0.4 MG SL tablet Place 1 tablet (0.4 mg total) under the tongue every 5 (five) minutes as needed for chest pain (max 3 doses). 75 tablet 1   No current facility-administered medications for this visit.     Allergies:   Penicillins    Social History:  The patient  reports that  has never smoked. His smokeless tobacco use includes snuff. He reports that he drinks alcohol. He reports that he does not use drugs.   Family History:  The patient's family history includes Heart attack in his father; Heart disease in his father; Throat cancer in his mother.    ROS:  General:no colds or fevers, + weight increase with HF now imrpoved with wt loss Skin:no rashes or ulcers HEENT:no blurred vision, no congestion CV:see HPI PUL:see HPI GI:no diarrhea constipation or melena, no indigestion GU:no hematuria, no dysuria MS:no joint pain, no claudication Neuro:no syncope, no lightheadedness Endo:+ diabetes stable, no thyroid disease  Wt Readings from Last 3 Encounters:  07/13/17 292 lb 12.8 oz (132.8 kg)  03/16/17 293 lb 9.6 oz (133.2 kg)  12/02/16 287 lb 9.6 oz (130.5 kg)     PHYSICAL EXAM: VS:  BP 136/70   Pulse 79   Resp 16   Ht 6' (1.829 m)   Wt 292 lb 12.8 oz (132.8 kg)   SpO2 97%   BMI 39.71 kg/m  , BMI Body mass index is 39.71 kg/m. General:Pleasant affect, NAD Skin:Warm and dry, brisk capillary refill HEENT:normocephalic, sclera clear, mucus membranes moist Neck:supple, no JVD, no bruits  Heart:S1S2 RRR without murmur, gallup, rub or click Lungs:clear without rales, rhonchi, or wheezes VZD:GLOV, non tender, + BS, do not palpate liver spleen or masses Ext:no to trace lower ext edema, 2+ pedal pulses, 2+ radial pulses Neuro:alert and oriented X 3, MAE, follows commands, + facial symmetry    EKG:  EKG is ordered today. The ekg ordered today  demonstrates SR with 1st degree AV block and RBBB no acute changes though 1st degree AV block is longer.   Recent Labs: No results found for requested labs within last 8760 hours.    Lipid Panel No results found for: CHOL, TRIG, HDL, CHOLHDL, VLDL, LDLCALC, LDLDIRECT     Other studies Reviewed: Additional studies/ records that were reviewed today include: . Echo 12/16/16   Study Conclusions  - Left ventricle: The cavity size was normal. Wall thickness was  increased in a pattern of moderate LVH. Systolic function was   normal. The estimated ejection fraction was in the range of 55%   to 60%. Wall motion was normal; there were no regional wall   motion abnormalities. Features are consistent with a pseudonormal   left ventricular filling pattern, with concomitant abnormal   relaxation and increased filling pressure (grade 2 diastolic   dysfunction). Doppler parameters are consistent with   indeterminate ventricular filling pressure. - Aortic valve: Valve mobility was restricted. There was mild   stenosis. There was no regurgitation. - Aorta: Ascending aorta diameter: 43.55 mm (ED). - Ascending aorta: The ascending aorta was mildly dilated. - Mitral valve: Transvalvular velocity was within the normal range.   There was no evidence for stenosis. There was no regurgitation. - Left atrium: The atrium was severely dilated. - Right ventricle: The cavity size was normal. Wall thickness was   normal. Systolic function was normal.  ASSESSMENT AND PLAN:  1.  Chest pain resolved with diuresing.  May have been from CHF.  His grafts are 71 years old and last cath 2006.  Discussed with Dr. Marlou Porch and if return of pain he would need cardiac cath.  Pt and wife aware.  2.  DOE while acute SOB improved still with DOE, he is diabetic, so I did add imdur 15 mg to meds and will see him back in 4 weeks --if no improvement would recommend Rt and Lt cardiac cath. Concerned this is ischemia mediated  dyspnea.  Continue other meds including current lasix.    3.  COPD followed by pulmonary and he has hx PNA, CXR without PNA  4.  DM-2  5. OSA with CPAP.     Current medicines are reviewed with the patient today.  The patient Has no concerns regarding medicines.  The following changes have been made:  See above Labs/ tests ordered today include:see above  Disposition:   FU:  see above  Signed, Cecilie Kicks, NP  07/14/2017 6:56 PM    [facility], [place], [place], [place] Phone: [phone]; Fax: [phone]

## 2017-07-13 ENCOUNTER — Ambulatory Visit: Payer: Managed Care, Other (non HMO) | Admitting: Cardiology

## 2017-07-13 ENCOUNTER — Encounter: Payer: Self-pay | Admitting: Cardiology

## 2017-07-13 VITALS — BP 136/70 | HR 79 | Resp 16 | Ht 72.0 in | Wt 292.8 lb

## 2017-07-13 DIAGNOSIS — I77810 Thoracic aortic ectasia: Secondary | ICD-10-CM

## 2017-07-13 DIAGNOSIS — I440 Atrioventricular block, first degree: Secondary | ICD-10-CM

## 2017-07-13 DIAGNOSIS — G4733 Obstructive sleep apnea (adult) (pediatric): Secondary | ICD-10-CM

## 2017-07-13 DIAGNOSIS — I2510 Atherosclerotic heart disease of native coronary artery without angina pectoris: Secondary | ICD-10-CM

## 2017-07-13 DIAGNOSIS — I5031 Acute diastolic (congestive) heart failure: Secondary | ICD-10-CM | POA: Diagnosis not present

## 2017-07-13 DIAGNOSIS — R079 Chest pain, unspecified: Secondary | ICD-10-CM | POA: Diagnosis not present

## 2017-07-13 MED ORDER — ISOSORBIDE MONONITRATE ER 30 MG PO TB24: 15.0000 mg | ORAL_TABLET | Freq: Every day | ORAL | 3 refills | Status: DC

## 2017-07-13 NOTE — Patient Instructions (Addendum)
Your physician has recommended you make the following change in your medication:  START ISOSORBIDE 15 [place]    Your physician recommends that you schedule a follow-up appointment in:  [place] NP [place]

## 2017-07-14 ENCOUNTER — Encounter: Payer: Self-pay | Admitting: Family Medicine

## 2017-07-14 ENCOUNTER — Other Ambulatory Visit: Payer: Self-pay | Admitting: Cardiology

## 2017-07-14 ENCOUNTER — Encounter: Payer: Self-pay | Admitting: Cardiology

## 2017-07-14 MED ORDER — NITROGLYCERIN 0.4 MG SL SUBL: 0.4000 mg | SUBLINGUAL_TABLET | SUBLINGUAL | 1 refills | Status: AC | PRN

## 2017-07-14 NOTE — Telephone Encounter (Signed)
Pt's medication was sent to pt's pharmacy as requested. Confirmation received.  °

## 2017-08-13 NOTE — Progress Notes (Signed)
Cardiology Office Note   Date:  08/14/2017   ID:  Paul Bryant, DOB 04/09/1946, MRN [id number]  PCP:  Jonathon Jordan, MD  Cardiologist:  Dr. Marlou Porch    Chief Complaint  Patient presents with  . Shortness of Breath      History of Present Illness: Paul Bryant is a 72 y.o. male who presents for chest pain, CAD, and DOE.  On last visit I added imdur and that has helped the with some SOB and no further pain.  He is still DOE.  We discussed last time if symptoms continue then would proceed with cath.   His edema has improved.     He has a hx of coronary artery disease status post myocardial infarction at age 44 originally treated by Dr. Melvern Banker first treated with POBA then eventually CABG at 15 in 1998.  LIMA to LAD, VG seq to diagonals one and two, VG to OM and VG to PDA by Dr. Roxy Manns.  Last cath was 03/2005 by Dr. Melvern Banker  EF 45% all grafts patent.  At that time patent renal arteries as well.   He also has hypertension, hyperlipidemia, obesity. He underwent a nuclear stress test on 06/17/10 which showed an ejection fraction of 46%, inferior wall infarction of moderate sized, very small degree of peri-infarct ischemia in this distribution. He has mild bilateral carotid artery plaque noted. He has seen [name] with vascular surgery. Doppler was performed on 08/22/16.  Also has lower ext dopplers ordered.   Today as above.  No chest pain but with diabetes and and DOE and grafts are 72 years old and last cath 2006 with patent grafts then.  We discussed cardiac cath. His wife was with him as well.     Past Medical History:  Diagnosis Date  . CAD (coronary artery disease)   . Carotid artery occlusion   . COPD (chronic obstructive pulmonary disease) (HCC)    mild  . Diverticulitis oct. 2011  . Dizziness   . Gout   . Heart attack ([place])   . Hernia    umbilical and ventral  . Hyperlipidemia   . Hypertension   . IBS (irritable bowel syndrome)   . Neuropathy   . Obesity, morbid  ([place])   . OSA on CPAP   . RBBB (right bundle branch block with left anterior fascicular block)   . S/P cardiac catheterization 04/10/2005   patent grafts  . Sleep apnea   . Urolithiasis     Past Surgical History:  Procedure Laterality Date  . lap sigmoid colectomy due to persistent    . open heart bypass  aug. 1998  . quadruple bypass    . UMBILICAL HERNIA REPAIR     right proctoscopy  . URETERAL STENT PLACEMENT  09-18-2010   Dr Diona Fanti     Current Outpatient Medications  Medication Sig Dispense Refill  . aspirin 81 MG tablet Take 81 mg by mouth daily.      [name] azelastine (ASTELIN) 0.1 % nasal spray Place 2 sprays into both nostrils 2 (two) times daily. Use in each nostril as directed 30 mL 6  . bisoprolol (ZEBETA) 10 MG tablet TAKE 1 TABLET(10 MG) BY MOUTH TWICE DAILY 60 tablet 7  . doxazosin (CARDURA) 1 MG tablet TK 1 T PO ONCE D 30 tablet 11  . fluticasone (FLONASE) 50 MCG/ACT nasal spray Place 2 sprays into both nostrils daily. 16 g 6  . furosemide (LASIX) 20 MG tablet TAKE 2 TABLETS BY  MOUTH TWICE DAILY 180 tablet 2  . Glycopyrrolate-Formoterol (BEVESPI AEROSPHERE) 9-4.8 MCG/ACT AERO Inhale 2 puffs into the lungs 2 (two) times daily. 1 Inhaler 5  . irbesartan (AVAPRO) 75 MG tablet TAKE 1 TABLET(75 MG) BY MOUTH AT BEDTIME 30 tablet 6  . isosorbide mononitrate (IMDUR) 30 MG 24 hr tablet Take 0.5 tablets (15 mg total) by mouth daily. 45 tablet 3  . nitroGLYCERIN (NITROSTAT) 0.4 MG SL tablet Place 1 tablet (0.4 mg total) under the tongue every 5 (five) minutes as needed for chest pain (max 3 doses). 75 tablet 1  . rosuvastatin (CRESTOR) 40 MG tablet TAKE 1 TABLET(40 MG) BY MOUTH DAILY 30 tablet 11  . Tiotropium Bromide-Olodaterol (STIOLTO RESPIMAT) 2.5-2.5 MCG/ACT AERS Inhale 2 puffs into the lungs daily. 1 Inhaler 12  . traMADol (ULTRAM) 50 MG tablet Take 50 mg by mouth every 6 (six) hours as needed (knee pain).     No current facility-administered medications for this visit.       Allergies:   Penicillins    Social History:  The patient  reports that  has never smoked. His smokeless tobacco use includes snuff. He reports that he drinks alcohol. He reports that he does not use drugs.   Family History:  The patient's family history includes Heart attack in his father; Heart disease in his father; Throat cancer in his mother.    ROS:  General:no colds or fevers, no weight changes Skin:no rashes or ulcers HEENT:no blurred vision, no congestion CV:see HPI PUL:see HPI GI:no diarrhea constipation or melena, no indigestion GU:no hematuria, no dysuria MS:no joint pain, no claudication Neuro:no syncope, no lightheadedness Endo:+ diabetes, no thyroid disease  Wt Readings from Last 3 Encounters:  08/14/17 292 lb 12.8 oz (132.8 kg)  07/13/17 292 lb 12.8 oz (132.8 kg)  03/16/17 293 lb 9.6 oz (133.2 kg)     PHYSICAL EXAM: VS:  BP 114/70   Pulse 70   Resp 16   Ht 6' (1.829 m)   Wt 292 lb 12.8 oz (132.8 kg)   SpO2 97%   BMI 39.71 kg/m  , BMI Body mass index is 39.71 kg/m. General:Pleasant affect, NAD Skin:Warm and dry, brisk capillary refill HEENT:normocephalic, sclera clear, mucus membranes moist Neck:supple, no JVD, no bruits  Heart:S1S2 RRR without murmur, gallup, rub or click Lungs:clear without rales, rhonchi, or wheezes VFI:EPPI, non tender, + BS, do not palpate liver spleen or masses Ext:no to tr lower ext edema, 2+ pedal pulses, 2+ radial pulses Neuro:alert and oriented X 3, MAE, follows commands, + facial symmetry    EKG:  EKG is NOT ordered today.  Recent Labs: No results found for requested labs within last 8760 hours.    Lipid Panel No results found for: CHOL, TRIG, HDL, CHOLHDL, VLDL, LDLCALC, LDLDIRECT     Other studies Reviewed: Additional studies/ records that were reviewed today include:  ECHO:  Study Conclusions  - Left ventricle: The cavity size was normal. Wall thickness was   increased in a pattern of moderate LVH.  Systolic function was   normal. The estimated ejection fraction was in the range of 55%   to 60%. Wall motion was normal; there were no regional wall   motion abnormalities. Features are consistent with a pseudonormal   left ventricular filling pattern, with concomitant abnormal   relaxation and increased filling pressure (grade 2 diastolic   dysfunction). Doppler parameters are consistent with   indeterminate ventricular filling pressure. - Aortic valve: Valve mobility was restricted. There was  mild   stenosis. There was no regurgitation. - Aorta: Ascending aorta diameter: 43.55 mm (ED). - Ascending aorta: The ascending aorta was mildly dilated. - Mitral valve: Transvalvular velocity was within the normal range.   There was no evidence for stenosis. There was no regurgitation. - Left atrium: The atrium was severely dilated. - Right ventricle: The cavity size was normal. Wall thickness was   normal. Systolic function was normal.  ASSESSMENT AND PLAN:  1.  Recent angina and continued DOE.  With his CAD and grafts of 72 years old, diabetes will do cath to know if PCI could help or how much we need to push meds.  This may be due to diastolic HF alone, but would prefer to know.  Imdur has helped.  Pt agrees.  Follow up with Dr. Marlou Porch post procedure.   The patient understands that risks included but are not limited to stroke (1 in 1000), death (1 in 82), kidney failure [usually temporary] (1 in 500), bleeding (1 in 200), allergic reaction [possibly serious] (1 in 200).   2.  CAD s/p CABG with last cath 2006 and patent grafts at that time.    3.   HLD on statin crestor  4.   Chronic diastolic HF but increased over last couple of months.  On lasix.    5.  DM-2 per PCP  6.  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  The patient Has no concerns regarding medicines.  The following changes have been made:  See above Labs/ tests  ordered today include:see above  Disposition:   FU:  see above  Signed, Cecilie Kicks, NP  08/14/2017 9:06 AM    [facility], [place], [place], [place] Phone: [phone]; Fax: [phone]

## 2017-08-13 NOTE — H&P (View-Only) (Signed)
Cardiology Office Note   Date:  08/14/2017   ID:  Paul Bryant, DOB [birth year], MRN [id number]  PCP:  Paul Jordan, MD  Cardiologist:  Dr. Marlou Porch    Chief Complaint  Patient presents with  . Shortness of Breath      History of Present Illness: Paul Bryant is a 72 y.o. male who presents for chest pain, CAD, and DOE.  On last visit I added imdur and that has helped the with some SOB and no further pain.  He is still DOE.  We discussed last time if symptoms continue then would proceed with cath.   His edema has improved.     He has a hx of coronary artery disease status post myocardial infarction at age 33 originally treated by Dr. Melvern Banker first treated with POBA then eventually CABG at 21 in 1998.  LIMA to LAD, VG seq to diagonals one and two, VG to OM and VG to PDA by Dr. Roxy Manns.  Last cath was 03/2005 by Dr. Melvern Banker  EF 45% all grafts patent.  At that time patent renal arteries as well.   He also has hypertension, hyperlipidemia, obesity. He underwent a nuclear stress test on 06/17/10 which showed an ejection fraction of 46%, inferior wall infarction of moderate sized, very small degree of peri-infarct ischemia in this distribution. He has mild bilateral carotid artery plaque noted. He has seen [name] with vascular surgery. Doppler was performed on 08/22/16.  Also has lower ext dopplers ordered.   Today as above.  No chest pain but with diabetes and and DOE and grafts are 72 years old and last cath 2006 with patent grafts then.  We discussed cardiac cath. His wife was with him as well.     Past Medical History:  Diagnosis Date  . CAD (coronary artery disease)   . Carotid artery occlusion   . COPD (chronic obstructive pulmonary disease) (HCC)    mild  . Diverticulitis oct. 2011  . Dizziness   . Gout   . Heart attack ([place])   . Hernia    umbilical and ventral  . Hyperlipidemia   . Hypertension   . IBS (irritable bowel syndrome)   . Neuropathy   . Obesity, morbid  ([place])   . OSA on CPAP   . RBBB (right bundle branch block with left anterior fascicular block)   . S/P cardiac catheterization 04/10/2005   patent grafts  . Sleep apnea   . Urolithiasis     Past Surgical History:  Procedure Laterality Date  . lap sigmoid colectomy due to persistent    . open heart bypass  aug. 1998  . quadruple bypass    . UMBILICAL HERNIA REPAIR     right proctoscopy  . URETERAL STENT PLACEMENT  09-18-2010   Dr Diona Fanti     Current Outpatient Medications  Medication Sig Dispense Refill  . aspirin 81 MG tablet Take 81 mg by mouth daily.      [name] azelastine (ASTELIN) 0.1 % nasal spray Place 2 sprays into both nostrils 2 (two) times daily. Use in each nostril as directed 30 mL 6  . bisoprolol (ZEBETA) 10 MG tablet TAKE 1 TABLET(10 MG) BY MOUTH TWICE DAILY 60 tablet 7  . doxazosin (CARDURA) 1 MG tablet TK 1 T PO ONCE D 30 tablet 11  . fluticasone (FLONASE) 50 MCG/ACT nasal spray Place 2 sprays into both nostrils daily. 16 g 6  . furosemide (LASIX) 20 MG tablet TAKE 2 TABLETS BY  MOUTH TWICE DAILY 180 tablet 2  . Glycopyrrolate-Formoterol (BEVESPI AEROSPHERE) 9-4.8 MCG/ACT AERO Inhale 2 puffs into the lungs 2 (two) times daily. 1 Inhaler 5  . irbesartan (AVAPRO) 75 MG tablet TAKE 1 TABLET(75 MG) BY MOUTH AT BEDTIME 30 tablet 6  . isosorbide mononitrate (IMDUR) 30 MG 24 hr tablet Take 0.5 tablets (15 mg total) by mouth daily. 45 tablet 3  . nitroGLYCERIN (NITROSTAT) 0.4 MG SL tablet Place 1 tablet (0.4 mg total) under the tongue every 5 (five) minutes as needed for chest pain (max 3 doses). 75 tablet 1  . rosuvastatin (CRESTOR) 40 MG tablet TAKE 1 TABLET(40 MG) BY MOUTH DAILY 30 tablet 11  . Tiotropium Bromide-Olodaterol (STIOLTO RESPIMAT) 2.5-2.5 MCG/ACT AERS Inhale 2 puffs into the lungs daily. 1 Inhaler 12  . traMADol (ULTRAM) 50 MG tablet Take 50 mg by mouth every 6 (six) hours as needed (knee pain).     No current facility-administered medications for this visit.       Allergies:   Penicillins    Social History:  The patient  reports that  has never smoked. His smokeless tobacco use includes snuff. He reports that he drinks alcohol. He reports that he does not use drugs.   Family History:  The patient's family history includes Heart attack in his father; Heart disease in his father; Throat cancer in his mother.    ROS:  General:no colds or fevers, no weight changes Skin:no rashes or ulcers HEENT:no blurred vision, no congestion CV:see HPI PUL:see HPI GI:no diarrhea constipation or melena, no indigestion GU:no hematuria, no dysuria MS:no joint pain, no claudication Neuro:no syncope, no lightheadedness Endo:+ diabetes, no thyroid disease  Wt Readings from Last 3 Encounters:  08/14/17 292 lb 12.8 oz (132.8 kg)  07/13/17 292 lb 12.8 oz (132.8 kg)  03/16/17 293 lb 9.6 oz (133.2 kg)     PHYSICAL EXAM: VS:  BP 114/70   Pulse 70   Resp 16   Ht 6' (1.829 m)   Wt 292 lb 12.8 oz (132.8 kg)   SpO2 97%   BMI 39.71 kg/m  , BMI Body mass index is 39.71 kg/m. General:Pleasant affect, NAD Skin:Warm and dry, brisk capillary refill HEENT:normocephalic, sclera clear, mucus membranes moist Neck:supple, no JVD, no bruits  Heart:S1S2 RRR without murmur, gallup, rub or click Lungs:clear without rales, rhonchi, or wheezes HCW:CBJS, non tender, + BS, do not palpate liver spleen or masses Ext:no to tr lower ext edema, 2+ pedal pulses, 2+ radial pulses Neuro:alert and oriented X 3, MAE, follows commands, + facial symmetry    EKG:  EKG is NOT ordered today.  Recent Labs: No results found for requested labs within last 8760 hours.    Lipid Panel No results found for: CHOL, TRIG, HDL, CHOLHDL, VLDL, LDLCALC, LDLDIRECT     Other studies Reviewed: Additional studies/ records that were reviewed today include:  ECHO:  Study Conclusions  - Left ventricle: The cavity size was normal. Wall thickness was   increased in a pattern of moderate LVH.  Systolic function was   normal. The estimated ejection fraction was in the range of 55%   to 60%. Wall motion was normal; there were no regional wall   motion abnormalities. Features are consistent with a pseudonormal   left ventricular filling pattern, with concomitant abnormal   relaxation and increased filling pressure (grade 2 diastolic   dysfunction). Doppler parameters are consistent with   indeterminate ventricular filling pressure. - Aortic valve: Valve mobility was restricted. There was  mild   stenosis. There was no regurgitation. - Aorta: Ascending aorta diameter: 43.55 mm (ED). - Ascending aorta: The ascending aorta was mildly dilated. - Mitral valve: Transvalvular velocity was within the normal range.   There was no evidence for stenosis. There was no regurgitation. - Left atrium: The atrium was severely dilated. - Right ventricle: The cavity size was normal. Wall thickness was   normal. Systolic function was normal.  ASSESSMENT AND PLAN:  1.  Recent angina and continued DOE.  With his CAD and grafts of 72 years old, diabetes will do cath to know if PCI could help or how much we need to push meds.  This may be due to diastolic HF alone, but would prefer to know.  Imdur has helped.  Pt agrees.  Follow up with Dr. Marlou Porch post procedure.   The patient understands that risks included but are not limited to stroke (1 in 1000), death (1 in 65), kidney failure [usually temporary] (1 in 500), bleeding (1 in 200), allergic reaction [possibly serious] (1 in 200).   2.  CAD s/p CABG with last cath 2006 and patent grafts at that time.    3.   HLD on statin crestor  4.   Chronic diastolic HF but increased over last couple of months.  On lasix.    5.  DM-2 per PCP  6.  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  The patient Has no concerns regarding medicines.  The following changes have been made:  See above Labs/ tests  ordered today include:see above  Disposition:   FU:  see above  Signed, Cecilie Kicks, NP  08/14/2017 9:06 AM    [address], [place], [place], [place] Phone: [phone]; Fax: [phone]

## 2017-08-14 ENCOUNTER — Encounter: Payer: Self-pay | Admitting: Cardiology

## 2017-08-14 ENCOUNTER — Ambulatory Visit (INDEPENDENT_AMBULATORY_CARE_PROVIDER_SITE_OTHER): Payer: Managed Care, Other (non HMO) | Admitting: Cardiology

## 2017-08-14 ENCOUNTER — Encounter (INDEPENDENT_AMBULATORY_CARE_PROVIDER_SITE_OTHER): Payer: Self-pay

## 2017-08-14 VITALS — BP 114/70 | HR 70 | Resp 16 | Ht 72.0 in | Wt 292.8 lb

## 2017-08-14 DIAGNOSIS — I77810 Thoracic aortic ectasia: Secondary | ICD-10-CM | POA: Diagnosis not present

## 2017-08-14 DIAGNOSIS — I2510 Atherosclerotic heart disease of native coronary artery without angina pectoris: Secondary | ICD-10-CM | POA: Diagnosis not present

## 2017-08-14 DIAGNOSIS — R0789 Other chest pain: Secondary | ICD-10-CM | POA: Diagnosis not present

## 2017-08-14 DIAGNOSIS — E118 Type 2 diabetes mellitus with unspecified complications: Secondary | ICD-10-CM | POA: Diagnosis not present

## 2017-08-14 DIAGNOSIS — E7800 Pure hypercholesterolemia, unspecified: Secondary | ICD-10-CM

## 2017-08-14 DIAGNOSIS — Z951 Presence of aortocoronary bypass graft: Secondary | ICD-10-CM | POA: Diagnosis not present

## 2017-08-14 DIAGNOSIS — R0609 Other forms of dyspnea: Secondary | ICD-10-CM | POA: Diagnosis not present

## 2017-08-14 LAB — CBC
HEMATOCRIT: 39.5 % (ref 37.5–51.0)
HEMOGLOBIN: 14.3 g/dL (ref 13.0–17.7)
MCH: 31.6 pg (ref 26.6–33.0)
MCHC: 36.2 g/dL — AB (ref 31.5–35.7)
MCV: 87 fL (ref 79–97)
Platelets: 133 10*3/uL — ABNORMAL LOW (ref 150–379)
RBC: 4.52 x10E6/uL (ref 4.14–5.80)
RDW: 12.6 % (ref 12.3–15.4)
WBC: 8.5 10*3/uL (ref 3.4–10.8)

## 2017-08-14 LAB — BASIC METABOLIC PANEL
BUN/Creatinine Ratio: 15 (ref 10–24)
BUN: 14 mg/dL (ref 8–27)
CALCIUM: 9.5 mg/dL (ref 8.6–10.2)
CHLORIDE: 99 mmol/L (ref 96–106)
CO2: 23 mmol/L (ref 20–29)
CREATININE: 0.91 mg/dL (ref 0.76–1.27)
GFR calc Af Amer: 98 mL/min/{1.73_m2} (ref >59)
GFR, EST NON AFRICAN AMERICAN: 84 mL/min/{1.73_m2} (ref >59)
Glucose: 198 mg/dL — ABNORMAL HIGH (ref 65–99)
Potassium: 4.6 mmol/L (ref 3.5–5.2)
Sodium: 138 mmol/L (ref 134–144)

## 2017-08-14 LAB — PROTIME-INR
INR: 1.1 (ref 0.8–1.2)
PROTHROMBIN TIME: 11.2 s (ref 9.1–12.0)

## 2017-08-14 NOTE — Patient Instructions (Addendum)
Medication Instructions:  Your physician recommends that you continue on your current medications as directed. Please refer to the Current Medication list given to you today.   Labwork: TODAY:  BMET, CBC, & PT/INR  Testing/Procedures: Your physician has requested that you have a cardiac catheterization. Cardiac catheterization is used to diagnose and/or treat various heart conditions. Doctors may recommend this procedure for a number of different reasons. The most common reason is to evaluate chest pain. Chest pain can be a symptom of coronary artery disease (CAD), and cardiac catheterization can show whether plaque is narrowing or blocking your heart's arteries. This procedure is also used to evaluate the valves, as well as measure the blood flow and oxygen levels in different parts of your heart. For further information please visit [email or web address]. Please follow instruction sheet, as given.    Follow-Up: Your physician recommends that you schedule a follow-up appointment in: 09/10/17 ARRIVE AT 8:15 TO SEE Cecilie Kicks, NP   Any Other Special Instructions Will Be Listed Below (If Applicable).    HEART CATH INSTRUCTIONS:    You are scheduled for a Cardiac Catheterization on Tuesday, January 29 with Dr. Daneen Schick.  1. Please arrive at the [facility] (Main Entrance A) at [facility]: [place], [place] [address] at 10:00 AM (two hours before your procedure to ensure your preparation). Free valet parking service is available.   Special note: Every effort is made to have your procedure done on time. Please understand that emergencies sometimes delay scheduled procedures.  2. Diet: Do not eat or drink anything after midnight prior to your procedure except sips of water to take medications.  3. Labs: WILL BE DRAWN IN THE OFFICE TODAY  4. Medication instructions in preparation for your procedure:  Stop taking, HOLD LASIX ON THE DAY OF YOUR PROCEDURE     On the  morning of your procedure, take your Aspirin and any morning medicines NOT listed above.  You may use sips of water.  5. Plan for one night stay--bring personal belongings. 6. Bring a current list of your medications and current insurance cards. 7. You MUST have a responsible person to drive you home. 8. Someone MUST be with you the first 24 hours after you arrive home or your discharge will be delayed. 9. Please wear clothes that are easy to get on and off and wear slip-on shoes.  Thank you for allowing [place] to care for you!   -- [facility]    If you need a refill on your cardiac medications before your next appointment, please call your pharmacy.

## 2017-08-17 ENCOUNTER — Other Ambulatory Visit: Payer: Self-pay

## 2017-08-17 DIAGNOSIS — I739 Peripheral vascular disease, unspecified: Secondary | ICD-10-CM

## 2017-08-24 ENCOUNTER — Telehealth: Payer: Self-pay | Admitting: *Deleted

## 2017-08-24 NOTE — Telephone Encounter (Signed)
Patient contacted pre-catheterization at [facility] scheduled for: 08/25/17 12 noon Verified arrival time and place: [facility] NT/ Main A 10 AM  Confirmed AM meds to be taken pre-cath with sip of water: ASA 81 mg am of  Other morning medications listed except lasix.  Hold: Lasix AM of  Confirmed patient has responsible person to drive home post procedure and observe patient for 24 hours: yes

## 2017-08-25 ENCOUNTER — Ambulatory Visit (HOSPITAL_COMMUNITY)
Admission: RE | Disposition: A | Discharge status: Home / Self Care | Payer: Self-pay | Source: Ambulatory Visit | Attending: Interventional Cardiology

## 2017-08-25 ENCOUNTER — Ambulatory Visit (HOSPITAL_COMMUNITY)
Admission: RE | Admit: 2017-08-25 | Discharge: 2017-08-25 | Disposition: A | Discharge status: Home / Self Care | Payer: Managed Care, Other (non HMO) | Source: Ambulatory Visit | Attending: Interventional Cardiology | Admitting: Interventional Cardiology

## 2017-08-25 DIAGNOSIS — Z7982 Long term (current) use of aspirin: Secondary | ICD-10-CM | POA: Diagnosis not present

## 2017-08-25 DIAGNOSIS — Z720 Tobacco use: Secondary | ICD-10-CM | POA: Diagnosis not present

## 2017-08-25 DIAGNOSIS — Z87442 Personal history of urinary calculi: Secondary | ICD-10-CM | POA: Diagnosis not present

## 2017-08-25 DIAGNOSIS — I2510 Atherosclerotic heart disease of native coronary artery without angina pectoris: Secondary | ICD-10-CM

## 2017-08-25 DIAGNOSIS — I25810 Atherosclerosis of coronary artery bypass graft(s) without angina pectoris: Secondary | ICD-10-CM | POA: Insufficient documentation

## 2017-08-25 DIAGNOSIS — Z7951 Long term (current) use of inhaled steroids: Secondary | ICD-10-CM | POA: Insufficient documentation

## 2017-08-25 DIAGNOSIS — I5042 Chronic combined systolic (congestive) and diastolic (congestive) heart failure: Secondary | ICD-10-CM | POA: Diagnosis not present

## 2017-08-25 DIAGNOSIS — J449 Chronic obstructive pulmonary disease, unspecified: Secondary | ICD-10-CM | POA: Insufficient documentation

## 2017-08-25 DIAGNOSIS — I700 Atherosclerosis of aorta: Secondary | ICD-10-CM | POA: Diagnosis not present

## 2017-08-25 DIAGNOSIS — E785 Hyperlipidemia, unspecified: Secondary | ICD-10-CM | POA: Diagnosis present

## 2017-08-25 DIAGNOSIS — I110 Hypertensive heart disease with heart failure: Secondary | ICD-10-CM | POA: Diagnosis not present

## 2017-08-25 DIAGNOSIS — Y713 Surgical instruments, materials and cardiovascular devices (including sutures) associated with adverse incidents: Secondary | ICD-10-CM | POA: Insufficient documentation

## 2017-08-25 DIAGNOSIS — Z960 Presence of urogenital implants: Secondary | ICD-10-CM | POA: Diagnosis not present

## 2017-08-25 DIAGNOSIS — T82858A Stenosis of vascular prosthetic devices, implants and grafts, initial encounter: Secondary | ICD-10-CM | POA: Diagnosis not present

## 2017-08-25 DIAGNOSIS — I350 Nonrheumatic aortic (valve) stenosis: Secondary | ICD-10-CM | POA: Diagnosis not present

## 2017-08-25 DIAGNOSIS — I6523 Occlusion and stenosis of bilateral carotid arteries: Secondary | ICD-10-CM | POA: Insufficient documentation

## 2017-08-25 DIAGNOSIS — Z880 Allergy status to penicillin: Secondary | ICD-10-CM | POA: Diagnosis not present

## 2017-08-25 DIAGNOSIS — E1140 Type 2 diabetes mellitus with diabetic neuropathy, unspecified: Secondary | ICD-10-CM | POA: Diagnosis not present

## 2017-08-25 DIAGNOSIS — Z79899 Other long term (current) drug therapy: Secondary | ICD-10-CM | POA: Insufficient documentation

## 2017-08-25 DIAGNOSIS — Z9889 Other specified postprocedural states: Secondary | ICD-10-CM | POA: Diagnosis not present

## 2017-08-25 DIAGNOSIS — G4733 Obstructive sleep apnea (adult) (pediatric): Secondary | ICD-10-CM | POA: Diagnosis not present

## 2017-08-25 DIAGNOSIS — I4510 Unspecified right bundle-branch block: Secondary | ICD-10-CM | POA: Insufficient documentation

## 2017-08-25 DIAGNOSIS — I2582 Chronic total occlusion of coronary artery: Secondary | ICD-10-CM | POA: Diagnosis not present

## 2017-08-25 DIAGNOSIS — Z6839 Body mass index (BMI) 39.0-39.9, adult: Secondary | ICD-10-CM | POA: Diagnosis not present

## 2017-08-25 DIAGNOSIS — R0609 Other forms of dyspnea: Secondary | ICD-10-CM | POA: Diagnosis present

## 2017-08-25 DIAGNOSIS — I252 Old myocardial infarction: Secondary | ICD-10-CM | POA: Insufficient documentation

## 2017-08-25 HISTORY — PX: LEFT HEART CATH AND CORS/GRAFTS ANGIOGRAPHY: CATH118250

## 2017-08-25 LAB — POCT ACTIVATED CLOTTING TIME: ACTIVATED CLOTTING TIME: 186 s

## 2017-08-25 SURGERY — LEFT HEART CATH AND CORS/GRAFTS ANGIOGRAPHY
Anesthesia: LOCAL

## 2017-08-25 MED ORDER — SODIUM CHLORIDE 0.9% FLUSH: 3.0000 mL | INTRAVENOUS | Status: DC | PRN

## 2017-08-25 MED ORDER — LIDOCAINE HCL 1 % IJ SOLN
INTRAMUSCULAR | Status: AC
  Filled 2017-08-25: qty 20

## 2017-08-25 MED ORDER — SODIUM CHLORIDE 0.9 % IV SOLN: 250.0000 mL | INTRAVENOUS | Status: DC | PRN

## 2017-08-25 MED ORDER — HEPARIN SODIUM (PORCINE) 1000 UNIT/ML IJ SOLN
INTRAMUSCULAR | Status: DC | PRN
  Administered 2017-08-25: 6500 [IU] via INTRAVENOUS

## 2017-08-25 MED ORDER — MIDAZOLAM HCL 2 MG/2ML IJ SOLN
INTRAMUSCULAR | Status: AC
  Filled 2017-08-25: qty 2

## 2017-08-25 MED ORDER — SODIUM CHLORIDE 0.9 % WEIGHT BASED INFUSION: 1.0000 mL/kg/h | INTRAVENOUS | Status: DC

## 2017-08-25 MED ORDER — HEPARIN (PORCINE) IN NACL 2-0.9 UNIT/ML-% IJ SOLN
INTRAMUSCULAR | Status: AC | PRN
  Administered 2017-08-25: 1000 mL

## 2017-08-25 MED ORDER — IOPAMIDOL (ISOVUE-370) INJECTION 76%
INTRAVENOUS | Status: AC
  Filled 2017-08-25: qty 125

## 2017-08-25 MED ORDER — HEPARIN (PORCINE) IN NACL 2-0.9 UNIT/ML-% IJ SOLN
INTRAMUSCULAR | Status: AC
  Filled 2017-08-25: qty 1000

## 2017-08-25 MED ORDER — IOPAMIDOL (ISOVUE-370) INJECTION 76%
INTRAVENOUS | Status: AC
  Filled 2017-08-25: qty 50

## 2017-08-25 MED ORDER — LIDOCAINE HCL (PF) 1 % IJ SOLN
INTRAMUSCULAR | Status: DC | PRN
  Administered 2017-08-25: 15 mL
  Administered 2017-08-25: 2 mL

## 2017-08-25 MED ORDER — FENTANYL CITRATE (PF) 100 MCG/2ML IJ SOLN
INTRAMUSCULAR | Status: DC | PRN
  Administered 2017-08-25 (×2): 50 ug via INTRAVENOUS

## 2017-08-25 MED ORDER — ONDANSETRON HCL 4 MG/2ML IJ SOLN: 4.0000 mg | Freq: Four times a day (QID) | INTRAMUSCULAR | Status: DC | PRN

## 2017-08-25 MED ORDER — ASPIRIN 81 MG PO CHEW: 81.0000 mg | CHEWABLE_TABLET | ORAL | Status: DC

## 2017-08-25 MED ORDER — HEPARIN SODIUM (PORCINE) 1000 UNIT/ML IJ SOLN
INTRAMUSCULAR | Status: AC
  Filled 2017-08-25: qty 1

## 2017-08-25 MED ORDER — FENTANYL CITRATE (PF) 100 MCG/2ML IJ SOLN
INTRAMUSCULAR | Status: AC
  Filled 2017-08-25: qty 2

## 2017-08-25 MED ORDER — SODIUM CHLORIDE 0.9 % IV SOLN: INTRAVENOUS | Status: DC

## 2017-08-25 MED ORDER — SODIUM CHLORIDE 0.9% FLUSH: 3.0000 mL | Freq: Two times a day (BID) | INTRAVENOUS | Status: DC

## 2017-08-25 MED ORDER — VERAPAMIL HCL 2.5 MG/ML IV SOLN
INTRAVENOUS | Status: AC
  Filled 2017-08-25: qty 2

## 2017-08-25 MED ORDER — MIDAZOLAM HCL 2 MG/2ML IJ SOLN
INTRAMUSCULAR | Status: DC | PRN
  Administered 2017-08-25 (×3): 1 mg via INTRAVENOUS

## 2017-08-25 MED ORDER — SODIUM CHLORIDE 0.9 % WEIGHT BASED INFUSION: 3.0000 mL/kg/h | INTRAVENOUS | Status: DC

## 2017-08-25 MED ORDER — VERAPAMIL HCL 2.5 MG/ML IV SOLN
INTRAVENOUS | Status: DC | PRN
  Administered 2017-08-25: 10 mL via INTRA_ARTERIAL

## 2017-08-25 MED ORDER — IOPAMIDOL (ISOVUE-370) INJECTION 76%
INTRAVENOUS | Status: DC | PRN
  Administered 2017-08-25: 160 mL via INTRA_ARTERIAL

## 2017-08-25 MED ORDER — ACETAMINOPHEN 325 MG PO TABS: 650.0000 mg | ORAL_TABLET | ORAL | Status: DC | PRN

## 2017-08-25 MED ORDER — OXYCODONE HCL 5 MG PO TABS: 5.0000 mg | ORAL_TABLET | ORAL | Status: DC | PRN

## 2017-08-25 MED ORDER — SODIUM CHLORIDE 0.9 % IV SOLN
INTRAVENOUS | Status: DC
Start: 2017-08-25 — End: 2017-08-25
  Administered 2017-08-25: 11:00:00 via INTRAVENOUS

## 2017-08-25 SURGICAL SUPPLY — 16 items
CATH INFINITI 5FR AL1 (CATHETERS) ×2 IMPLANT
CATH INFINITI 5FR JL5 (CATHETERS) ×2 IMPLANT
CATH INFINITI 5FR MPB2 (CATHETERS) ×2 IMPLANT
CATH INFINITI 5FR MULTPACK ANG (CATHETERS) ×2 IMPLANT
COVER PRB 48X5XTLSCP FOLD TPE (BAG) ×1 IMPLANT
COVER PROBE 5X48 (BAG) ×1
DEVICE RAD COMP TR BAND LRG (VASCULAR PRODUCTS) ×2 IMPLANT
GLIDESHEATH SLEND A-KIT 6F 22G (SHEATH) ×2 IMPLANT
GUIDEWIRE INQWIRE 1.5J.035X260 (WIRE) ×1 IMPLANT
INQWIRE 1.5J .035X260CM (WIRE) ×2
KIT HEART LEFT (KITS) ×2 IMPLANT
PACK CARDIAC CATHETERIZATION (CUSTOM PROCEDURE TRAY) ×2 IMPLANT
SHEATH PINNACLE 5F 10CM (SHEATH) ×2 IMPLANT
TRANSDUCER W/STOPCOCK (MISCELLANEOUS) ×2 IMPLANT
TUBING CIL FLEX 10 FLL-RA (TUBING) ×2 IMPLANT
WIRE EMERALD 3MM-J .035X150CM (WIRE) ×2 IMPLANT

## 2017-08-25 NOTE — Interval H&P Note (Signed)
Cath Lab Visit (complete for each Cath Lab visit)  Clinical Evaluation Leading to the Procedure:   ACS: No.  Non-ACS:    Anginal Classification: CCS Bryant  Anti-ischemic medical therapy: Maximal Therapy (2 or more classes of medications)  Non-Invasive Test Results: No non-invasive testing performed  Prior CABG: Previous CABG      History and Physical Interval Note:  08/25/2017 12:18 PM  Paul Bryant  has presented today for surgery, with the diagnosis of cad, dyspnea upon excertion, chest discomfort  The various methods of treatment have been discussed with the patient and family. After consideration of risks, benefits and other options for treatment, the patient has consented to  Procedure(s): LEFT HEART CATH AND CORS/GRAFTS ANGIOGRAPHY (N/A) as a surgical intervention .  The patient's history has been reviewed, patient examined, no change in status, stable for surgery.  I have reviewed the patient's chart and labs.  Questions were answered to the patient's satisfaction.     Paul Bryant

## 2017-08-25 NOTE — Discharge Instructions (Signed)
Femoral Site Care °Refer to this sheet in the next few weeks. These instructions provide you with information about caring for yourself after your procedure. Your health care provider may also give you more specific instructions. Your treatment has been planned according to current medical practices, but problems sometimes occur. Call your health care provider if you have any problems or questions after your procedure. °What can I expect after the procedure? °After your procedure, it is typical to have the following: °· Bruising at the site that usually fades within 1-2 weeks. °· Blood collecting in the tissue (hematoma) that may be painful to the touch. It should usually decrease in size and tenderness within 1-2 weeks. ° °Follow these instructions at home: °· Take medicines only as directed by your health care provider. °· You may shower 24-48 hours after the procedure or as directed by your health care provider. Remove the bandage (dressing) and gently wash the site with plain soap and water. Pat the area dry with a clean towel. Do not rub the site, because this may cause bleeding. °· Do not take baths, swim, or use a hot tub until your health care provider approves. °· Check your insertion site every day for redness, swelling, or drainage. °· Do not apply powder or lotion to the site. °· Limit use of stairs to twice a day for the first 2-3 days or as directed by your health care provider. °· Do not squat for the first 2-3 days or as directed by your health care provider. °· Do not lift over 10 lb (4.5 kg) for 5 days after your procedure or as directed by your health care provider. °· Ask your health care provider when it is okay to: °? Return to work or school. °? Resume usual physical activities or sports. °? Resume sexual activity. °· Do not drive home if you are discharged the same day as the procedure. Have someone else drive you. °· You may drive 24 hours after the procedure unless otherwise instructed by  your health care provider. °· Do not operate machinery or power tools for 24 hours after the procedure or as directed by your health care provider. °· If your procedure was done as an outpatient procedure, which means that you went home the same day as your procedure, a responsible adult should be with you for the first 24 hours after you arrive home. °· Keep all follow-up visits as directed by your health care provider. This is important. °Contact a health care provider if: °· You have a fever. °· You have chills. °· You have increased bleeding from the site. Hold pressure on the site. °Get help right away if: °· You have unusual pain at the site. °· You have redness, warmth, or swelling at the site. °· You have drainage (other than a small amount of blood on the dressing) from the site. °· The site is bleeding, and the bleeding does not stop after 30 minutes of holding steady pressure on the site. °· Your leg or foot becomes pale, cool, tingly, or numb. °This information is not intended to replace advice given to you by your health care provider. Make sure you discuss any questions you have with your health care provider. °Document Released: 03/17/2014 Document Revised: 12/20/2015 Document Reviewed: 01/31/2014 °Elsevier Interactive Patient Education © 2018 [employer] ° °Radial Site Care °Refer to this sheet in the next few weeks. These instructions provide you with information about caring for yourself after your procedure. Your health   care provider may also give you more specific instructions. Your treatment has been planned according to current medical practices, but problems sometimes occur. Call your health care provider if you have any problems or questions after your procedure. °What can I expect after the procedure? °After your procedure, it is typical to have the following: °· Bruising at the radial site that usually fades within 1-2 weeks. °· Blood collecting in the tissue (hematoma) that may be  painful to the touch. It should usually decrease in size and tenderness within 1-2 weeks. ° °Follow these instructions at home: °· Take medicines only as directed by your health care provider. °· You may shower 24-48 hours after the procedure or as directed by your health care provider. Remove the bandage (dressing) and gently wash the site with plain soap and water. Pat the area dry with a clean towel. Do not rub the site, because this may cause bleeding. °· Do not take baths, swim, or use a hot tub until your health care provider approves. °· Check your insertion site every day for redness, swelling, or drainage. °· Do not apply powder or lotion to the site. °· Do not flex or bend the affected arm for 24 hours or as directed by your health care provider. °· Do not push or pull heavy objects with the affected arm for 24 hours or as directed by your health care provider. °· Do not lift over 10 lb (4.5 kg) for 5 days after your procedure or as directed by your health care provider. °· Ask your health care provider when it is okay to: °? Return to work or school. °? Resume usual physical activities or sports. °? Resume sexual activity. °· Do not drive home if you are discharged the same day as the procedure. Have someone else drive you. °· You may drive 24 hours after the procedure unless otherwise instructed by your health care provider. °· Do not operate machinery or power tools for 24 hours after the procedure. °· If your procedure was done as an outpatient procedure, which means that you went home the same day as your procedure, a responsible adult should be with you for the first 24 hours after you arrive home. °· Keep all follow-up visits as directed by your health care provider. This is important. °Contact a health care provider if: °· You have a fever. °· You have chills. °· You have increased bleeding from the radial site. Hold pressure on the site. °Get help right away if: °· You have unusual pain at the  radial site. °· You have redness, warmth, or swelling at the radial site. °· You have drainage (other than a small amount of blood on the dressing) from the radial site. °· The radial site is bleeding, and the bleeding does not stop after 30 minutes of holding steady pressure on the site. °· Your arm or hand becomes pale, cool, tingly, or numb. °This information is not intended to replace advice given to you by your health care provider. Make sure you discuss any questions you have with your health care provider. °Document Released: 08/16/2010 Document Revised: 12/20/2015 Document Reviewed: 01/30/2014 °Elsevier Interactive Patient Education © 2018 [employer] Inc[employer] ° ° °

## 2017-08-25 NOTE — Progress Notes (Signed)
Site area: Rt fem Art Site Prior to Removal:  Level 0 Pressure Applied For: 25 min Manual:   yes Patient Status During Pull:  A/O Post Pull Site:  Level 0 Post Pull Instructions Given:  Post instructions given and pt understands Post Pull Pulses Present: 2+ rt dp Dressing Applied:  Tegaderm and a 4x4 Bedrest begins @ 15:10:00 Comments: Pt leaves cath lab holding area in stable condition. Rt groin is unremarkable and dressing site is CDI.  Pt has a TR band on let radial site and has coban. Lt radial is unremarkable and site is CDI.

## 2017-08-26 ENCOUNTER — Encounter (HOSPITAL_COMMUNITY): Payer: Self-pay | Admitting: Interventional Cardiology

## 2017-08-26 MED FILL — Lidocaine HCl Local Inj 1%: INTRAMUSCULAR | Qty: 20 | Status: AC

## 2017-09-02 ENCOUNTER — Encounter: Payer: Self-pay | Admitting: Cardiology

## 2017-09-09 NOTE — Progress Notes (Signed)
Cardiology Office Note   Date:  09/10/2017   ID:  Paul Bryant, DOB [birth year], MRN [id number]  PCP:  Jonathon Jordan, MD  Cardiologist:  Dr. Marlou Porch    Chief Complaint  Patient presents with  . Shortness of Breath    post cath      History of Present Illness: Paul Bryant is a 72 y.o. male who presents for post cath, after chest pain and DOE.  Imdur had helped but DOE had continued.    He has ahx of coronary artery disease status post myocardial infarction at age 16 originally treated by Dr. Melvern Banker first treated with POBA then eventually CABG at [facility]. LIMA to LAD, VG seq to diagonals one and two, VG to OM and VG to PDA by Dr. Roxy Manns. Last cath was 03/2005 by Dr. Melvern Banker EF 45% all grafts patent. At that time patent renal arteries as well. He also has hypertension, hyperlipidemia, obesity. He underwent a nuclear stress test on 06/17/10 which showed an ejection fraction of 46%, inferior wall infarction of moderate sized, very small degree of peri-infarct ischemia in this distribution. He has mild bilateral carotid artery plaque noted. He has seen [name] with vascular surgery. Doppler was performed on 08/22/16.  Also has lower ext dopplers ordered.    With continued DOE we proceeded with cardiac cath.  Severe native disease and total occlusion of of VG to PDA,  Diffuse 50-60% stenosis of the distal one half of the saphenous vein graft to the ramus intermedius, Eccentric 95% stenosis in the sequential SV graft to the first and second diagonal, Widely patent left internal mammary artery to the LAD.  Decreased LV systolic function with EF 35%, significant elevation in LVEDP, 26 mmHg.   The only interventional option in the bypass graft territory is the relatively focal stenosis in the sequential graft to the diagonals.  The graft is calcified.  The graft is so would have a higher risk of atheroembolic complications,  Uptitrate diuretic therapy.  Consider high risk PCI on  the sequential graft to diagonal #1 and #2 versus continued medical therapy.  Catheterization procedure was very complicated due to the patient's obesity, calcified aorta, and difficulty with catheter engagement from both the left radial and femoral.   Here today for follow up.  I had discussed with Dr. Marlou Porch with plan to diuresis and increase ARB.  Also wt loss.   Reviewed results of cardiac cath.  On cath EF is 35%, they were concerned why no cath prior to the recent one.  He had ben fairly stable until chest pain and echo in the spring 2018 was normal.  Now EF decreased.  Most likely did not need cath until recent episode.    Today still with SOB but overall feels better.  We discussed diuresing, and I will increase ARB and imdur.  We discussed diet at length.  He has tried several but when comes off diet weight returns.  He has trouble exercising due to knee pain and back pain.  He does do stationary bike.  He would like to return to cardiac rehab and that seems reasonable.  Reviewed study with Pt and his wife.      Past Medical History:  Diagnosis Date  . CAD (coronary artery disease)   . Carotid artery occlusion   . COPD (chronic obstructive pulmonary disease) (HCC)    mild  . Diverticulitis oct. 2011  . Dizziness   . Gout   . Heart attack (  [place])   . Hernia    umbilical and ventral  . Hyperlipidemia   . Hypertension   . IBS (irritable bowel syndrome)   . Neuropathy   . Obesity, morbid ([place])   . OSA on CPAP   . RBBB (right bundle branch block with left anterior fascicular block)   . S/P cardiac catheterization 04/10/2005   ,  2019 with occluded grafts, LIMA is patent  . Sleep apnea   . Urolithiasis     Past Surgical History:  Procedure Laterality Date  . lap sigmoid colectomy due to persistent    . LEFT HEART CATH AND CORS/GRAFTS ANGIOGRAPHY N/A 08/25/2017   Procedure: LEFT HEART CATH AND CORS/GRAFTS ANGIOGRAPHY;  Surgeon: Belva Crome, MD;  Location: [facility];   Service: Cardiovascular;  Laterality: N/A;  . open heart bypass  aug. 1998  . quadruple bypass    . UMBILICAL HERNIA REPAIR     right proctoscopy  . URETERAL STENT PLACEMENT  09-18-2010   Dr Diona Fanti     Current Outpatient Medications  Medication Sig Dispense Refill  . aspirin 81 MG tablet Take 81 mg by mouth daily.      . bisoprolol (ZEBETA) 10 MG tablet TAKE 1 TABLET(10 MG) BY MOUTH TWICE DAILY 60 tablet 7  . doxazosin (CARDURA) 1 MG tablet Take 1 mg by mouth daily.    . furosemide (LASIX) 20 MG tablet TAKE 2 TABLETS BY MOUTH TWICE DAILY (Patient taking differently: TAKE 40 MG BY MOUTH TWICE DAILY) 180 tablet 2  . irbesartan (AVAPRO) 75 MG tablet TAKE 1 TABLET(75 MG) BY MOUTH AT BEDTIME 30 tablet 6  . isosorbide mononitrate (IMDUR) 30 MG 24 hr tablet Take 0.5 tablets (15 mg total) by mouth daily. 45 tablet 3  . nitroGLYCERIN (NITROSTAT) 0.4 MG SL tablet Place 1 tablet (0.4 mg total) under the tongue every 5 (five) minutes as needed for chest pain (max 3 doses). 75 tablet 1  . rosuvastatin (CRESTOR) 40 MG tablet TAKE 1 TABLET(40 MG) BY MOUTH DAILY 30 tablet 11  . traMADol (ULTRAM) 50 MG tablet Take 50 mg by mouth every 6 (six) hours as needed for moderate pain.      No current facility-administered medications for this visit.     Allergies:   Penicillins    Social History:  The patient  reports that  has never smoked. His smokeless tobacco use includes snuff. He reports that he drinks alcohol. He reports that he does not use drugs.   Family History:  The patient's family history includes Heart attack in his father; Heart disease in his father; Throat cancer in his mother.    ROS:  General:no colds or fevers, no weight changes Skin:no rashes or ulcers HEENT:no blurred vision, no congestion CV:see HPI PUL:see HPI GI:no diarrhea constipation or melena, no indigestion GU:no hematuria, no dysuria MS:+ joint pain, no claudication Neuro:no syncope, no  lightheadedness Endo:borderline diabetes, no thyroid disease  Wt Readings from Last 3 Encounters:  09/10/17 294 lb 9.6 oz (133.6 kg)  08/25/17 290 lb (131.5 kg)  08/14/17 292 lb 12.8 oz (132.8 kg)     PHYSICAL EXAM: VS:  BP (!) 148/70   Pulse 66   Ht 6' (1.829 m)   Wt 294 lb 9.6 oz (133.6 kg)   BMI 39.95 kg/m  , BMI Body mass index is 39.95 kg/m. General:Pleasant affect, NAD Neuro:alert and oriented, MAE, follows commands, + facial symmetry    EKG:  EKG is NOT ordered today.  Recent Labs: 08/14/2017: BUN 14; Creatinine, Ser 0.91; Hemoglobin 14.3; Platelets 133; Potassium 4.6; Sodium 138    Lipid Panel No results found for: CHOL, TRIG, HDL, CHOLHDL, VLDL, LDLCALC, LDLDIRECT     Other studies Reviewed: Additional studies/ records that were reviewed today include: . Cardiac cath 08/25/17  Conclusion    Severe native vessel coronary disease with multiple 80-95% stenoses throughout the mid and distal RCA.  The vessels apparently recanalized since the last catheterization in 2006.  Totally occluded proximal LAD.  Eccentric 90% proximal circumflex stenosis.  No obtuse marginal branches arise from the circumflex.  Totally occluded ramus intermedius.  30% mid left main narrowing.  Total occlusion of saphenous vein graft to the PDA.  Diffuse 50-60% stenosis of the distal one half of the saphenous vein graft to the ramus intermedius.  Eccentric 95% stenosis in the sequential SV graft to the first and second diagonal.  Widely patent left internal mammary artery to the LAD.  Decreased LV systolic function with EF 35%, significant elevation in LVEDP, 26 mmHg.  RECOMMENDATIONS:   Severe native and bypass graft coronary disease as noted above.  No interventional options exist in the native circulation.  The only interventional option in the bypass graft territory is the relatively focal stenosis in the sequential graft to the diagonals.  The graft is calcified.  The  graft is so would have a higher risk of atheroembolic complications.  Uptitrate diuretic therapy.  Consider high risk PCI on the sequential graft to diagonal #1 and #2 versus continued medical therapy.  Catheterization procedure was very complicated due to the patient's obesity, calcified aorta, and difficulty with catheter engagement from both the left radial and femoral.    Coronary Diagrams   Diagnostic Diagram        Study Conclusions  - Left ventricle: The cavity size was normal. Wall thickness was   increased in a pattern of moderate LVH. Systolic function was   normal. The estimated ejection fraction was in the range of 55%   to 60%. Wall motion was normal; there were no regional wall   motion abnormalities. Features are consistent with a pseudonormal   left ventricular filling pattern, with concomitant abnormal   relaxation and increased filling pressure (grade 2 diastolic   dysfunction). Doppler parameters are consistent with   indeterminate ventricular filling pressure. - Aortic valve: Valve mobility was restricted. There was mild   stenosis. There was no regurgitation. - Aorta: Ascending aorta diameter: 43.55 mm (ED). - Ascending aorta: The ascending aorta was mildly dilated. - Mitral valve: Transvalvular velocity was within the normal range.   There was no evidence for stenosis. There was no regurgitation. - Left atrium: The atrium was severely dilated. - Right ventricle: The cavity size was normal. Wall thickness was   normal. Systolic function was normal.   ASSESSMENT AND PLAN:  1.  Angina and DOE improved with imdur, will increase imdur to 30 mg.  Cardiac cath with graft occlusion.  Reviewed all with pt and his wife.   We discussed diet and exercise, will refer back to cardiac rehab as well.  Will ask Dr. Marlou Porch to sign.  Will have him follow up with Dr. Marlou Porch in 1-2 months.  2.  Cardiomyopathy by cath.  EF 35%.  Increase of irbesartan to 150 mg daily.   Will repeat echo in 2-3 months.  Will increase lasix to 80 mg in AM and 40 in pm for 1 week then back to 40 mg BID.  Combination systolic and diastolic HF    3.  CAD with hx CABG  see cath report.  And #1.    4.   HLD controlled. Last LDL 68 continue crestor  5.  DM-2 borderline -- per pcp,   5.  Obesity discussed going to [employer] or DASH diet.  Will refer to Cardiac Rehab     Current medicines are reviewed with the patient today.  The patient Has no concerns regarding medicines.  The following changes have been made:  See above Labs/ tests ordered today include:see above  Disposition:   FU:  see above  Signed, Cecilie Kicks, NP  09/10/2017 8:37 AM    [address], [address] [place], [place] Phone: [phone]; Fax: [phone]

## 2017-09-10 ENCOUNTER — Encounter: Payer: Self-pay | Admitting: Cardiology

## 2017-09-10 ENCOUNTER — Ambulatory Visit (INDEPENDENT_AMBULATORY_CARE_PROVIDER_SITE_OTHER): Payer: Managed Care, Other (non HMO) | Admitting: Cardiology

## 2017-09-10 VITALS — BP 148/70 | HR 66 | Ht 72.0 in | Wt 294.6 lb

## 2017-09-10 DIAGNOSIS — E7800 Pure hypercholesterolemia, unspecified: Secondary | ICD-10-CM

## 2017-09-10 DIAGNOSIS — E118 Type 2 diabetes mellitus with unspecified complications: Secondary | ICD-10-CM

## 2017-09-10 DIAGNOSIS — I2510 Atherosclerotic heart disease of native coronary artery without angina pectoris: Secondary | ICD-10-CM

## 2017-09-10 DIAGNOSIS — R0609 Other forms of dyspnea: Secondary | ICD-10-CM | POA: Diagnosis not present

## 2017-09-10 DIAGNOSIS — Z951 Presence of aortocoronary bypass graft: Secondary | ICD-10-CM

## 2017-09-10 MED ORDER — FUROSEMIDE 20 MG PO TABS: ORAL_TABLET | ORAL | 3 refills | Status: DC

## 2017-09-10 MED ORDER — ISOSORBIDE MONONITRATE ER 30 MG PO TB24: 30.0000 mg | ORAL_TABLET | Freq: Every day | ORAL | 3 refills | Status: DC

## 2017-09-10 MED ORDER — IRBESARTAN 150 MG PO TABS: 150.0000 mg | ORAL_TABLET | Freq: Every day | ORAL | 3 refills | Status: DC

## 2017-09-10 NOTE — Patient Instructions (Addendum)
Medication Instructions:  Your physician has recommended you make the following change in your medication:  1.  INCREASE the Lasix to 80 mg in the a.m. And 40 in the p.m. For 1 week then go back to 2 tablets twice a day 2.  INCREASE the Irbesartan to 150 mg daily 3.  INCREASE the Imdur to 30 mg daily   Labwork: None ordered  Testing/Procedures: None ordered  Follow-Up: Your physician recommends that you schedule a follow-up appointment in: 10/05/17 TO SEE DR. Marlou Porch.  ARRIVE AT 2:25  Any Other Special Instructions Will Be Listed Below (If Applicable).     If you need a refill on your cardiac medications before your next appointment, please call your pharmacy.   The Cath  The EF is 35 % on cath and had been 55% in April on Echo,  Plan to adjust meds to improve.  Recheck on echo in 2-3 months.  The Mammary artery to LAD, is open which is great.  Severe native disease and total occlusion of of VG to PDA,  Diffuse 50-60% stenosis of the distal one half of the saphenous vein graft to the ramus intermedius, Eccentric 95% stenosis in the sequential SV graft to the first and second diagonal,Decreased LV systolic function with EF 35%, significant elevation in LVEDP, 26 mmHg

## 2017-09-11 ENCOUNTER — Telehealth: Payer: Self-pay | Admitting: Cardiology

## 2017-09-11 ENCOUNTER — Other Ambulatory Visit: Payer: Self-pay | Admitting: Cardiology

## 2017-09-11 DIAGNOSIS — R0602 Shortness of breath: Secondary | ICD-10-CM

## 2017-09-11 DIAGNOSIS — I5043 Acute on chronic combined systolic (congestive) and diastolic (congestive) heart failure: Secondary | ICD-10-CM

## 2017-09-11 NOTE — Telephone Encounter (Signed)
Called to see if pt was dizzy with med changes and he was.  He will hold off on higher dose of Avapro until we have diuresed then go to 150 mg daily.  Will check BMP next week, Monday or Tuesday.

## 2017-09-21 ENCOUNTER — Other Ambulatory Visit: Payer: Managed Care, Other (non HMO) | Admitting: *Deleted

## 2017-09-21 ENCOUNTER — Other Ambulatory Visit: Payer: Self-pay | Admitting: Cardiology

## 2017-09-21 ENCOUNTER — Encounter (INDEPENDENT_AMBULATORY_CARE_PROVIDER_SITE_OTHER): Payer: Self-pay

## 2017-09-21 LAB — BASIC METABOLIC PANEL
BUN/Creatinine Ratio: 17 (ref 10–24)
BUN: 15 mg/dL (ref 8–27)
CALCIUM: 9.2 mg/dL (ref 8.6–10.2)
CO2: 25 mmol/L (ref 20–29)
CREATININE: 0.89 mg/dL (ref 0.76–1.27)
Chloride: 97 mmol/L (ref 96–106)
GFR calc non Af Amer: 86 mL/min/{1.73_m2} (ref >59)
GFR, EST AFRICAN AMERICAN: 99 mL/min/{1.73_m2} (ref >59)
Glucose: 193 mg/dL — ABNORMAL HIGH (ref 65–99)
Potassium: 4.3 mmol/L (ref 3.5–5.2)
Sodium: 138 mmol/L (ref 134–144)

## 2017-09-22 ENCOUNTER — Other Ambulatory Visit: Payer: Self-pay | Admitting: *Deleted

## 2017-09-22 DIAGNOSIS — I209 Angina pectoris, unspecified: Secondary | ICD-10-CM

## 2017-09-22 DIAGNOSIS — R0602 Shortness of breath: Secondary | ICD-10-CM

## 2017-09-30 ENCOUNTER — Ambulatory Visit: Payer: Managed Care, Other (non HMO) | Admitting: Vascular Surgery

## 2017-09-30 ENCOUNTER — Ambulatory Visit (HOSPITAL_COMMUNITY)
Admission: RE | Admit: 2017-09-30 | Discharge: 2017-09-30 | Disposition: A | Discharge status: Home / Self Care | Payer: Managed Care, Other (non HMO) | Source: Ambulatory Visit | Attending: Vascular Surgery | Admitting: Vascular Surgery

## 2017-09-30 ENCOUNTER — Encounter: Payer: Self-pay | Admitting: Vascular Surgery

## 2017-09-30 VITALS — BP 133/66 | HR 65 | Temp 97.6°F | Resp 18 | Ht 72.0 in | Wt 293.0 lb

## 2017-09-30 DIAGNOSIS — I6523 Occlusion and stenosis of bilateral carotid arteries: Secondary | ICD-10-CM

## 2017-09-30 DIAGNOSIS — M25569 Pain in unspecified knee: Secondary | ICD-10-CM | POA: Insufficient documentation

## 2017-09-30 DIAGNOSIS — M25561 Pain in right knee: Secondary | ICD-10-CM

## 2017-09-30 DIAGNOSIS — I739 Peripheral vascular disease, unspecified: Secondary | ICD-10-CM

## 2017-09-30 NOTE — Progress Notes (Signed)
Requested by:  Jonathon Jordan, MD [address] [place], [place] [id number]  Reason for consultation: right leg pain   History of Present Illness   Paul Bryant is a 72 y.o. (1946-03-31) male who presents with chief complaint: right leg pain.  Onset of symptom occurred year ago.  Pain is described as aching and stiffness in R leg, severity 1-3/10, and associated with prolong stasis.  Patient notes pain improves with movement.  Patient has attempted to treat this pain with movement.  The patient has no rest pain symptoms also and no leg wounds/ulcers.  Pt notes arthritic sx in B leg joints.  He has seen Ortho in the past for joint injections.  Atherosclerotic risk factors include: HTN, HLD.  Past Medical History:  Diagnosis Date  . CAD (coronary artery disease)   . Carotid artery occlusion   . COPD (chronic obstructive pulmonary disease) (HCC)    mild  . Diverticulitis oct. 2011  . Dizziness   . Gout   . Heart attack ([place])   . Hernia    umbilical and ventral  . Hyperlipidemia   . Hypertension   . IBS (irritable bowel syndrome)   . Neuropathy   . Obesity, morbid ([place])   . OSA on CPAP   . RBBB (right bundle branch block with left anterior fascicular block)   . S/P cardiac catheterization 04/10/2005   ,  2019 with occluded grafts, LIMA is patent  . Sleep apnea   . Urolithiasis     Past Surgical History:  Procedure Laterality Date  . lap sigmoid colectomy due to persistent    . LEFT HEART CATH AND CORS/GRAFTS ANGIOGRAPHY N/A 08/25/2017   Procedure: LEFT HEART CATH AND CORS/GRAFTS ANGIOGRAPHY;  Surgeon: Belva Crome, MD;  Location: [facility];  Service: Cardiovascular;  Laterality: N/A;  . open heart bypass  aug. 1998  . quadruple bypass    . UMBILICAL HERNIA REPAIR     right proctoscopy  . URETERAL STENT PLACEMENT  09-18-2010   Dr Diona Fanti    Social History   Socioeconomic History  . Marital status: Married    Spouse name: Joron Velis   . Number of children: 2  . Years of education: Not on file  . Highest education level: Not on file  Social Needs  . Financial resource strain: Not on file  . Food insecurity - worry: Not on file  . Food insecurity - inability: Not on file  . Transportation needs - medical: Not on file  . Transportation needs - non-medical: Not on file  Occupational History  . Occupation: city Heritage manager: [employer]  Tobacco Use  . Smoking status: Never Smoker  . Smokeless tobacco: Current User    Types: Snuff  Substance and Sexual Activity  . Alcohol use: Yes    Comment: socially 2-3 cans of beer  . Drug use: No  . Sexual activity: Not on file  Other Topics Concern  . Not on file  Social History Narrative   Pt was adopted; Father was a doctor.    Family History  Problem Relation Age of Onset  . Throat cancer Mother   . Heart disease Father   . Heart attack Father   . Colon cancer Neg Hx   . Liver disease Neg Hx     Current Outpatient Medications  Medication Sig Dispense Refill  . aspirin 81 MG tablet Take 81 mg by mouth daily.      [name]  bisoprolol (ZEBETA) 10 MG tablet TAKE 1 TABLET(10 MG) BY MOUTH TWICE DAILY 60 tablet 7  . doxazosin (CARDURA) 1 MG tablet Take 1 mg by mouth daily.    . furosemide (LASIX) 20 MG tablet TAKE 4 TABLETS BY MOUTH IN THE A.M, TAKE 2 TABLETS BY MOUTH IN THE P.M. FOR 1 WEEK THEN GO BACK TO 2 TABLETS BY MOUTH TWICE A DAY 180 tablet 3  . irbesartan (AVAPRO) 150 MG tablet Take 1 tablet (150 mg total) by mouth daily. 90 tablet 3  . isosorbide mononitrate (IMDUR) 30 MG 24 hr tablet Take 1 tablet (30 mg total) by mouth daily. 90 tablet 3  . nitroGLYCERIN (NITROSTAT) 0.4 MG SL tablet Place 1 tablet (0.4 mg total) under the tongue every 5 (five) minutes as needed for chest pain (max 3 doses). 75 tablet 1  . rosuvastatin (CRESTOR) 40 MG tablet TAKE 1 TABLET(40 MG) BY MOUTH DAILY 30 tablet 11  . traMADol (ULTRAM) 50 MG tablet Take 50 mg by mouth every 6  (six) hours as needed for moderate pain.      No current facility-administered medications for this visit.     Allergies  Allergen Reactions  . Penicillins Hives, Rash and Other (See Comments)    Has patient had a PCN reaction causing immediate rash, facial/tongue/throat swelling, SOB or lightheadedness with hypotension: Unknown Has patient had a PCN reaction causing severe rash involving mucus membranes or skin necrosis: No Has patient had a PCN reaction that required hospitalization: No Has patient had a PCN reaction occurring within the last 10 years: No If all of the above answers are "NO", then may proceed with Cephalosporin use.     REVIEW OF SYSTEMS (negative unless checked):   Cardiac:  []  Chest pain or chest pressure? [x]  Shortness of breath upon activity? []  Shortness of breath when lying flat? []  Irregular heart rhythm?  Vascular:  []  Pain in calf, thigh, or hip brought on by walking? []  Pain in feet at night that wakes you up from your sleep? []  Blood clot in your veins? [x]  Leg swelling?  Pulmonary:  []  Oxygen at home? []  Productive cough? []  Wheezing?  Neurologic:  []  Sudden weakness in arms or legs? []  Sudden numbness in arms or legs? []  Sudden onset of difficult speaking or slurred speech? []  Temporary loss of vision in one eye? []  Problems with dizziness?  Gastrointestinal:  []  Blood in stool? []  Vomited blood?  Genitourinary:  []  Burning when urinating? []  Blood in urine?  Psychiatric:  []  Major depression  Hematologic:  []  Bleeding problems? []  Problems with blood clotting?  Dermatologic:  []  Rashes or ulcers?  Constitutional:  []  Fever or chills?  Ear/Nose/Throat:  []  Change in hearing? []  Nose bleeds? []  Sore throat?  Musculoskeletal:  []  Back pain? []  Joint pain? []  Muscle pain?   For VQI Use Only   PRE-ADM LIVING Home  AMB STATUS Ambulatory  CAD Sx None  PRIOR CHF None  STRESS TEST No   Physical Examination      Vitals:   09/30/17 0913 09/30/17 0916  BP: (!) 141/67 133/66  Pulse: 65   Resp: 18   Temp: 97.6 F (36.4 C)   TempSrc: Oral   SpO2: 96%   Weight: 293 lb (132.9 kg)   Height: 6' (1.829 m)    Body mass index is 39.74 kg/m.  General: Alert, O x 3, Obese,NAD  Head: [place]/AT,   Ear/Nose/Throat: Hearing grossly intact, nares without erythema or drainage, oropharynx without  Erythema or Exudate , Mallampati score: 3, Dentition intact  Eyes: PERRLA, EOMI,   Neck: Supple, mid-line trachea,    Pulmonary: Sym exp, good B air movt,CTA B  Cardiac: RRR, Nl S1, S2, Murmur present: aortic listening area, No rubs, No S3,S4  Vascular: Vessel Right Left  Radial Palpable Palpable  Brachial Palpable Palpable  Carotid Palpable, No Bruit, transmitted murmur Palpable, No Bruit, transmitted murmur  Aorta Not palpable N/A  Femoral Palpable Palpable  Popliteal Not palpable Not palpable  PT Faintly palpable Faintly palpable  DP Faintly palpable Faintly palpable   Gastrointestinal: soft, non-distended, non-tender to palpation, No guarding or rebound, no HSM, no masses, no CVAT B, No palpable prominent aortic pulse,  large pannus  Musculoskeletal: M/S 5/5 throughout , Extremities without ischemic changes , Minimal edema in B legs today, mild LDS B, B chronic venous stasis changes  Neurologic: CN 2-12 intact , Pain and light touch intact in extremities , Motor exam as listed above  Psychiatric: Judgement intact, Mood & affect appropriate for pt's clinical situation  Dermatologic: See M/S exam for extremity exam, No rashes otherwise noted  Lymph : Palpable lymph nodes: None    Non-Invasive Vascular imaging   ABI Findings: Right Rt Pressure (mmHg) Index Waveform Comment   Brachial 130     PTA 110 0.81 biphasic    DP 137 1.01 triphasic    Great Toe 65 0.48     Left Lt Pressure (mmHg) Index Waveform Comment  Brachial 135     PTA 163 1.21 triphasic    DP 159 1.18 triphasic     Great Toe 102 0.76     ABI/TBI Today's ABI Today's TBI Previous ABI Previous TBI  Right 1.01  0.48  NA  NA   Left 1.21  0.76  NA  NA    Final Interpretation: Right: Resting right ankle-brachial index is within normal range. No evidence of significant right lower extremity arterial disease. The right toe-brachial index is abnormal.  Left: Resting left ankle-brachial index is within normal range. No evidence of significant left lower extremity arterial disease. The left toe-brachial index is normal.     Outside Studies/Documentation   10 pages of outside documents were reviewed including: outpatient PCP chart.    Medical Decision Making   MIKAH POSS is a 72 y.o. 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  His BLE ABI demonstrate no PAD in LLE and minimal PAD in RLE  Based on this patient's history and physical exam, I recommend: B carotid duplex in in 6 months as previous scheduled.  There is some suggestion of sub-clinical disease in the L ICA on the prior duplex.  I discussed in depth with the patient the nature of atherosclerosis, and emphasized the importance of maximal medical management including strict control of blood pressure, blood glucose, and lipid levels, antiplatelet agent, obtaining regular exercise, and cessation of smoking.    The patient is aware that without maximal medical management the underlying atherosclerotic disease process will progress, limiting the benefit of any interventions. The patient is currently on a statin: Crestor.  The patient is currently on an anti-platelet: ASA.  Thank you for allowing [place] to participate in this patient's care.   [name], MD, FACS [facility] Office: [phone] Pager:  [phone]  09/30/2017, 8:52 AM

## 2017-10-05 ENCOUNTER — Encounter: Payer: Self-pay | Admitting: Family Medicine

## 2017-10-05 ENCOUNTER — Encounter: Payer: Self-pay | Admitting: Cardiology

## 2017-10-05 ENCOUNTER — Ambulatory Visit (INDEPENDENT_AMBULATORY_CARE_PROVIDER_SITE_OTHER): Payer: Managed Care, Other (non HMO) | Admitting: Cardiology

## 2017-10-05 VITALS — BP 132/72 | HR 69 | Ht 72.0 in | Wt 291.4 lb

## 2017-10-05 DIAGNOSIS — I2510 Atherosclerotic heart disease of native coronary artery without angina pectoris: Secondary | ICD-10-CM

## 2017-10-05 DIAGNOSIS — I420 Dilated cardiomyopathy: Secondary | ICD-10-CM | POA: Diagnosis not present

## 2017-10-05 DIAGNOSIS — I77810 Thoracic aortic ectasia: Secondary | ICD-10-CM

## 2017-10-05 DIAGNOSIS — I6523 Occlusion and stenosis of bilateral carotid arteries: Secondary | ICD-10-CM | POA: Diagnosis not present

## 2017-10-05 DIAGNOSIS — E7800 Pure hypercholesterolemia, unspecified: Secondary | ICD-10-CM

## 2017-10-05 DIAGNOSIS — Z951 Presence of aortocoronary bypass graft: Secondary | ICD-10-CM

## 2017-10-05 MED ORDER — SACUBITRIL-VALSARTAN 49-51 MG PO TABS: 1.0000 | ORAL_TABLET | Freq: Two times a day (BID) | ORAL | 11 refills | Status: DC

## 2017-10-05 NOTE — Patient Instructions (Addendum)
Medication Instructions:  Please stop your Avapro and start Entresto 49-51 mg twice a day. Continue all other medications as listed.  Testing/Procedures: You have been referred to cardiac rehab.  Follow-Up: Follow up in 1 month with Cecilie Kicks, NP.  If you need a refill on your cardiac medications before your next appointment, please call your pharmacy.  Thank you for choosing [facility]!!

## 2017-10-05 NOTE — Progress Notes (Signed)
Paul [place]. [address], [address] [place], [place]  [address] Phone: [phone] Fax:  [phone]  Date:  10/05/2017   ID:  Paul Bryant, DOB 10-Mar-1946, MRN [id number]  PCP:  Jonathon Jordan, MD   History of Present Illness: Paul Bryant is a 72 y.o. male with hx of coronary artery disease status post myocardial infarction at age 80 originally treated by Dr. Melvern Banker first treated with POBA then eventually CABG at 31 with hypertension, hyperlipidemia, obesity. He underwent a nuclear stress test on 06/17/10 which showed an ejection fraction of 46%, inferior wall infarction of moderate sized, very small degree of peri-infarct ischemia in this distribution.   His shortness of breath at baseline had increased and resulted in cardiac catheterization on 08/25/17:  Severe native vessel coronary disease with multiple 80-95% stenoses throughout the mid and distal RCA.  The vessels apparently recanalized since the last catheterization in 2006.  Totally occluded proximal LAD.  Eccentric 90% proximal circumflex stenosis.  No obtuse marginal branches arise from the circumflex.  Totally occluded ramus intermedius.  30% mid left main narrowing.  Total occlusion of saphenous vein graft to the PDA.  Diffuse 50-60% stenosis of the distal one half of the saphenous vein graft to the ramus intermedius.  Eccentric 95% stenosis in the sequential SV graft to the first and second diagonal.  Widely patent left internal mammary artery to the LAD.  Decreased LV systolic function with EF 35%, significant elevation in LVEDP, 26 mmHg.  RECOMMENDATIONS:   Severe native and bypass graft coronary disease as noted above.  No interventional options exist in the native circulation.  The only interventional option in the bypass graft territory is the relatively focal stenosis in the sequential graft to the diagonals.  The graft is calcified.  The graft is so would have a higher risk of atheroembolic  complications.  Uptitrate diuretic therapy.  Consider high risk PCI on the sequential graft to diagonal #1 and #2 versus continued medical therapy. Catheterization procedure was very complicated due to the patient's obesity, calcified aorta, and difficulty with catheter engagement from both the left radial and femoral.  Weight loss has been an issue since I have known him.  This is contributing to his orthopedic issues as well. Orthopedic issues seem to be his main complaint. Back and knee.  DVT was negative on 04/26/14.   Sees Dr. [name]. Also a nutritionist in the past.  Frustrated about weight.  Has shortness of breath with activity at baseline. Enjoys grandchildren. Could not hike [place] previously because of shortness of breath.  He has mild bilateral carotid artery plaque noted. He has seen [name] with vascular surgery. Doppler was performed on 08/22/16.  He has also been evaluated for lower extremity vascular issues and note reviewed.  10/05/17-total once again I stressed the importance of really trying to tackle weight loss, calorie restriction.  They reiterated to me that they were eating well, salad with meat for dinner for instance.  Occasionally he will snack on peanuts.  They have seen a nutritionist in the past.  He has lost 15 pounds but then gains it back.  There are many limitations.  At one point, his wife began to cry.  I wishes for him are to try to control his secondary prevention efforts as best as possible.  There were good questions such as if his cholesterol is so good and why does he have all this plaque for instance.  We discussed  that there are multiple reasons to have atherosclerosis.  I expressed my concern for him with regards to cardiovascular risk and mortality.    Wt Readings from Last 3 Encounters:  10/05/17 291 lb 6.4 oz (132.2 kg)  09/30/17 293 lb (132.9 kg)  09/10/17 294 lb 9.6 oz (133.6 kg)     Past Medical History:  Diagnosis Date  . CAD  (coronary artery disease)   . Carotid artery occlusion   . COPD (chronic obstructive pulmonary disease) (HCC)    mild  . Diverticulitis oct. 2011  . Dizziness   . Gout   . Heart attack ([place])   . Hernia    umbilical and ventral  . Hyperlipidemia   . Hypertension   . IBS (irritable bowel syndrome)   . Neuropathy   . Obesity, morbid ([place])   . OSA on CPAP   . RBBB (right bundle branch block with left anterior fascicular block)   . S/P cardiac catheterization 04/10/2005   ,  2019 with occluded grafts, LIMA is patent  . Sleep apnea   . Urolithiasis     Past Surgical History:  Procedure Laterality Date  . lap sigmoid colectomy due to persistent    . LEFT HEART CATH AND CORS/GRAFTS ANGIOGRAPHY N/A 08/25/2017   Procedure: LEFT HEART CATH AND CORS/GRAFTS ANGIOGRAPHY;  Surgeon: Belva Crome, MD;  Location: [facility];  Service: Cardiovascular;  Laterality: N/A;  . open heart bypass  aug. 1998  . quadruple bypass    . UMBILICAL HERNIA REPAIR     right proctoscopy  . URETERAL STENT PLACEMENT  09-18-2010   Dr Diona Fanti    Current Outpatient Medications  Medication Sig Dispense Refill  . aspirin 81 MG tablet Take 81 mg by mouth daily.      . bisoprolol (ZEBETA) 10 MG tablet TAKE 1 TABLET(10 MG) BY MOUTH TWICE DAILY 60 tablet 7  . doxazosin (CARDURA) 1 MG tablet Take 1 mg by mouth daily.    . furosemide (LASIX) 20 MG tablet TAKE 4 TABLETS BY MOUTH IN THE A.M, TAKE 2 TABLETS BY MOUTH IN THE P.M. FOR 1 WEEK THEN GO BACK TO 2 TABLETS BY MOUTH TWICE A DAY 180 tablet 3  . isosorbide mononitrate (IMDUR) 30 MG 24 hr tablet Take 1 tablet (30 mg total) by mouth daily. 90 tablet 3  . nitroGLYCERIN (NITROSTAT) 0.4 MG SL tablet Place 1 tablet (0.4 mg total) under the tongue every 5 (five) minutes as needed for chest pain (max 3 doses). 75 tablet 1  . rosuvastatin (CRESTOR) 40 MG tablet TAKE 1 TABLET(40 MG) BY MOUTH DAILY 30 tablet 11  . traMADol (ULTRAM) 50 MG tablet Take 50 mg by mouth  every 6 (six) hours as needed for moderate pain.     . sacubitril-valsartan (ENTRESTO) 49-51 MG Take 1 tablet by mouth 2 (two) times daily. 60 tablet 11   No current facility-administered medications for this visit.     Allergies:    Allergies  Allergen Reactions  . Penicillins Hives, Rash and Other (See Comments)    Has patient had a PCN reaction causing immediate rash, facial/tongue/throat swelling, SOB or lightheadedness with hypotension: Unknown Has patient had a PCN reaction causing severe rash involving mucus membranes or skin necrosis: No Has patient had a PCN reaction that required hospitalization: No Has patient had a PCN reaction occurring within the last 10 years: No If all of the above answers are "NO", then may proceed with Cephalosporin use.  Social History:  The patient  reports that  has never smoked. His smokeless tobacco use includes snuff. He reports that he drinks alcohol. He reports that he does not use drugs.   ROS:  Please see the history of present illness.  Back pain, leg pain, chronic lower extremity swelling.  PHYSICAL EXAM: VS:  BP 132/72   Pulse 69   Ht 6' (1.829 m)   Wt 291 lb 6.4 oz (132.2 kg)   SpO2 96%   BMI 39.52 kg/m   GEN: Well nourished, well developed, in no acute distress , obese HEENT: normal  Neck: no JVD, carotid bruits, or masses Cardiac: RRR; 3/6 SM, no rubs, or gallops,1+ BLE edema  Respiratory:  clear to auscultation bilaterally, normal work of breathing GI: soft, nontender, nondistended, + BS MS: no deformity or atrophy  Skin: warm and dry, no rash Neuro:  Alert and Oriented x 3, Strength and sensation are intact Psych: euthymic mood, full affect   EKG:  12/02/16-sinus rhythm first-degree AV block, right bundle branch block, LVH, inferior infarct pattern. PR interval 218 ms personally viewed-prior 10/29/15 showed sinus rhythm with first-degree AV block, PR interval 212 ms with right bundle branch block, old inferior infarct  pattern. Personally viewed-prior 09/15/13 Sinus rhythm, right bundle branch block, 67     ECHO: 10/21/13 - Left ventricle:  of 50% to 55%. - Aortic valve: Cusp separation was moderately reduced. There was mild to moderate stenosis. Mild regurgitation. Mean gradient: 18 mm Hg (S). Valve area: 1.72cm^2(VTI). Valve area: 1.52cm^2 (Vmax). - Aorta: Dilated aortic root measuring 44 mm. - Compared to the prior study in 2012 there is no significant change.  11/13/15  - Left ventricle: 55%  to 60%. - Aortic valve: There was moderate stenosis. There was mild   regurgitation. - Aorta: 4.8 cm measurement not very accurate poor image quality   suggest CTA/MRI to accurately measure. - Left atrium: The atrium was moderately dilated. - Atrial septum: No defect or patent foramen ovale was identified.  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 The aorta at the level of the sinuses of Valsalva measures 4.4- 4.6 cm. The proximal ascending thoracic aorta measures 4.4-4.5 cm.  CATH: EF 35%  ASSESSMENT AND PLAN:  CAD/post CABG/dilated cardiomyopathy/NYHA class III  - Shortness of breath, deconditioning, morbid obesity, very limited.  Cardiac catheterization reviewed performed by Dr. Tamala Julian in early 2019.  Continue to stress medical management.  We will go ahead and place him on Entresto medium range dose and stop Avapro.  Hopefully cost will not be a limitation for him.  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 Further deterioration of conduction system may warrant pacemaker in the future.  No high risk symptoms such as syncope.  Morbid obesity  - Continue to encourage weight loss. This is troublesome given his back pain. He would be at increased risk for any orthopedic surgery. Encouraged him 15 more plant-based diet, fruits, vegetables. Drastically reduced caloric intake. He has seen nutritionist in the past.   Continue to work on this.  I have given him referral to cardiac rehab and to [facility].  Hyperlipidemia  - Continue with Crestor 40. Goal-directed therapy.  Dilated aortic root  - CT scan showing 4.5 cm.  -Continue to follow with echocardiogram.  Continue optimize blood pressure.  Aortic stenosis  - Previously moderate   Multiple orthopedic issues including back pain, spinal stenosis, knee pain  - Continues to  be main complaint. Unfortunately this is making his deconditioning worse, shortness of breath worse.  Challenging situation  COPD  - Dr. [name], could not afford inhaler  1 month f/u with Cecilie Kicks, NP.   Signed, Candee Furbish, MD [facility]  10/05/2017 12:19 PM

## 2017-10-14 ENCOUNTER — Telehealth: Payer: Self-pay | Admitting: *Deleted

## 2017-10-14 DIAGNOSIS — I208 Other forms of angina pectoris: Secondary | ICD-10-CM

## 2017-10-14 NOTE — Telephone Encounter (Signed)
From: Jerline Pain, MD Sent: 10/14/2017   3:12 PM To: Shellia Cleverly, RN  Dx. Stable angina.  Thanks  Jerline Pain, MD   ----- Message ----- From: Rowe Pavy, RN Sent: 10/10/2017   8:17 AM To: Jerline Pain, MD, Wyatt Portela  Thank you for the referral for phase II.   With his recent cath and opting to treat bblockage medically, stable angina would be a better option and this would qualify him for insurance purposes for cardiac rehab.    Would it be be possible to create a new referral with diagnosis of "stable angina"?   Angina Pectoris is contraindicated for cardiac rehab and unfortunately SOB and obesity although certainly risk factors for CAD- insurance would not reimburse for phase II - Unless you were thinking Maintenance (self pay non telemetry)??   Thanks again for the referral and we look forward to helping this patient reduce his risk factors  [place] [employer], BSN Cardiac and Pulmonary Rehab Nurse Navigator

## 2017-10-15 ENCOUNTER — Telehealth (HOSPITAL_COMMUNITY): Payer: Self-pay

## 2017-10-15 NOTE — Telephone Encounter (Signed)
Patients insurance is active and benefits verified through [place] - $60.00 co-pay, no deductible, out of pocket amount of $4,500/$3,471.25 has been met, no co-insurance, and no pre-authorization is required. Passport/reference [id number]  Patients insurance is active and benefits verified through Medicare A/B - No co-pay, deductible amount of $185.00/$185.00 has been met, no out of pocket, and no pre-authorization is required. Passport/reference [phone]

## 2017-10-19 ENCOUNTER — Telehealth (HOSPITAL_COMMUNITY): Payer: Self-pay

## 2017-10-19 NOTE — Telephone Encounter (Signed)
Attempted to call patient in regard to Cardiac Rehab - lm on vm

## 2017-10-27 ENCOUNTER — Telehealth (HOSPITAL_COMMUNITY): Payer: Self-pay

## 2017-10-27 NOTE — Telephone Encounter (Signed)
Called and spoke with wife of patient in regards to Cardiac Rehab and wife stated patient has received new insurance with [place]. They are currently out for lunch right now and will call me back once they get back home to get patient signed up.

## 2017-10-27 NOTE — Telephone Encounter (Signed)
Called and scheduled patient for CR - Orientation on 12/22/2017 at 7:30am. Patient will attend the 6:45am exc class. Went over insurance with patient and patient verbally stated he understands. Mailed packet.

## 2017-10-31 NOTE — Progress Notes (Signed)
Cardiology Office Note   Date:  11/05/2017   ID:  Paul Bryant, DOB 07-Jul-1946, MRN [id number]  PCP:  Jonathon Jordan, MD  Cardiologist:  Dr. Marlou Porch    Chief Complaint  Patient presents with  . Cardiomyopathy      History of Present Illness: Paul Bryant is a 72 y.o. male who presents for medication adjustment.   He has ahx of coronary artery disease status post myocardial infarction at age 42 originally treated by Dr. Melvern Banker first treated with POBA then eventually CABG at [place]. LIMA to LAD, VG seq to diagonals one and two, VG to OM and VG to PDA by Dr. Roxy Manns. Last cath was 03/2005 by Dr. Melvern Banker EF 45% all grafts patent. At that time patent renal arteries as well. He also has hypertension, hyperlipidemia, obesity. He underwent a nuclear stress test on 06/17/10 which showed an ejection fraction of 46%, inferior wall infarction of moderate sized, very small degree of peri-infarct ischemia in this distribution. He has mild bilateral carotid artery plaque noted. He has seen [name] with vascular surgery. Doppler was performed on 08/22/16.Also has lower ext dopplers ordered.    With continued DOE we proceeded with cardiac cath.  Severe native disease and total occlusion of of VG to PDA,  Diffuse 50-60% stenosis of the distal one half of the saphenous vein graft to the ramus intermedius, Eccentric 95% stenosis in the sequential SV graft to the first and second diagonal, Widely patent left internal mammary artery to the LAD.  Decreased LV systolic function with EF 35%, significant elevation in LVEDP, 26 mmHg.   The only interventional option in the bypass graft territory is the relatively focal stenosis in the sequential graft to the diagonals. The graft is calcified. The graft is so would have a higher risk of atheroembolic complications,  Uptitrate diuretic therapy.  Consider high risk PCI on the sequential graft to diagonal #1 and #2 versus continued medical  therapy.  Catheterization procedure was very complicated due to the patient's obesity, calcified aorta, and difficulty with catheter engagement from both the left radial and femoral.  He saw Dr. Marlou Porch on 10/05/17 entresto added, and avapro stopped.  Will titrate up.  With 1st degree AV block difficult to titrate BB.  Discussed wt loss. He was referred to cardiac rehab.    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    Today he is fatigued, he did do a lot yesterday and he has retired. He has been having headaches after taking imdur , this is new but is at higher dose now at 30 mg.  He is tolerating the entresto but it will $400 per month and they do not believe they can afford.  He has enough for another month so will continue for now but will not increase as he is so fatigued - if in truth will be $400, they will recheck then will switch back avapro.  His wt is down 4 lbs he is working on his diet.  At home his wt is 282 lbs.  No chest pain and no change in his SOB.      Past Medical History:  Diagnosis Date  . CAD (coronary artery disease)   . Carotid artery occlusion   . COPD (chronic obstructive pulmonary disease) (HCC)    mild  . Diverticulitis oct. 2011  . Dizziness   . Gout   . Heart attack ([place])   . Hernia    umbilical and  ventral  . Hyperlipidemia   . Hypertension   . IBS (irritable bowel syndrome)   . Neuropathy   . Obesity, morbid ([place])   . OSA on CPAP   . RBBB (right bundle branch block with left anterior fascicular block)   . S/P cardiac catheterization 04/10/2005   ,  2019 with occluded grafts, LIMA is patent  . Sleep apnea   . Urolithiasis     Past Surgical History:  Procedure Laterality Date  . lap sigmoid colectomy due to persistent    . LEFT HEART CATH AND CORS/GRAFTS ANGIOGRAPHY N/A 08/25/2017   Procedure: LEFT HEART CATH AND CORS/GRAFTS ANGIOGRAPHY;  Surgeon: Belva Crome, MD;  Location: [facility];  Service: Cardiovascular;  Laterality:  N/A;  . open heart bypass  aug. 1998  . quadruple bypass    . UMBILICAL HERNIA REPAIR     right proctoscopy  . URETERAL STENT PLACEMENT  09-18-2010   Dr Diona Fanti     Current Outpatient Medications  Medication Sig Dispense Refill  . aspirin 81 MG tablet Take 81 mg by mouth daily.      . bisoprolol (ZEBETA) 10 MG tablet TAKE 1 TABLET(10 MG) BY MOUTH TWICE DAILY 60 tablet 7  . doxazosin (CARDURA) 1 MG tablet Take 1 mg by mouth daily.    . furosemide (LASIX) 20 MG tablet TAKE 4 TABLETS BY MOUTH IN THE A.M, TAKE 2 TABLETS BY MOUTH IN THE P.M. FOR 1 WEEK THEN GO BACK TO 2 TABLETS BY MOUTH TWICE A DAY 180 tablet 3  . isosorbide mononitrate (IMDUR) 30 MG 24 hr tablet Take 1 tablet (30 mg total) by mouth daily. 90 tablet 3  . nitroGLYCERIN (NITROSTAT) 0.4 MG SL tablet Place 1 tablet (0.4 mg total) under the tongue every 5 (five) minutes as needed for chest pain (max 3 doses). 75 tablet 1  . rosuvastatin (CRESTOR) 40 MG tablet TAKE 1 TABLET(40 MG) BY MOUTH DAILY 30 tablet 11  . sacubitril-valsartan (ENTRESTO) 49-51 MG Take 1 tablet by mouth 2 (two) times daily. 60 tablet 11  . traMADol (ULTRAM) 50 MG tablet Take 50 mg by mouth every 6 (six) hours as needed for moderate pain.      No current facility-administered medications for this visit.     Allergies:   Penicillins    Social History:  The patient  reports that he has never smoked. His smokeless tobacco use includes snuff. He reports that he drinks alcohol. He reports that he does not use drugs.   Family History:  The patient's family history includes Heart attack in his father; Heart disease in his father; Throat cancer in his mother.    ROS:  General:no colds or fevers, + weight decrease Skin:no rashes or ulcers, + bruises with ASAA which he is taking every day now.  HEENT:no blurred vision, no congestion CV:see HPI PUL:see HPI GI:no diarrhea constipation or melena, no indigestion GU:no hematuria, no dysuria MS:+ bil knee pain-  chronic, no claudication, + low back pain Neuro:no syncope, no lightheadedness Endo:+ diabetes, no thyroid disease  Wt Readings from Last 3 Encounters:  11/05/17 287 lb 12.8 oz (130.5 kg)  10/05/17 291 lb 6.4 oz (132.2 kg)  09/30/17 293 lb (132.9 kg)     PHYSICAL EXAM: VS:  BP 136/64   Pulse 66   Ht 6' (1.829 m)   Wt 287 lb 12.8 oz (130.5 kg)   SpO2 99%   BMI 39.03 kg/m  , BMI Body mass index is 39.03  kg/m. General:Pleasant affect, NAD Skin:Warm and dry, brisk capillary refill HEENT:normocephalic, sclera clear, mucus membranes moist Neck:supple, no JVD, no bruits  Heart:S1S2 RRR with 3/6 murmur, no gallup, rub or click Lungs:clear without rales, rhonchi, or wheezes EUM:PNTIR, soft, non tender, + BS, do not palpate liver spleen or masses Ext:tr lower ext edema Rt > Lt, 2+ pedal pulses, 2+ radial pulses Neuro:alert and oriented X 3, MAE, follows commands, + facial symmetry    EKG:  EKG is NOT ordered today.    Recent Labs: 08/14/2017: Hemoglobin 14.3; Platelets 133 09/21/2017: BUN 15; Creatinine, Ser 0.89; Potassium 4.3; Sodium 138    Lipid Panel No results found for: CHOL, TRIG, HDL, CHOLHDL, VLDL, LDLCALC, LDLDIRECT     Other studies Reviewed: Additional studies/ records that were reviewed today include: . Cardiac cath 08/25/17  Conclusion    Severe native vessel coronary disease with multiple 80-95% stenoses throughout the mid and distal RCA. The vessels apparently recanalized since the last catheterization in 2006.  Totally occluded proximal LAD.  Eccentric 90% proximal circumflex stenosis. No obtuse marginal branches arise from the circumflex.  Totally occluded ramus intermedius.  30% mid left main narrowing.  Total occlusion of saphenous vein graft to the PDA.  Diffuse 50-60% stenosis of the distal one half of the saphenous vein graft to the ramus intermedius.  Eccentric 95% stenosis in the sequential SV graft to the first and second  diagonal.  Widely patent left internal mammary artery to the LAD.  Decreased LV systolic function with EF 35%, significant elevation in LVEDP, 26 mmHg.  RECOMMENDATIONS:   Severe native and bypass graft coronary disease as noted above. No interventional options exist in the native circulation. The only interventional option in the bypass graft territory is the relatively focal stenosis in the sequential graft to the diagonals. The graft is calcified. The graft is so would have a higher risk of atheroembolic complications.  Uptitrate diuretic therapy.  Consider high risk PCI on the sequential graft to diagonal #1 and #2 versus continued medical therapy.  Catheterization procedure was very complicated due to the patient's obesity, calcified aorta, and difficulty with catheter engagement from both the left radial and femoral.    Coronary Diagrams   Diagnostic Diagram        Study Conclusions  - Left ventricle: The cavity size was normal. Wall thickness was increased in a pattern of moderate LVH. Systolic function was normal. The estimated ejection fraction was in the range of 55% to 60%. Wall motion was normal; there were no regional wall motion abnormalities. Features are consistent with a pseudonormal left ventricular filling pattern, with concomitant abnormal relaxation and increased filling pressure (grade 2 diastolic dysfunction). Doppler parameters are consistent with indeterminate ventricular filling pressure. - Aortic valve: Valve mobility was restricted. There was mild stenosis. There was no regurgitation. - Aorta: Ascending aorta diameter: 43.55 mm (ED). - Ascending aorta: The ascending aorta was mildly dilated. - Mitral valve: Transvalvular velocity was within the normal range. There was no evidence for stenosis. There was no regurgitation. - Left atrium: The atrium was severely dilated. - Right ventricle: The cavity size was normal.  Wall thickness was normal. Systolic function was normal.     ASSESSMENT AND PLAN:  1.  Medication eval with recent entresto added and avapro started.  Most likely will not be able to afford entresto so will not increase.  May need to put back on avapro.will check BMP with addition of entresto follow up with me in 2 months,  and Dr. Marlou Porch in 4 months  2.  Headache after imdur will decrease to 15 mg daily if no improvement will stop.  3.  CAD s/p CABG see above with graft dysfunction  4.  cardiomyopathy at cath with EF 35%.  See above.    5.  Morbid obesity down 4 lbs.  Congratulated.  6.  Fatigue will check CBC   7.  Dilated aortic root, CT scan showing 4.5 CM   8.  COPD per Dr. [name].    Current medicines are reviewed with the patient today.  The patient Has no concerns regarding medicines.  The following changes have been made:  See above Labs/ tests ordered today include:see above  Disposition:   FU:  see above  Signed, Cecilie Kicks, NP  11/05/2017 8:15 AM    [address], [place], [place], [place] Phone: [phone]; Fax: [phone]

## 2017-11-05 ENCOUNTER — Ambulatory Visit (INDEPENDENT_AMBULATORY_CARE_PROVIDER_SITE_OTHER): Payer: Medicare Other | Admitting: Cardiology

## 2017-11-05 ENCOUNTER — Encounter: Payer: Self-pay | Admitting: Cardiology

## 2017-11-05 VITALS — BP 136/64 | HR 66 | Ht 72.0 in | Wt 287.8 lb

## 2017-11-05 DIAGNOSIS — R5383 Other fatigue: Secondary | ICD-10-CM | POA: Diagnosis not present

## 2017-11-05 DIAGNOSIS — I77810 Thoracic aortic ectasia: Secondary | ICD-10-CM

## 2017-11-05 DIAGNOSIS — I2510 Atherosclerotic heart disease of native coronary artery without angina pectoris: Secondary | ICD-10-CM | POA: Diagnosis not present

## 2017-11-05 DIAGNOSIS — Z951 Presence of aortocoronary bypass graft: Secondary | ICD-10-CM

## 2017-11-05 DIAGNOSIS — Z79899 Other long term (current) drug therapy: Secondary | ICD-10-CM

## 2017-11-05 DIAGNOSIS — I420 Dilated cardiomyopathy: Secondary | ICD-10-CM

## 2017-11-05 DIAGNOSIS — I208 Other forms of angina pectoris: Secondary | ICD-10-CM

## 2017-11-05 LAB — BASIC METABOLIC PANEL
BUN / CREAT RATIO: 14 (ref 10–24)
BUN: 12 mg/dL (ref 8–27)
CO2: 25 mmol/L (ref 20–29)
CREATININE: 0.85 mg/dL (ref 0.76–1.27)
Calcium: 9.2 mg/dL (ref 8.6–10.2)
Chloride: 97 mmol/L (ref 96–106)
GFR calc non Af Amer: 88 mL/min/{1.73_m2} (ref >59)
GFR, EST AFRICAN AMERICAN: 101 mL/min/{1.73_m2} (ref >59)
GLUCOSE: 205 mg/dL — AB (ref 65–99)
POTASSIUM: 5 mmol/L (ref 3.5–5.2)
SODIUM: 135 mmol/L (ref 134–144)

## 2017-11-05 LAB — CBC
Hematocrit: 42.3 % (ref 37.5–51.0)
Hemoglobin: 14.8 g/dL (ref 13.0–17.7)
MCH: 32.2 pg (ref 26.6–33.0)
MCHC: 35 g/dL (ref 31.5–35.7)
MCV: 92 fL (ref 79–97)
PLATELETS: 147 10*3/uL — AB (ref 150–379)
RBC: 4.59 x10E6/uL (ref 4.14–5.80)
RDW: 13.7 % (ref 12.3–15.4)
WBC: 8.2 10*3/uL (ref 3.4–10.8)

## 2017-11-05 MED ORDER — ISOSORBIDE MONONITRATE ER 30 MG PO TB24: 15.0000 mg | ORAL_TABLET | Freq: Every day | ORAL | 3 refills | Status: DC

## 2017-11-05 NOTE — Patient Instructions (Addendum)
Medication Instructions:  Your physician has recommended you make the following change in your medication:  1.  DECREASE your Imdur to 1/2 tablet daily = 15 mg    Labwork: TODAY:  BMET & CBC  Testing/Procedures: None ordered  Follow-Up: Your physician recommends that you schedule a follow-up appointment in: 2 [place], NP Your physician recommends that you schedule a follow-up appointment in: 4 MONTHS WITH DR. Marlou Porch    Any Other Special Instructions Will Be Listed Below (If Applicable).     If you need a refill on your cardiac medications before your next appointment, please call your pharmacy.

## 2017-11-06 ENCOUNTER — Other Ambulatory Visit: Payer: Medicare Other

## 2017-11-06 ENCOUNTER — Telehealth: Payer: Self-pay | Admitting: *Deleted

## 2017-11-06 DIAGNOSIS — E875 Hyperkalemia: Secondary | ICD-10-CM | POA: Diagnosis not present

## 2017-11-06 DIAGNOSIS — Z79899 Other long term (current) drug therapy: Secondary | ICD-10-CM

## 2017-11-06 DIAGNOSIS — I350 Nonrheumatic aortic (valve) stenosis: Secondary | ICD-10-CM

## 2017-11-06 DIAGNOSIS — I2510 Atherosclerotic heart disease of native coronary artery without angina pectoris: Secondary | ICD-10-CM

## 2017-11-06 LAB — BASIC METABOLIC PANEL
BUN/Creatinine Ratio: 13 (ref 10–24)
BUN: 11 mg/dL (ref 8–27)
CALCIUM: 9 mg/dL (ref 8.6–10.2)
CHLORIDE: 100 mmol/L (ref 96–106)
CO2: 25 mmol/L (ref 20–29)
Creatinine, Ser: 0.87 mg/dL (ref 0.76–1.27)
GFR calc Af Amer: 100 mL/min/{1.73_m2} (ref >59)
GFR calc non Af Amer: 87 mL/min/{1.73_m2} (ref >59)
Glucose: 180 mg/dL — ABNORMAL HIGH (ref 65–99)
POTASSIUM: 4.5 mmol/L (ref 3.5–5.2)
Sodium: 139 mmol/L (ref 134–144)

## 2017-11-06 NOTE — Telephone Encounter (Signed)
Paul Bryant, [name]       11/06/17 8:22 AM  Note    ----- Message from Isaiah Serge, NP sent at 11/06/2017  8:04 AM EDT ----- Please have him check BMP on Monday or today if easier, his K+ was a little elevated and just want to make sure it is back down.  Thanks.  ----- Message ----- From: Jerline Pain, MD Sent: 11/05/2017   6:09 PM To: Isaiah Serge, NP  Recheck K. Thanks Candee Furbish, MD         Another order for BMET placed for this pt, as ordered by Cecilie Kicks NP.  This was previously incorrectly placed and lab is calling for another order to be placed.  Order is in, and lab is currently collecting the pts BMET now.

## 2017-11-06 NOTE — Progress Notes (Signed)
Pt has been made aware of normal result and verbalized understanding.  jw 11/06/17

## 2017-11-06 NOTE — Telephone Encounter (Signed)
-----   Message from Isaiah Serge, NP sent at 11/06/2017  8:04 AM EDT ----- Please have him check BMP on Monday or today if easier, his K+ was a little elevated and just want to make sure it is back down.  Thanks.  ----- Message ----- From: Jerline Pain, MD Sent: 11/05/2017   6:09 PM To: Isaiah Serge, NP  Recheck K. Thanks Candee Furbish, MD

## 2017-11-18 ENCOUNTER — Other Ambulatory Visit: Payer: Self-pay | Admitting: Cardiology

## 2017-11-23 DIAGNOSIS — M1712 Unilateral primary osteoarthritis, left knee: Secondary | ICD-10-CM | POA: Diagnosis not present

## 2017-11-23 DIAGNOSIS — M1711 Unilateral primary osteoarthritis, right knee: Secondary | ICD-10-CM | POA: Diagnosis not present

## 2017-11-23 DIAGNOSIS — J209 Acute bronchitis, unspecified: Secondary | ICD-10-CM | POA: Diagnosis not present

## 2017-11-23 DIAGNOSIS — M25562 Pain in left knee: Secondary | ICD-10-CM | POA: Diagnosis not present

## 2017-11-23 DIAGNOSIS — M48061 Spinal stenosis, lumbar region without neurogenic claudication: Secondary | ICD-10-CM | POA: Diagnosis not present

## 2017-12-08 ENCOUNTER — Other Ambulatory Visit: Payer: Self-pay | Admitting: Cardiology

## 2017-12-10 DIAGNOSIS — M5136 Other intervertebral disc degeneration, lumbar region: Secondary | ICD-10-CM | POA: Diagnosis not present

## 2017-12-16 ENCOUNTER — Telehealth (HOSPITAL_COMMUNITY): Payer: Self-pay | Admitting: *Deleted

## 2017-12-16 NOTE — Telephone Encounter (Signed)
Pt called in preparation for his upcoming appt on 5/28 for orientation to cardiac rehab. Pt would like to reschedule for September. Pt has two trips planned for the summer and would like to wait until September to start cardiac rehab.  Pt mentioned having orthopedic issues with his back and knees.  Pt wonders about the the silver sneaker program.  Asked if pt would prefer to exercise on his own.  Pt would like to do CR. Pt unable to give date for the rescheduled appt.  Pt to call CR with a date. Cherre Huger, BSN Cardiac and Training and development officer

## 2017-12-22 ENCOUNTER — Ambulatory Visit (HOSPITAL_COMMUNITY): Payer: Managed Care, Other (non HMO)

## 2017-12-28 ENCOUNTER — Ambulatory Visit (HOSPITAL_COMMUNITY): Payer: Self-pay

## 2017-12-28 ENCOUNTER — Ambulatory Visit (HOSPITAL_COMMUNITY): Payer: Managed Care, Other (non HMO)

## 2017-12-30 ENCOUNTER — Ambulatory Visit (HOSPITAL_COMMUNITY): Payer: Managed Care, Other (non HMO)

## 2017-12-30 ENCOUNTER — Ambulatory Visit (HOSPITAL_COMMUNITY): Payer: Self-pay

## 2017-12-31 DIAGNOSIS — R05 Cough: Secondary | ICD-10-CM | POA: Diagnosis not present

## 2017-12-31 DIAGNOSIS — R600 Localized edema: Secondary | ICD-10-CM | POA: Diagnosis not present

## 2017-12-31 DIAGNOSIS — I509 Heart failure, unspecified: Secondary | ICD-10-CM | POA: Diagnosis not present

## 2017-12-31 DIAGNOSIS — J449 Chronic obstructive pulmonary disease, unspecified: Secondary | ICD-10-CM | POA: Diagnosis not present

## 2018-01-01 ENCOUNTER — Ambulatory Visit (HOSPITAL_COMMUNITY): Payer: Self-pay

## 2018-01-01 ENCOUNTER — Ambulatory Visit (HOSPITAL_COMMUNITY): Payer: Managed Care, Other (non HMO)

## 2018-01-04 ENCOUNTER — Ambulatory Visit (HOSPITAL_COMMUNITY): Payer: Managed Care, Other (non HMO)

## 2018-01-04 ENCOUNTER — Ambulatory Visit (HOSPITAL_COMMUNITY): Payer: Self-pay

## 2018-01-06 ENCOUNTER — Ambulatory Visit (HOSPITAL_COMMUNITY): Payer: Self-pay

## 2018-01-06 ENCOUNTER — Ambulatory Visit (HOSPITAL_COMMUNITY): Payer: Managed Care, Other (non HMO)

## 2018-01-08 ENCOUNTER — Ambulatory Visit (HOSPITAL_COMMUNITY): Payer: Managed Care, Other (non HMO)

## 2018-01-08 ENCOUNTER — Ambulatory Visit (HOSPITAL_COMMUNITY): Payer: Self-pay

## 2018-01-09 ENCOUNTER — Other Ambulatory Visit: Payer: Self-pay | Admitting: Cardiology

## 2018-01-10 NOTE — Progress Notes (Signed)
Cardiology Office Note   Date:  01/11/2018   ID:  MACY LINGENFELTER, DOB January 19, 1946, MRN [id number]  PCP:  Jonathon Jordan, MD  Cardiologist:  Dr. Marlou Porch    Chief Complaint  Patient presents with  . Coronary Artery Disease      History of Present Illness: ANTAEUS Bryant is a 72 y.o. male who presents for BP and edema follow up  He has ahx of coronary artery disease status post myocardial infarction at age 4 originally treated by Dr. Melvern Banker first treated with POBA then eventually CABG at [facility]. LIMA to LAD, VG seq to diagonals one and two, VG to OM and VG to PDA by Dr. Roxy Manns. Last cath was 03/2005 by Dr. Melvern Banker EF 45% all grafts patent. At that time patent renal arteries as well. He also has hypertension, hyperlipidemia, obesity. He underwent a nuclear stress test on 06/17/10 which showed an ejection fraction of 46%, inferior wall infarction of moderate sized, very small degree of peri-infarct ischemia in this distribution. He has mild bilateral carotid artery plaque noted. He has seen [name] with vascular surgery. Doppler was performed on 08/22/16.Also has lower ext dopplers ordered.    With continued DOE we proceeded with cardiac cath. Severe native disease and total occlusion of of VG to PDA, Diffuse 50-60% stenosis of the distal one half of the saphenous vein graft to the ramus intermedius,Eccentric 95% stenosis in the sequential SV graft to the first and second diagonal,Widely patent left internal mammary artery to the LAD. Decreased LV systolic function with EF 35%, significant elevation in LVEDP, 26 mmHg.   The only interventional option in the bypass graft territory is the relatively focal stenosis in the sequential graft to the diagonals. The graft is calcified. The graft is so would have a higher risk of atheroembolic complications,Uptitrate diuretic therapy.  Consider high risk PCI on the sequential graft to diagonal #1 and #2 versus continued  medical therapy.  Catheterization procedure was very complicated due to the patient's obesity, calcified aorta, and difficulty with catheter engagement from both the left radial and femoral.  He saw Dr. Marlou Porch on 10/05/17 entresto added, and avapro stopped.  Will titrate up.  With 1st degree AV block difficult to titrate BB.  Discussed wt loss. He was referred to cardiac rehab.    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     He has been having headaches after taking imdur , this is new but is at higher dose now at 30 mg so we decreased to 15 mg per day and he is tolerating this.  [name]  He was tolerating the entresto but it will $400 per month and they do not believe they can afford.  He has enough for another month so will continue for now but will not increase as he is so fatigued - if in truth will be $400, they will recheck then will switch back avapro.    Today he tells me he is back on Avapro due to cost, I did sign a form for entresto through company.  He has had some more swelling.  No chest pain.  Is holding off on cardiac rehab until Sept so he will not miss any days.  Still has back and knee issues.  Wt is down a few more pounds.  Still somewhat SOB and he has new CPAP but does not feel as helpful.     His wife is with him today.  Past Medical History:  Diagnosis Date  . CAD (coronary artery disease)   . Carotid artery occlusion   . COPD (chronic obstructive pulmonary disease) (HCC)    mild  . Diverticulitis oct. 2011  . Dizziness   . Gout   . Heart attack ([place])   . Hernia    umbilical and ventral  . Hyperlipidemia   . Hypertension   . IBS (irritable bowel syndrome)   . Neuropathy   . Obesity, morbid ([place])   . OSA on CPAP   . RBBB (right bundle branch block with left anterior fascicular block)   . S/P cardiac catheterization 04/10/2005   ,  2019 with occluded grafts, LIMA is patent  . Sleep apnea   . Urolithiasis     Past Surgical History:  Procedure  Laterality Date  . lap sigmoid colectomy due to persistent    . LEFT HEART CATH AND CORS/GRAFTS ANGIOGRAPHY N/A 08/25/2017   Procedure: LEFT HEART CATH AND CORS/GRAFTS ANGIOGRAPHY;  Surgeon: Belva Crome, MD;  Location: [facility];  Service: Cardiovascular;  Laterality: N/A;  . open heart bypass  aug. 1998  . quadruple bypass    . UMBILICAL HERNIA REPAIR     right proctoscopy  . URETERAL STENT PLACEMENT  09-18-2010   Dr Diona Fanti     Current Outpatient Medications  Medication Sig Dispense Refill  . aspirin 81 MG tablet Take 81 mg by mouth daily.      . bisoprolol (ZEBETA) 10 MG tablet TAKE 1 TABLET(10 MG) BY MOUTH TWICE DAILY 60 tablet 7  . doxazosin (CARDURA) 1 MG tablet Take 1 mg by mouth daily.    . furosemide (LASIX) 20 MG tablet Take 2 tablets (40 mg total) by mouth 2 (two) times daily. 180 tablet 3  . irbesartan (AVAPRO) 150 MG tablet Take 150 mg by mouth daily. Take 0ne 150 mg tablet by mouth daily.    . isosorbide mononitrate (IMDUR) 30 MG 24 hr tablet Take 0.5 tablets (15 mg total) by mouth daily. 90 tablet 3  . nitroGLYCERIN (NITROSTAT) 0.4 MG SL tablet Place 1 tablet (0.4 mg total) under the tongue every 5 (five) minutes as needed for chest pain (max 3 doses). 75 tablet 1  . rosuvastatin (CRESTOR) 40 MG tablet TAKE 1 TABLET(40 MG) BY MOUTH DAILY 30 tablet 11  . sacubitril-valsartan (ENTRESTO) 49-51 MG Take 1 tablet by mouth 2 (two) times daily. 60 tablet 11  . traMADol (ULTRAM) 50 MG tablet Take 50 mg by mouth every 6 (six) hours as needed for moderate pain.      No current facility-administered medications for this visit.     Allergies:   Penicillins    Social History:  The patient  reports that he has never smoked. His smokeless tobacco use includes snuff. He reports that he drinks alcohol. He reports that he does not use drugs.   Family History:  The patient's family history includes Heart attack in his father; Heart disease in his father; Throat cancer in his  mother.    ROS:  General:no colds or fevers, some weight loss Skin:no rashes or ulcers HEENT:no blurred vision, no congestion CV:see HPI PUL:see HPI GI:no diarrhea constipation or melena, no indigestion GU:no hematuria, no dysuria MS:+ knee pain, no claudication Neuro:no syncope, no lightheadedness Endo:no diabetes, no thyroid disease  Wt Readings from Last 3 Encounters:  01/11/18 284 lb (128.8 kg)  11/05/17 287 lb 12.8 oz (130.5 kg)  10/05/17 291 lb 6.4 oz (132.2 kg)  PHYSICAL EXAM: VS:  BP 124/70   Pulse 67   Ht 6' (1.829 m)   Wt 284 lb (128.8 kg)   SpO2 92%   BMI 38.52 kg/m  , BMI Body mass index is 38.52 kg/m. General:Pleasant affect, NAD Skin:Warm and dry, brisk capillary refill HEENT:normocephalic, sclera clear, mucus membranes moist Neck:supple, no JVD, no bruits  Heart:S1S2 RRR without murmur, gallup, rub or click Lungs:with rhonchi but no rales, or wheezes OYD:XAJO, non tender, + BS, do not palpate liver spleen or masses Ext:no to tr lower ext edema, 2+ pedal pulses, 2+ radial pulses Neuro:alert and oriented X 3, MAE, follows commands, + facial symmetry    EKG:  EKG is NOT ordered today.   Recent Labs: 11/05/2017: Hemoglobin 14.8; Platelets 147 11/06/2017: BUN 11; Creatinine, Ser 0.87; Potassium 4.5; Sodium 139    Lipid Panel No results found for: CHOL, TRIG, HDL, CHOLHDL, VLDL, LDLCALC, LDLDIRECT     Other studies Reviewed: Additional studies/ records that were reviewed today include: .  Cardiac cath 08/25/17 Conclusion    Severe native vessel coronary disease with multiple 80-95% stenoses throughout the mid and distal RCA. The vessels apparently recanalized since the last catheterization in 2006.  Totally occluded proximal LAD.  Eccentric 90% proximal circumflex stenosis. No obtuse marginal branches arise from the circumflex.  Totally occluded ramus intermedius.  30% mid left main narrowing.  Total occlusion of saphenous vein graft  to the PDA.  Diffuse 50-60% stenosis of the distal one half of the saphenous vein graft to the ramus intermedius.  Eccentric 95% stenosis in the sequential SV graft to the first and second diagonal.  Widely patent left internal mammary artery to the LAD.  Decreased LV systolic function with EF 35%, significant elevation in LVEDP, 26 mmHg.  RECOMMENDATIONS:   Severe native and bypass graft coronary disease as noted above. No interventional options exist in the native circulation. The only interventional option in the bypass graft territory is the relatively focal stenosis in the sequential graft to the diagonals. The graft is calcified. The graft is so would have a higher risk of atheroembolic complications.  Uptitrate diuretic therapy.  Consider high risk PCI on the sequential graft to diagonal #1 and #2 versus continued medical therapy.  Catheterization procedure was very complicated due to the patient's obesity, calcified aorta, and difficulty with catheter engagement from both the left radial and femoral.    Coronary Diagrams   Diagnostic Diagram        Study Conclusions  - Left ventricle: The cavity size was normal. Wall thickness was increased in a pattern of moderate LVH. Systolic function was normal. The estimated ejection fraction was in the range of 55% to 60%. Wall motion was normal; there were no regional wall motion abnormalities. Features are consistent with a pseudonormal left ventricular filling pattern, with concomitant abnormal relaxation and increased filling pressure (grade 2 diastolic dysfunction). Doppler parameters are consistent with indeterminate ventricular filling pressure. - Aortic valve: Valve mobility was restricted. There was mild stenosis. There was no regurgitation. - Aorta: Ascending aorta diameter: 43.55 mm (ED). - Ascending aorta: The ascending aorta was mildly dilated. - Mitral valve: Transvalvular velocity  was within the normal range. There was no evidence for stenosis. There was no regurgitation. - Left atrium: The atrium was severely dilated. - Right ventricle: The cavity size was normal. Wall thickness was normal. Systolic function was normal.             ASSESSMENT AND PLAN:  1.  CAD with hx of CABG and graft dysfunction.  No chest pain, can only tolerate 15 mg of Imdur  2.   Cardiomyopathy with EF 35% tried entresto but cost is too high. Now back on Avapro.  BP controlled today.  Will check with Dr. Marlou Porch if repeat echo would be helpful.  It has been > 3 months.  3.   Morbid obesity. Wt is slowly coming down.  4.   Lower ext edema back some he can take extra 40 mg lasix if needed if wt up 5 lbs in a week.  5.   Fatigue improved some   6.   COPD per Pulmonary.  7.   Dilated aortic root, CT scan showing 4.5 CM  He will follow up with Dr. Marlou Porch in 2 months.   Current medicines are reviewed with the patient today.  The patient Has no concerns regarding medicines.  The following changes have been made:  See above Labs/ tests ordered today include:see above  Disposition:   FU:  see above  Signed, Cecilie Kicks, NP  01/11/2018 8:02 AM    [facility], [place], [place], [place] Phone: [phone]; Fax: [phone]

## 2018-01-11 ENCOUNTER — Other Ambulatory Visit: Payer: Self-pay

## 2018-01-11 ENCOUNTER — Encounter (INDEPENDENT_AMBULATORY_CARE_PROVIDER_SITE_OTHER): Payer: Self-pay

## 2018-01-11 ENCOUNTER — Ambulatory Visit (HOSPITAL_COMMUNITY): Payer: Managed Care, Other (non HMO)

## 2018-01-11 ENCOUNTER — Ambulatory Visit (HOSPITAL_COMMUNITY): Payer: Self-pay

## 2018-01-11 ENCOUNTER — Encounter: Payer: Self-pay | Admitting: Cardiology

## 2018-01-11 ENCOUNTER — Ambulatory Visit (INDEPENDENT_AMBULATORY_CARE_PROVIDER_SITE_OTHER): Payer: Medicare Other | Admitting: Cardiology

## 2018-01-11 VITALS — BP 124/70 | HR 67 | Ht 72.0 in | Wt 284.0 lb

## 2018-01-11 DIAGNOSIS — I77810 Thoracic aortic ectasia: Secondary | ICD-10-CM

## 2018-01-11 DIAGNOSIS — Z951 Presence of aortocoronary bypass graft: Secondary | ICD-10-CM | POA: Diagnosis not present

## 2018-01-11 DIAGNOSIS — I420 Dilated cardiomyopathy: Secondary | ICD-10-CM

## 2018-01-11 DIAGNOSIS — I2510 Atherosclerotic heart disease of native coronary artery without angina pectoris: Secondary | ICD-10-CM | POA: Diagnosis not present

## 2018-01-11 DIAGNOSIS — I208 Other forms of angina pectoris: Secondary | ICD-10-CM

## 2018-01-11 MED ORDER — BISOPROLOL FUMARATE 10 MG PO TABS: ORAL_TABLET | ORAL | 7 refills | Status: DC

## 2018-01-11 NOTE — Patient Instructions (Addendum)
Medication Instructions:  Your physician has recommended you make the following change in your medication:  1. Take extra lasix (40 mg ) for weight gain of 5 lbs in one day or one week.    Labwork: -None  Testing/Procedures: -None  Follow-Up: Keep follow up with Dr.Skains.   Any Other Special Instructions Will Be Listed Below (If Applicable).     If you need a refill on your cardiac medications before your next appointment, please call your pharmacy.

## 2018-01-11 NOTE — Telephone Encounter (Signed)
Outpatient Medication Detail    Disp Refills Start End   bisoprolol (ZEBETA) 10 MG tablet 60 tablet 7 01/11/2018    Sig: TAKE 1 TABLET(10 MG) BY MOUTH TWICE DAILY   Sent to pharmacy as: bisoprolol (ZEBETA) 10 MG tablet   E-Prescribing Status: Receipt confirmed by pharmacy (01/11/2018 8:42 AM EDT)   Pharmacy   [id number] - [place], [place]

## 2018-01-13 ENCOUNTER — Ambulatory Visit (HOSPITAL_COMMUNITY): Payer: Self-pay

## 2018-01-13 ENCOUNTER — Ambulatory Visit (HOSPITAL_COMMUNITY): Payer: Managed Care, Other (non HMO)

## 2018-01-15 ENCOUNTER — Ambulatory Visit (HOSPITAL_COMMUNITY): Payer: Managed Care, Other (non HMO)

## 2018-01-15 ENCOUNTER — Ambulatory Visit (HOSPITAL_COMMUNITY): Payer: Self-pay

## 2018-01-18 ENCOUNTER — Ambulatory Visit (HOSPITAL_COMMUNITY): Payer: Self-pay

## 2018-01-18 ENCOUNTER — Ambulatory Visit (HOSPITAL_COMMUNITY): Payer: Managed Care, Other (non HMO)

## 2018-01-20 ENCOUNTER — Ambulatory Visit (HOSPITAL_COMMUNITY): Payer: Managed Care, Other (non HMO)

## 2018-01-20 ENCOUNTER — Ambulatory Visit (HOSPITAL_COMMUNITY): Payer: Self-pay

## 2018-01-22 ENCOUNTER — Ambulatory Visit (HOSPITAL_COMMUNITY): Payer: Self-pay

## 2018-01-22 ENCOUNTER — Ambulatory Visit (HOSPITAL_COMMUNITY): Payer: Managed Care, Other (non HMO)

## 2018-01-25 ENCOUNTER — Ambulatory Visit (HOSPITAL_COMMUNITY): Payer: Managed Care, Other (non HMO)

## 2018-01-25 ENCOUNTER — Ambulatory Visit (HOSPITAL_COMMUNITY): Payer: Self-pay

## 2018-01-25 DIAGNOSIS — E1169 Type 2 diabetes mellitus with other specified complication: Secondary | ICD-10-CM | POA: Diagnosis not present

## 2018-01-25 DIAGNOSIS — I10 Essential (primary) hypertension: Secondary | ICD-10-CM | POA: Diagnosis not present

## 2018-01-25 DIAGNOSIS — E785 Hyperlipidemia, unspecified: Secondary | ICD-10-CM | POA: Diagnosis not present

## 2018-01-25 DIAGNOSIS — R0609 Other forms of dyspnea: Secondary | ICD-10-CM | POA: Diagnosis not present

## 2018-01-25 DIAGNOSIS — E119 Type 2 diabetes mellitus without complications: Secondary | ICD-10-CM | POA: Diagnosis not present

## 2018-01-26 ENCOUNTER — Telehealth: Payer: Self-pay | Admitting: Cardiology

## 2018-01-26 ENCOUNTER — Telehealth: Payer: Self-pay

## 2018-01-26 NOTE — Telephone Encounter (Signed)
**Note De-Identified Paul Bryant Obfuscation** Letter received Paul Bryant fax from [employer] pt asst foundation stating: "Based on the information that was provided on the pts application , we have determined that your pt may be eligible for assistance with obtaining medication from an alternate funding source". Please have your pt contact the fund below for further assistance: [employer] at [phone]  I called the pt to make him aware and he advised me that he received the same letter in the mail and that he has already contacted the foundation mentioned in the letter and someone there is helping him.

## 2018-01-26 NOTE — Telephone Encounter (Addendum)
New Message:      Blanch Media is calling from Dr. Cheron Schaumann office and states the pt was seen in the office on yesterday. She states the pt has lost 20 lbs in 6 months and the pt is having persistent fatigue. She also states that the pt labs were faxed to [place] today.  Pt had a repeat EKG and it was the same as the one in January. She states the pt has moderate aortic stenosis and may need a sooner appt.

## 2018-01-27 ENCOUNTER — Ambulatory Visit (HOSPITAL_COMMUNITY): Payer: Managed Care, Other (non HMO)

## 2018-01-27 ENCOUNTER — Ambulatory Visit (HOSPITAL_COMMUNITY): Payer: Self-pay

## 2018-01-27 NOTE — Telephone Encounter (Signed)
I'm fine if you want him to see an APP earlier than my appt. If not available, ok to keep mine.  Candee Furbish, MD

## 2018-01-27 NOTE — Telephone Encounter (Signed)
Left message for pt to c/b to discuss s/s and if he would like to be seen by an APP prior to being seen by Dr Marlou Porch in August.

## 2018-01-27 NOTE — Telephone Encounter (Signed)
Reviewed chart.  Pt last seen 01/11/18 by Cecilie Kicks, NP.  He c/o fatigue chronically.  HX: severe CAD, COPD, obesity and MILD AS on last echo 5/18.  Last cath EF 35%.  Pt is scheduled for f/u with Dr Marlou Porch 03/15/18.  Will forward to him for review to see if he feels pt needs to be seen by APP sooner.

## 2018-01-29 ENCOUNTER — Ambulatory Visit (HOSPITAL_COMMUNITY): Payer: Self-pay

## 2018-01-29 ENCOUNTER — Ambulatory Visit (HOSPITAL_COMMUNITY): Payer: Managed Care, Other (non HMO)

## 2018-02-01 ENCOUNTER — Ambulatory Visit (HOSPITAL_COMMUNITY): Payer: Self-pay

## 2018-02-01 ENCOUNTER — Ambulatory Visit (HOSPITAL_COMMUNITY): Payer: Managed Care, Other (non HMO)

## 2018-02-03 ENCOUNTER — Ambulatory Visit (HOSPITAL_COMMUNITY): Payer: Managed Care, Other (non HMO)

## 2018-02-03 ENCOUNTER — Ambulatory Visit (HOSPITAL_COMMUNITY): Payer: Self-pay

## 2018-02-05 ENCOUNTER — Ambulatory Visit (HOSPITAL_COMMUNITY): Payer: Self-pay

## 2018-02-05 ENCOUNTER — Ambulatory Visit (HOSPITAL_COMMUNITY): Payer: Managed Care, Other (non HMO)

## 2018-02-08 ENCOUNTER — Ambulatory Visit (HOSPITAL_COMMUNITY): Payer: Managed Care, Other (non HMO)

## 2018-02-08 ENCOUNTER — Ambulatory Visit (HOSPITAL_COMMUNITY): Payer: Self-pay

## 2018-02-09 NOTE — Telephone Encounter (Signed)
Spoke with patient who does not wish to move his appt up at this time.  He will c/b prior to his appt should he have any needs or concerns.

## 2018-02-10 ENCOUNTER — Ambulatory Visit (HOSPITAL_COMMUNITY): Payer: Managed Care, Other (non HMO)

## 2018-02-10 ENCOUNTER — Ambulatory Visit (HOSPITAL_COMMUNITY): Payer: Self-pay

## 2018-02-12 ENCOUNTER — Ambulatory Visit (HOSPITAL_COMMUNITY): Payer: Managed Care, Other (non HMO)

## 2018-02-12 ENCOUNTER — Ambulatory Visit (HOSPITAL_COMMUNITY): Payer: Self-pay

## 2018-02-15 ENCOUNTER — Ambulatory Visit (HOSPITAL_COMMUNITY): Payer: Managed Care, Other (non HMO)

## 2018-02-15 ENCOUNTER — Ambulatory Visit (HOSPITAL_COMMUNITY): Payer: Self-pay

## 2018-02-17 ENCOUNTER — Ambulatory Visit (HOSPITAL_COMMUNITY): Payer: Managed Care, Other (non HMO)

## 2018-02-17 ENCOUNTER — Ambulatory Visit (HOSPITAL_COMMUNITY): Payer: Self-pay

## 2018-02-19 ENCOUNTER — Ambulatory Visit (HOSPITAL_COMMUNITY): Payer: Managed Care, Other (non HMO)

## 2018-02-19 ENCOUNTER — Ambulatory Visit (HOSPITAL_COMMUNITY): Payer: Self-pay

## 2018-02-19 ENCOUNTER — Telehealth (HOSPITAL_COMMUNITY): Payer: Self-pay

## 2018-02-19 NOTE — Telephone Encounter (Signed)
Called patient to see if he is ready to reschedule Cardiac rehab - Patient stated he is ready. Scheduled orientation on 04/01/18 at 8:30am. Patient will attend the 8;15am exc class. Patient still has homework packet. Will not send another one.

## 2018-02-22 ENCOUNTER — Ambulatory Visit (HOSPITAL_COMMUNITY): Payer: Managed Care, Other (non HMO)

## 2018-02-22 ENCOUNTER — Ambulatory Visit (HOSPITAL_COMMUNITY): Payer: Self-pay

## 2018-02-24 ENCOUNTER — Ambulatory Visit (HOSPITAL_COMMUNITY): Payer: Self-pay

## 2018-02-24 ENCOUNTER — Ambulatory Visit (HOSPITAL_COMMUNITY): Payer: Managed Care, Other (non HMO)

## 2018-02-26 ENCOUNTER — Ambulatory Visit (HOSPITAL_COMMUNITY): Payer: Self-pay

## 2018-02-26 ENCOUNTER — Ambulatory Visit (HOSPITAL_COMMUNITY): Payer: Managed Care, Other (non HMO)

## 2018-03-01 ENCOUNTER — Ambulatory Visit (HOSPITAL_COMMUNITY): Payer: Managed Care, Other (non HMO)

## 2018-03-01 ENCOUNTER — Ambulatory Visit (HOSPITAL_COMMUNITY): Payer: Self-pay

## 2018-03-03 ENCOUNTER — Ambulatory Visit (HOSPITAL_COMMUNITY): Payer: Managed Care, Other (non HMO)

## 2018-03-03 ENCOUNTER — Ambulatory Visit (HOSPITAL_COMMUNITY): Payer: Self-pay

## 2018-03-05 ENCOUNTER — Ambulatory Visit (HOSPITAL_COMMUNITY): Payer: Self-pay

## 2018-03-05 ENCOUNTER — Ambulatory Visit (HOSPITAL_COMMUNITY): Payer: Managed Care, Other (non HMO)

## 2018-03-08 ENCOUNTER — Ambulatory Visit (HOSPITAL_COMMUNITY): Payer: Self-pay

## 2018-03-08 ENCOUNTER — Ambulatory Visit (HOSPITAL_COMMUNITY): Payer: Managed Care, Other (non HMO)

## 2018-03-10 ENCOUNTER — Ambulatory Visit (HOSPITAL_COMMUNITY): Payer: Managed Care, Other (non HMO)

## 2018-03-10 ENCOUNTER — Ambulatory Visit (HOSPITAL_COMMUNITY): Payer: Self-pay

## 2018-03-12 ENCOUNTER — Ambulatory Visit (HOSPITAL_COMMUNITY): Payer: Self-pay

## 2018-03-12 ENCOUNTER — Ambulatory Visit (HOSPITAL_COMMUNITY): Payer: Managed Care, Other (non HMO)

## 2018-03-12 ENCOUNTER — Ambulatory Visit (INDEPENDENT_AMBULATORY_CARE_PROVIDER_SITE_OTHER): Payer: Medicare Other | Admitting: Internal Medicine

## 2018-03-12 ENCOUNTER — Encounter: Payer: Self-pay | Admitting: Internal Medicine

## 2018-03-12 VITALS — BP 118/60 | HR 72 | Ht 72.0 in | Wt 290.6 lb

## 2018-03-12 DIAGNOSIS — J310 Chronic rhinitis: Secondary | ICD-10-CM | POA: Diagnosis not present

## 2018-03-12 DIAGNOSIS — I208 Other forms of angina pectoris: Secondary | ICD-10-CM | POA: Diagnosis not present

## 2018-03-12 DIAGNOSIS — R05 Cough: Secondary | ICD-10-CM

## 2018-03-12 DIAGNOSIS — R059 Cough, unspecified: Secondary | ICD-10-CM

## 2018-03-12 DIAGNOSIS — G4733 Obstructive sleep apnea (adult) (pediatric): Secondary | ICD-10-CM

## 2018-03-12 DIAGNOSIS — K219 Gastro-esophageal reflux disease without esophagitis: Secondary | ICD-10-CM

## 2018-03-12 DIAGNOSIS — R0602 Shortness of breath: Secondary | ICD-10-CM

## 2018-03-12 MED ORDER — GLYCOPYRROLATE-FORMOTEROL 9-4.8 MCG/ACT IN AERO: 2.0000 | INHALATION_SPRAY | Freq: Two times a day (BID) | RESPIRATORY_TRACT | 12 refills | Status: DC

## 2018-03-12 MED ORDER — GLYCOPYRROLATE-FORMOTEROL 9-4.8 MCG/ACT IN AERO: 2.0000 | INHALATION_SPRAY | Freq: Two times a day (BID) | RESPIRATORY_TRACT | 0 refills | Status: DC

## 2018-03-12 NOTE — Progress Notes (Signed)
HPI Paul Bryant never smoker followed for Dyspnea, OSA, complicagted by morbid obesity, CAD/Hx MI  NPSG 04/27/05 Moderate OSA, AHI 16/ hr, weight 290 lbs PFT-07/24/2011-normal spirometry flows within significant response to bronchodilator. Diffusion mildly reduced. FEV1/FVC 0.77. 6 Minute Walk Test- 96%, 88%, 98% 432 meters. Note exertional desaturation.   --------------------------------------------------------------------------------------------------  820/18-- 72 yo Paul Bryant never smoker followed for Dyspnea, OSA, complicacted by morbid obesity, CAD/Hx MI , aortic stenosis, DM 2 CPAP 10/Advanced> today new machine and auto 5-20 For OSA and SOB. Increase in SOB over the past 6 months. Pt uses [facility] as DME for CPAP.  The act of stepping up to get into his bed at night makes him short of breath. He needs to wait a minute to catch his breath before he puts his CPAP on. Occasional minor dry cough. No benefit from Symbicort. Previous testing has not shown reactive airways disease. He blames "hot weather" for shortness of breath. Cardiology continues to follow his aortic stenosis Complains of watery postnasal drip. CPAP download-100% compliance averaging 6 hours 38 minutes per night with residual AHI 8.1/hour. Machine is old.  03/12/2018- 72 yo Paul Bryant never smoker followed for Dyspnea, OSA, complicacted by morbid obesity, CAD/Hx MI , aortic stenosis, DM 2, HBP,  CPAP auto 5-20/-new machine 2018 -----[facility]. Pt wears CPAP nightly and DL attached. Ne New supplies needed at this time.  Insurance change-no longer covers [employer]. He did not like Anoro dry powder so we will try Bevespi. CPAP pressure starts to low for his comfort.  Download 97% compliance AHI 6.8/hour with pressure range between 7 and 12 CWP, set for 5-20. He describes coughing with drinking liquids.  Sometimes he also gets watery nose with this and a vasomotor pattern.  He refuses all nose sprays.  He agrees to swallowing evaluation for suspected  LPR.  ROS-see HPI   + = positive Constitutional:   No-   weight loss, night sweats, fevers, chills, fatigue, lassitude. HEENT:   No-  headaches, difficulty swallowing, tooth/dental problems, sore throat,       No-  sneezing, itching, ear ache, nasal congestion, +post nasal drip,  CV:  No-   chest pain, orthopnea, PND, swelling in lower extremities, anasarca, dizziness, palpitations Resp: + shortness of breath with exertion, + at rest.              No-   productive cough,  + non-productive cough,  No- coughing up of blood.              No-   change in color of mucus.  No- wheezing.   Skin: No-   rash or lesions. GI:  No-   heartburn, indigestion, abdominal pain, nausea, vomiting,  GU: MS:  +  joint pain or swelling.   Neuro-     nothing unusual Psych:  No- change in mood or affect. No depression or anxiety.  No memory loss.  OBJ- General- Alert, Oriented, Affect-appropriate, Distress- none acute, + morbid obesity Skin- rash-none, lesions- none, excoriation- none. Tanned Lymphadenopathy- none Head- atraumatic            Eyes- Gross vision intact, PERRLA, conjunctivae clear secretions            Ears- Hearing, canals-normal            Nose- Clear, no-Septal dev, mucus, polyps, erosion, perforation             Throat- Mallampati IV , mucosa clear , drainage- none, tonsils- atrophic Neck- flexible , trachea  midline, no stridor , thyroid nl, carotid no bruit Chest - symmetrical excursion , unlabored           Heart/CV- RRR , + 2/6 Systolic murmur , no gallop  , no rub, nl s1 s2                           - JVD- none , edema- 1+, stasis changes- none, varices- none           Lung- clear to P&A, wheeze- none, cough- none , dullness-none, rub- none           Chest wall-  Abd-  Br/ Gen/ Rectal- Not done, not indicated Extrem- cyanosis- none, clubbing, none, atrophy- none, strength- nl Neuro- grossly intact to observation

## 2018-03-12 NOTE — Assessment & Plan Note (Signed)
Suspected from his history. Plan-I instructed chin tuck when swallowing.  Referral for modified barium swallow with speech therapy evaluation.

## 2018-03-12 NOTE — Patient Instructions (Addendum)
Order- DME [facility]- please change auto range to 10-20, continue mask of choice, humidifier, supplies,AirView  Sample and print Rx/ coupon for Bevespi   Inhale 2 puffs, twice daily   Try this instead of Stiolto- see if it helps your breathing  Order- schedule modified barium swallow/ speech therapy for cough with drinking  Please call if we can help

## 2018-03-12 NOTE — Assessment & Plan Note (Signed)
Most dyspnea reflects his morbid obesity and probably his cardiac status.  He seemed to benefit from [employer].  Because of insurance coverage we will let him try changing to [facility].

## 2018-03-12 NOTE — Assessment & Plan Note (Signed)
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  Usually ipratropium nasal spray works well for this but he refuses all nasal sprays.  We will observe for now.

## 2018-03-12 NOTE — Assessment & Plan Note (Signed)
He benefits from CPAP with improved sleep and download confirms satisfactory compliance with adequate control. Plan-DME [facility] will change his pressure range to auto 10-20 because he does not feel comfortable airflow for a while after the machine starts.

## 2018-03-15 ENCOUNTER — Ambulatory Visit (HOSPITAL_COMMUNITY): Payer: Managed Care, Other (non HMO)

## 2018-03-15 ENCOUNTER — Encounter: Payer: Self-pay | Admitting: Cardiology

## 2018-03-15 ENCOUNTER — Ambulatory Visit (INDEPENDENT_AMBULATORY_CARE_PROVIDER_SITE_OTHER): Payer: Medicare Other | Admitting: Cardiology

## 2018-03-15 ENCOUNTER — Ambulatory Visit (HOSPITAL_COMMUNITY): Payer: Self-pay

## 2018-03-15 VITALS — BP 134/64 | HR 60 | Ht 72.0 in | Wt 291.4 lb

## 2018-03-15 DIAGNOSIS — I77810 Thoracic aortic ectasia: Secondary | ICD-10-CM | POA: Diagnosis not present

## 2018-03-15 DIAGNOSIS — I208 Other forms of angina pectoris: Secondary | ICD-10-CM

## 2018-03-15 DIAGNOSIS — I2510 Atherosclerotic heart disease of native coronary artery without angina pectoris: Secondary | ICD-10-CM

## 2018-03-15 DIAGNOSIS — I420 Dilated cardiomyopathy: Secondary | ICD-10-CM | POA: Diagnosis not present

## 2018-03-15 NOTE — Patient Instructions (Signed)
Medication Instructions:  The current medical regimen is effective;  continue present plan and medications.  Testing/Procedures: Your physician has requested that you have an echocardiogram. Echocardiography is a painless test that uses sound waves to create images of your heart. It provides your doctor with information about the size and shape of your heart and how well your heart's chambers and valves are working. This procedure takes approximately one hour. There are no restrictions for this procedure.  Follow-Up: Follow up in 6 months with Cecilie Kicks, NP.  You will receive a letter in the mail 2 months before you are due.  Please call us when you receive this letter to schedule your follow up appointment.  Follow up in 1 year with Dr. Marlou Porch.  You will receive a letter in the mail 2 months before you are due.  Please call us when you receive this letter to schedule your follow up appointment.  If you need a refill on your cardiac medications before your next appointment, please call your pharmacy.  Thank you for choosing [facility]!!

## 2018-03-15 NOTE — Progress Notes (Signed)
Paul Bryant. [address], [address] [place], [place]  [address] Phone: [phone] Fax:  [phone]  Date:  03/15/2018   ID:  Paul Bryant, DOB Dec 02, 1945, MRN [id number]  PCP:  Jonathon Jordan, MD   History of Present Illness: Paul Bryant is a 72 y.o. male with hx of coronary artery disease status post myocardial infarction at age 33 originally treated by Dr. Melvern Banker first treated with POBA then eventually CABG at 46 with hypertension, hyperlipidemia, obesity. He underwent a nuclear stress test on 06/17/10 which showed an ejection fraction of 46%, inferior wall infarction of moderate sized, very small degree of peri-infarct ischemia in this distribution.   His shortness of breath at baseline had increased and resulted in cardiac catheterization on 08/25/17: - med mgt. Would be high risk to try PCI.   Weight loss has been an issue since I have known him.  This is contributing to his orthopedic issues as well. Orthopedic issues seem to be his main complaint. Back and knee.  DVT was negative on 04/26/14.   Sees Dr. [name]. Also a nutritionist in the past.  Frustrated about weight.  Has shortness of breath with activity at baseline. Enjoys grandchildren. Could not hike [place] previously because of shortness of breath.  He has mild bilateral carotid artery plaque noted. He has seen [name] with vascular surgery. Doppler was performed on 08/22/16.  He has also been evaluated for lower extremity vascular issues and note reviewed.  10/05/17-total once again I stressed the importance of really trying to tackle weight loss, calorie restriction.  They reiterated to me that they were eating well, salad with meat for dinner for instance.  Occasionally he will snack on peanuts.  They have seen a nutritionist in the past.  He has lost 15 pounds but then gains it back.  There are many limitations.  At one point, his wife began to cry.  I wishes for him are to try to control his secondary  prevention efforts as best as possible.  There were good questions such as if his cholesterol is so good and why does he have all this plaque for instance.  We discussed that there are multiple reasons to have atherosclerosis.  I expressed my concern for him with regards to cardiovascular risk and mortality.  03/15/18 -we discussed Entresto.  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  We discussed the importance of cardiac rehab.  Main complaints are orthopedic in nature.  Chronic back pain.  Still continues to gain weight.  No syncope bleeding orthopnea.  Recently saw Dr. [name].  Note personally reviewed.  Wt Readings from Last 3 Encounters:  03/15/18 291 lb 6.4 oz (132.2 kg)  03/12/18 290 lb 9.6 oz (131.8 kg)  01/11/18 284 lb (128.8 kg)     Past Medical History:  Diagnosis Date  . CAD (coronary artery disease)   . Carotid artery occlusion   . COPD (chronic obstructive pulmonary disease) (HCC)    mild  . Diverticulitis oct. 2011  . Dizziness   . Gout   . Heart attack ([place])   . Hernia    umbilical and ventral  . Hyperlipidemia   . Hypertension   . IBS (irritable bowel syndrome)   . Neuropathy   . Obesity, morbid ([facility])   . OSA on CPAP   . RBBB (right bundle branch block with left anterior fascicular block)   . S/P cardiac catheterization 04/10/2005   ,  2019 with occluded grafts, LIMA is patent  . Sleep apnea   . Urolithiasis     Past Surgical History:  Procedure Laterality Date  . lap sigmoid colectomy due to persistent    . LEFT HEART CATH AND CORS/GRAFTS ANGIOGRAPHY N/A 08/25/2017   Procedure: LEFT HEART CATH AND CORS/GRAFTS ANGIOGRAPHY;  Surgeon: Belva Crome, MD;  Location: [facility];  Service: Cardiovascular;  Laterality: N/A;  . open heart bypass  aug. 1998  . quadruple bypass    . UMBILICAL HERNIA REPAIR     right proctoscopy  . URETERAL STENT PLACEMENT  09-18-2010   Dr Diona Fanti    Current Outpatient Medications  Medication Sig  Dispense Refill  . aspirin 81 MG tablet Take 81 mg by mouth daily.      . bisoprolol (ZEBETA) 10 MG tablet TAKE 1 TABLET(10 MG) BY MOUTH TWICE DAILY 60 tablet 7  . doxazosin (CARDURA) 1 MG tablet Take 1 mg by mouth daily.    . furosemide (LASIX) 20 MG tablet Take 2 tablets (40 mg total) by mouth 2 (two) times daily. 180 tablet 3  . Glycopyrrolate-Formoterol (BEVESPI AEROSPHERE) 9-4.8 MCG/ACT AERO Inhale 2 puffs into the lungs 2 (two) times daily. 10.7 g 12  . nitroGLYCERIN (NITROSTAT) 0.4 MG SL tablet Place 1 tablet (0.4 mg total) under the tongue every 5 (five) minutes as needed for chest pain (max 3 doses). 75 tablet 1  . rosuvastatin (CRESTOR) 40 MG tablet TAKE 1 TABLET(40 MG) BY MOUTH DAILY 30 tablet 11  . sacubitril-valsartan (ENTRESTO) 49-51 MG Take 1 tablet by mouth 2 (two) times daily. 60 tablet 11  . traMADol (ULTRAM) 50 MG tablet Take 50 mg by mouth every 6 (six) hours as needed for moderate pain.     . isosorbide mononitrate (IMDUR) 30 MG 24 hr tablet Take 0.5 tablets (15 mg total) by mouth daily. 90 tablet 3   No current facility-administered medications for this visit.     Allergies:    Allergies  Allergen Reactions  . Penicillins Hives, Rash and Other (See Comments)    Has patient had a PCN reaction causing immediate rash, facial/tongue/throat swelling, SOB or lightheadedness with hypotension: Unknown Has patient had a PCN reaction causing severe rash involving mucus membranes or skin necrosis: No Has patient had a PCN reaction that required hospitalization: No Has patient had a PCN reaction occurring within the last 10 years: No If all of the above answers are "NO", then may proceed with Cephalosporin use.     Social History:  The patient  reports that he has never smoked. His smokeless tobacco use includes snuff. He reports that he drinks alcohol. He reports that he does not use drugs.   ROS:  Please see the history of present illness.  Unless stated above all other  review of systems negative PHYSICAL EXAM: VS:  BP 134/64   Pulse 60   Ht 6' (1.829 m)   Wt 291 lb 6.4 oz (132.2 kg)   SpO2 93%   BMI 39.52 kg/m   GEN: Well nourished, well developed, in no acute distress  HEENT: normal  Neck: no JVD, carotid bruits, or masses Cardiac: RRR; 3/6 SM, rubs, or gallops,no edema  Respiratory:  clear to auscultation bilaterally, normal work of breathing GI: soft, nontender, nondistended, + BS MS: no deformity or atrophy  Skin: warm and dry, no rash Neuro:  Alert and Oriented x 3, Strength and sensation are intact Psych: euthymic mood, full affect  EKG:  12/02/16-sinus rhythm first-degree AV block, right bundle branch block, LVH, inferior infarct pattern. PR interval 218 ms personally viewed-prior 10/29/15 showed sinus rhythm with first-degree AV block, PR interval 212 ms with right bundle branch block, old inferior infarct pattern. Personally viewed-prior 09/15/13 Sinus rhythm, right bundle branch block, 67     ECHO: 10/21/13 - Left ventricle:  of 50% to 55%. - Aortic valve: Cusp separation was moderately reduced. There was mild to moderate stenosis. Mild regurgitation. Mean gradient: 18 mm Hg (S). Valve area: 1.72cm^2(VTI). Valve area: 1.52cm^2 (Vmax). - Aorta: Dilated aortic root measuring 44 mm. - Compared to the prior study in 2012 there is no significant change.  11/13/15  - Left ventricle: 55%  to 60%. - Aortic valve: There was moderate stenosis. There was mild   regurgitation. - Aorta: 4.8 cm measurement not very accurate poor image quality   suggest CTA/MRI to accurately measure. - Left atrium: The atrium was moderately dilated. - Atrial septum: No defect or patent foramen ovale was identified.  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 The aorta at the level of the sinuses of Valsalva measures 4.4- 4.6 cm. The proximal ascending thoracic aorta measures 4.4-4.5 cm.   Cardiac cath  08/25/17 Conclusion    Severe native vessel coronary disease with multiple 80-95% stenoses throughout the mid and distal RCA. The vessels apparently recanalized since the last catheterization in 2006.  Totally occluded proximal LAD.  Eccentric 90% proximal circumflex stenosis. No obtuse marginal branches arise from the circumflex.  Totally occluded ramus intermedius.  30% mid left main narrowing.  Total occlusion of saphenous vein graft to the PDA.  Diffuse 50-60% stenosis of the distal one half of the saphenous vein graft to the ramus intermedius.  Eccentric 95% stenosis in the sequential SV graft to the first and second diagonal.  Widely patent left internal mammary artery to the LAD.  Decreased LV systolic function with EF 35%, significant elevation in LVEDP, 26 mmHg.  RECOMMENDATIONS:   Severe native and bypass graft coronary disease as noted above. No interventional options exist in the native circulation. The only interventional option in the bypass graft territory is the relatively focal stenosis in the sequential graft to the diagonals. The graft is calcified. The graft is so would have a higher risk of atheroembolic complications.  Uptitrate diuretic therapy.  Consider high risk PCI on the sequential graft to diagonal #1 and #2 versus continued medical therapy.  Catheterization procedure was very complicated due to the patient's obesity, calcified aorta, and difficulty with catheter engagement from both the left radial and femoral.    Coronary Diagrams   Diagnostic Diagram         ASSESSMENT AND PLAN:  CAD/post CABG/dilated cardiomyopathy/NYHA class III  - Shortness of breath, deconditioning, morbid obesity, very limited.  Cardiac catheterization reviewed performed by Dr. Tamala Julian in early 2019.  Continue to stress medical management.  Continue Entresto medium range dose, prior on Avapro.  We were able to help with cost, he calls [employer] each month.   May need to go back on Avapro at some point.  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 Further deterioration of conduction system may warrant pacemaker in the future.  No high risk symptoms such as syncope.  Overall stable.  Morbid obesity  - Continue to encourage weight loss. This is troublesome given his back pain. He would be at increased risk for any orthopedic surgery. Encouraged  him 15 more plant-based diet, fruits, vegetables. Drastically reduced caloric intake. He has seen nutritionist in the past.  Continue to work on this.  I have given him referral to cardiac rehab and to [facility].  He has an appointment to start orientation again with cardiac rehab on 5 September.  Encouraged.  Hyperlipidemia  - Continue with Crestor 40. Goal-directed therapy.  No changes made.  Dilated aortic root  - CT scan showing 4.5 cm.  -Continue to follow with echocardiogram.  We will order again.  Continue optimize blood pressure.  Aortic stenosis  - Previously moderate/mild.  Continue to monitor.  Murmur heard on exam.   Multiple orthopedic issues including back pain, spinal stenosis, knee pain  - Continues to be main complaint. Unfortunately this is making his deconditioning worse, shortness of breath worse.  Challenging situation.  He is going back to orthopedist.  COPD  - Dr. [name], could not afford inhaler.  Continue with obstructive sleep apnea treatment.  Once again at high risk for cardiovascular events in the future.  6 month f/u with Cecilie Kicks, NP. 12 with me.   Signed, Candee Furbish, MD [facility]  03/15/2018 8:46 AM

## 2018-03-16 ENCOUNTER — Ambulatory Visit: Payer: Medicare Other | Admitting: Internal Medicine

## 2018-03-16 ENCOUNTER — Ambulatory Visit: Payer: Managed Care, Other (non HMO) | Admitting: Internal Medicine

## 2018-03-17 ENCOUNTER — Ambulatory Visit (HOSPITAL_COMMUNITY): Payer: Managed Care, Other (non HMO)

## 2018-03-17 ENCOUNTER — Ambulatory Visit (HOSPITAL_COMMUNITY): Payer: Self-pay

## 2018-03-18 ENCOUNTER — Other Ambulatory Visit: Payer: Self-pay

## 2018-03-18 ENCOUNTER — Ambulatory Visit (HOSPITAL_COMMUNITY): Payer: Medicare Other | Attending: Cardiovascular Disease

## 2018-03-18 DIAGNOSIS — I2510 Atherosclerotic heart disease of native coronary artery without angina pectoris: Secondary | ICD-10-CM

## 2018-03-18 DIAGNOSIS — I082 Rheumatic disorders of both aortic and tricuspid valves: Secondary | ICD-10-CM | POA: Diagnosis not present

## 2018-03-18 DIAGNOSIS — I420 Dilated cardiomyopathy: Secondary | ICD-10-CM

## 2018-03-18 DIAGNOSIS — J449 Chronic obstructive pulmonary disease, unspecified: Secondary | ICD-10-CM | POA: Diagnosis not present

## 2018-03-18 DIAGNOSIS — I5030 Unspecified diastolic (congestive) heart failure: Secondary | ICD-10-CM | POA: Insufficient documentation

## 2018-03-18 DIAGNOSIS — I252 Old myocardial infarction: Secondary | ICD-10-CM | POA: Diagnosis not present

## 2018-03-18 DIAGNOSIS — I110 Hypertensive heart disease with heart failure: Secondary | ICD-10-CM | POA: Diagnosis not present

## 2018-03-18 DIAGNOSIS — I77810 Thoracic aortic ectasia: Secondary | ICD-10-CM | POA: Diagnosis not present

## 2018-03-19 ENCOUNTER — Ambulatory Visit (HOSPITAL_COMMUNITY): Payer: Managed Care, Other (non HMO)

## 2018-03-19 ENCOUNTER — Ambulatory Visit (HOSPITAL_COMMUNITY): Payer: Self-pay

## 2018-03-22 ENCOUNTER — Ambulatory Visit (HOSPITAL_COMMUNITY): Payer: Self-pay

## 2018-03-22 ENCOUNTER — Ambulatory Visit (HOSPITAL_COMMUNITY): Payer: Managed Care, Other (non HMO)

## 2018-03-24 ENCOUNTER — Ambulatory Visit (HOSPITAL_COMMUNITY): Payer: Self-pay

## 2018-03-24 ENCOUNTER — Ambulatory Visit (HOSPITAL_COMMUNITY): Payer: Managed Care, Other (non HMO)

## 2018-03-25 ENCOUNTER — Telehealth (HOSPITAL_COMMUNITY): Payer: Self-pay | Admitting: *Deleted

## 2018-03-25 NOTE — Telephone Encounter (Signed)
-----   Message from Jerline Pain, MD sent at 03/24/2018  8:51 PM EDT ----- Regarding: RE: AA A Parameters and Restrictions Try to keep BP < 220 SBP Thanks Candee Furbish, MD  ----- Message ----- From: Rowe Pavy, RN Sent: 03/24/2018   4:27 PM EDT To: Jerline Pain, MD Subject: AA A Parameters and Restrictions               Dr. Marlou Porch,  The above pt is referred to Cardiac Rehab with Stable Angina.  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  The aorta at the level of the sinuses of Valsalva measures 4.4- 4.6 cm. The proximal ascending thoracic aorta measures 4.4-4.5 cm.  Any BP parameters or restrictions of activity, Hand weight limitations we should observe in the rehab setting?  I appreciate your feedback Maurice Small RN, BSN Cardiac and Pulmonary Rehab Nurse Navigator

## 2018-03-26 ENCOUNTER — Telehealth (HOSPITAL_COMMUNITY): Payer: Self-pay | Admitting: Pharmacist

## 2018-03-26 ENCOUNTER — Ambulatory Visit (HOSPITAL_COMMUNITY): Payer: Managed Care, Other (non HMO)

## 2018-03-26 ENCOUNTER — Ambulatory Visit (HOSPITAL_COMMUNITY): Payer: Self-pay

## 2018-03-28 NOTE — Telephone Encounter (Signed)
Spoke with wife, patient wouldn't answer. Patient's wife said patient is overwhelmed by the rehab activities and is in pain and can't do the treadmill / walking circuit, wife would like for him to go but isn't able to convince him to go.

## 2018-03-30 ENCOUNTER — Telehealth (HOSPITAL_COMMUNITY): Payer: Self-pay | Admitting: Pharmacist

## 2018-03-30 DIAGNOSIS — M48061 Spinal stenosis, lumbar region without neurogenic claudication: Secondary | ICD-10-CM | POA: Diagnosis not present

## 2018-03-30 DIAGNOSIS — M5136 Other intervertebral disc degeneration, lumbar region: Secondary | ICD-10-CM | POA: Diagnosis not present

## 2018-03-30 DIAGNOSIS — M25561 Pain in right knee: Secondary | ICD-10-CM | POA: Diagnosis not present

## 2018-03-30 DIAGNOSIS — M25562 Pain in left knee: Secondary | ICD-10-CM | POA: Diagnosis not present

## 2018-03-30 NOTE — Telephone Encounter (Signed)
Cardiac Rehab Medication Review by a Pharmacist  Does the patient  feel that his/her medications are working for him/her?  no  Has the patient been experiencing any side effects to the medications prescribed?  Yes: weird blood pressure readings in the morning and night. Some shortness of breath.  Does the patient measure his/her own blood pressure or blood glucose at home?  Yes, every day monitoring blood pressure; sometimes twice daily for BP. No blood glucose monitoring stated.  Does the patient have any problems obtaining medications due to transportation or finances?   no  Understanding of regimen: fair Understanding of indications: fair Potential of compliance: fair    Pharmacist comments: high blood pressures at night, does take blood pressure medicine twice a daily as usually, low blood pressures in the morning.  Thank you for allowing pharmacy to be a part of this patient's care.  Tamela Gammon, PharmD 03/30/2018 11:02 AM PGY-1 Pharmacy Resident Direct Phone: [phone] Please check [email or web address] for unit-specific pharmacist phone numbers

## 2018-03-31 ENCOUNTER — Telehealth (HOSPITAL_COMMUNITY): Payer: Self-pay | Admitting: *Deleted

## 2018-03-31 ENCOUNTER — Ambulatory Visit (HOSPITAL_COMMUNITY): Payer: Self-pay

## 2018-03-31 ENCOUNTER — Ambulatory Visit (HOSPITAL_COMMUNITY): Payer: Managed Care, Other (non HMO)

## 2018-03-31 NOTE — Telephone Encounter (Signed)
Called patient to confirm appointment for tomorrow.  Pt stated "I don't know if I can walk." He has recently seen his orthopedic doctor and was given "shots in his knee."  He says that his doctor wants him to get more shots before he starts program. Pt unsure about whether he wanted to reschedule orientation appointment initially citing this.

## 2018-04-01 ENCOUNTER — Inpatient Hospital Stay (HOSPITAL_COMMUNITY): Admission: RE | Admit: 2018-04-01 | Payer: Medicare Other | Source: Ambulatory Visit

## 2018-04-05 ENCOUNTER — Ambulatory Visit (HOSPITAL_COMMUNITY): Payer: Medicare Other

## 2018-04-07 ENCOUNTER — Ambulatory Visit (HOSPITAL_COMMUNITY): Payer: Medicare Other

## 2018-04-07 ENCOUNTER — Telehealth: Payer: Self-pay | Admitting: Internal Medicine

## 2018-04-07 NOTE — Telephone Encounter (Signed)
Called patient unable to reach left message to give us a call back.

## 2018-04-08 NOTE — Telephone Encounter (Signed)
Attempted to call pt. I did not receive an answer. I have left a message for pt to return our call.  

## 2018-04-09 ENCOUNTER — Ambulatory Visit (HOSPITAL_COMMUNITY): Payer: Medicare Other

## 2018-04-09 NOTE — Telephone Encounter (Signed)
Attempted to call pt. I did not receive an answer. I have left a message for pt to return our call.  

## 2018-04-10 DIAGNOSIS — Z23 Encounter for immunization: Secondary | ICD-10-CM | POA: Diagnosis not present

## 2018-04-12 ENCOUNTER — Ambulatory Visit (HOSPITAL_COMMUNITY): Payer: Medicare Other

## 2018-04-12 NOTE — Telephone Encounter (Signed)
We have attempted to contact pt several times with no success or call back from pt. Per triage protocol, message will be closed.  

## 2018-04-14 ENCOUNTER — Ambulatory Visit (HOSPITAL_COMMUNITY): Payer: Medicare Other

## 2018-04-16 ENCOUNTER — Ambulatory Visit (HOSPITAL_COMMUNITY): Payer: Medicare Other

## 2018-04-19 ENCOUNTER — Ambulatory Visit (HOSPITAL_COMMUNITY): Payer: Medicare Other

## 2018-04-21 ENCOUNTER — Ambulatory Visit (HOSPITAL_COMMUNITY): Payer: Medicare Other

## 2018-04-23 ENCOUNTER — Ambulatory Visit (HOSPITAL_COMMUNITY): Payer: Medicare Other

## 2018-04-26 ENCOUNTER — Ambulatory Visit (HOSPITAL_COMMUNITY): Payer: Medicare Other

## 2018-04-28 ENCOUNTER — Ambulatory Visit (HOSPITAL_COMMUNITY): Payer: Medicare Other

## 2018-04-30 ENCOUNTER — Ambulatory Visit (HOSPITAL_COMMUNITY): Payer: Medicare Other

## 2018-05-03 ENCOUNTER — Ambulatory Visit (HOSPITAL_COMMUNITY): Payer: Medicare Other

## 2018-05-05 ENCOUNTER — Ambulatory Visit (HOSPITAL_COMMUNITY): Payer: Medicare Other

## 2018-05-07 ENCOUNTER — Ambulatory Visit (HOSPITAL_COMMUNITY): Payer: Medicare Other

## 2018-05-10 ENCOUNTER — Ambulatory Visit (HOSPITAL_COMMUNITY): Payer: Medicare Other

## 2018-05-10 DIAGNOSIS — M170 Bilateral primary osteoarthritis of knee: Secondary | ICD-10-CM | POA: Diagnosis not present

## 2018-05-12 ENCOUNTER — Ambulatory Visit (HOSPITAL_COMMUNITY): Payer: Medicare Other

## 2018-05-14 ENCOUNTER — Ambulatory Visit (HOSPITAL_COMMUNITY): Payer: Medicare Other

## 2018-05-17 ENCOUNTER — Ambulatory Visit (HOSPITAL_COMMUNITY): Payer: Medicare Other

## 2018-05-17 DIAGNOSIS — M1711 Unilateral primary osteoarthritis, right knee: Secondary | ICD-10-CM | POA: Diagnosis not present

## 2018-05-17 DIAGNOSIS — M1712 Unilateral primary osteoarthritis, left knee: Secondary | ICD-10-CM | POA: Diagnosis not present

## 2018-05-19 ENCOUNTER — Ambulatory Visit (HOSPITAL_COMMUNITY): Payer: Medicare Other

## 2018-05-21 ENCOUNTER — Ambulatory Visit (HOSPITAL_COMMUNITY): Payer: Medicare Other

## 2018-05-24 ENCOUNTER — Ambulatory Visit (HOSPITAL_COMMUNITY): Payer: Medicare Other

## 2018-05-24 DIAGNOSIS — M170 Bilateral primary osteoarthritis of knee: Secondary | ICD-10-CM | POA: Diagnosis not present

## 2018-05-25 ENCOUNTER — Other Ambulatory Visit: Payer: Self-pay | Admitting: Cardiology

## 2018-05-25 ENCOUNTER — Other Ambulatory Visit: Payer: Self-pay | Admitting: Internal Medicine

## 2018-05-25 NOTE — Telephone Encounter (Signed)
CY Please note if okay to refill Anoro as patient med list shows Bevespi. Thanks.

## 2018-05-25 NOTE — Telephone Encounter (Signed)
If he is using Anoro, then ok to refill x 12 months and delete Bevespi from med list.

## 2018-05-26 ENCOUNTER — Ambulatory Visit (HOSPITAL_COMMUNITY): Payer: Medicare Other

## 2018-05-27 ENCOUNTER — Telehealth: Payer: Self-pay | Admitting: *Deleted

## 2018-05-27 NOTE — Telephone Encounter (Signed)
Received PA from [facility] for patients ENTRESTO, completed through covermymeds.

## 2018-05-28 ENCOUNTER — Ambulatory Visit (HOSPITAL_COMMUNITY): Payer: Medicare Other

## 2018-05-31 ENCOUNTER — Ambulatory Visit (HOSPITAL_COMMUNITY): Payer: Medicare Other

## 2018-06-02 ENCOUNTER — Ambulatory Visit (HOSPITAL_COMMUNITY): Payer: Medicare Other

## 2018-06-03 ENCOUNTER — Telehealth: Payer: Self-pay | Admitting: Cardiology

## 2018-06-03 NOTE — Telephone Encounter (Signed)
New Message          Sheral Flow is calling from "Cover my Meds  Ref KEY [id number] ENTRUSTO 49/51 is needing a call back concerning this matter. MG [phone].

## 2018-06-04 ENCOUNTER — Ambulatory Visit (HOSPITAL_COMMUNITY): Payer: Medicare Other

## 2018-06-04 NOTE — Telephone Encounter (Signed)
**Note De-Identified  Obfuscation** We received the [facility] PA request form (hard copy)  fax. I have completed the form and placed it in Dr Marlou Porch mail bin awaiting his signature.

## 2018-06-04 NOTE — Telephone Encounter (Signed)
**Note De-Identified Kashten Gowin Obfuscation** I called covermymeds and s/w Almita. Per Almita the pts Entresto PA that was done through covermymeds was incomplete. She states that she is faxing me a hard copy so I can complete the pts Entresto PA and fax it back to his insurance company.

## 2018-06-07 ENCOUNTER — Ambulatory Visit (HOSPITAL_COMMUNITY): Payer: Medicare Other

## 2018-06-09 ENCOUNTER — Ambulatory Visit (HOSPITAL_COMMUNITY): Payer: Medicare Other

## 2018-06-09 NOTE — Telephone Encounter (Signed)
Dr Marlou Porch has signed the pts [facility] PA request Form and I have faxed it back to [employer].

## 2018-06-10 NOTE — Telephone Encounter (Signed)
Received fax from [facility] stating patient was approved for expedited request for coverage for ENTRESTO 49-50MG  TABLET. Authorization good until 07/28/2019.

## 2018-06-11 ENCOUNTER — Ambulatory Visit (HOSPITAL_COMMUNITY): Payer: Medicare Other

## 2018-06-14 ENCOUNTER — Ambulatory Visit (HOSPITAL_COMMUNITY): Payer: Medicare Other

## 2018-06-15 DIAGNOSIS — M5136 Other intervertebral disc degeneration, lumbar region: Secondary | ICD-10-CM | POA: Diagnosis not present

## 2018-06-16 ENCOUNTER — Ambulatory Visit (HOSPITAL_COMMUNITY): Payer: Medicare Other

## 2018-06-18 ENCOUNTER — Ambulatory Visit (HOSPITAL_COMMUNITY): Payer: Medicare Other

## 2018-06-21 ENCOUNTER — Ambulatory Visit (HOSPITAL_COMMUNITY): Payer: Medicare Other

## 2018-06-21 DIAGNOSIS — H5203 Hypermetropia, bilateral: Secondary | ICD-10-CM | POA: Diagnosis not present

## 2018-06-21 DIAGNOSIS — H52223 Regular astigmatism, bilateral: Secondary | ICD-10-CM | POA: Diagnosis not present

## 2018-06-21 DIAGNOSIS — H2513 Age-related nuclear cataract, bilateral: Secondary | ICD-10-CM | POA: Diagnosis not present

## 2018-06-21 DIAGNOSIS — H524 Presbyopia: Secondary | ICD-10-CM | POA: Diagnosis not present

## 2018-06-21 DIAGNOSIS — H04123 Dry eye syndrome of bilateral lacrimal glands: Secondary | ICD-10-CM | POA: Diagnosis not present

## 2018-06-23 ENCOUNTER — Ambulatory Visit (HOSPITAL_COMMUNITY): Payer: Medicare Other

## 2018-06-25 ENCOUNTER — Ambulatory Visit (HOSPITAL_COMMUNITY): Payer: Medicare Other

## 2018-06-25 ENCOUNTER — Encounter (HOSPITAL_COMMUNITY): Payer: Self-pay

## 2018-06-28 ENCOUNTER — Ambulatory Visit (HOSPITAL_COMMUNITY): Payer: Medicare Other

## 2018-06-30 ENCOUNTER — Ambulatory Visit (HOSPITAL_COMMUNITY): Payer: Medicare Other

## 2018-07-02 ENCOUNTER — Ambulatory Visit (HOSPITAL_COMMUNITY): Payer: Medicare Other

## 2018-07-05 ENCOUNTER — Ambulatory Visit (HOSPITAL_COMMUNITY): Payer: Medicare Other

## 2018-07-07 ENCOUNTER — Ambulatory Visit (HOSPITAL_COMMUNITY): Payer: Medicare Other

## 2018-07-07 ENCOUNTER — Telehealth: Payer: Self-pay | Admitting: Internal Medicine

## 2018-07-07 ENCOUNTER — Encounter: Payer: Self-pay | Admitting: Internal Medicine

## 2018-07-07 NOTE — Telephone Encounter (Signed)
Forms received and placed in CY's look at box.

## 2018-07-08 NOTE — Telephone Encounter (Signed)
Done

## 2018-07-08 NOTE — Telephone Encounter (Signed)
Requested OV notes and signed Download have been signed by CY and faxed back to [employer] to [phone]. Sent to scan. Pt aware that forms have been faxed back. Nothing more needed at this time.

## 2018-07-13 ENCOUNTER — Telehealth: Payer: Self-pay

## 2018-07-13 NOTE — Telephone Encounter (Signed)
[employer] Pt Asst form for re-enrollment of Paul Bryant completed, signed and faxed.

## 2018-07-26 DIAGNOSIS — R609 Edema, unspecified: Secondary | ICD-10-CM | POA: Diagnosis not present

## 2018-07-26 DIAGNOSIS — E1165 Type 2 diabetes mellitus with hyperglycemia: Secondary | ICD-10-CM | POA: Diagnosis not present

## 2018-07-26 DIAGNOSIS — I25119 Atherosclerotic heart disease of native coronary artery with unspecified angina pectoris: Secondary | ICD-10-CM | POA: Diagnosis not present

## 2018-07-26 DIAGNOSIS — I5043 Acute on chronic combined systolic (congestive) and diastolic (congestive) heart failure: Secondary | ICD-10-CM | POA: Diagnosis not present

## 2018-08-03 DIAGNOSIS — Z951 Presence of aortocoronary bypass graft: Secondary | ICD-10-CM | POA: Diagnosis not present

## 2018-08-03 DIAGNOSIS — E1169 Type 2 diabetes mellitus with other specified complication: Secondary | ICD-10-CM | POA: Diagnosis not present

## 2018-08-03 DIAGNOSIS — I5022 Chronic systolic (congestive) heart failure: Secondary | ICD-10-CM | POA: Diagnosis not present

## 2018-08-03 DIAGNOSIS — E1140 Type 2 diabetes mellitus with diabetic neuropathy, unspecified: Secondary | ICD-10-CM | POA: Diagnosis not present

## 2018-08-03 DIAGNOSIS — R600 Localized edema: Secondary | ICD-10-CM | POA: Diagnosis not present

## 2018-08-07 ENCOUNTER — Other Ambulatory Visit: Payer: Self-pay | Admitting: Internal Medicine

## 2018-08-09 NOTE — Telephone Encounter (Signed)
CY Please advise as medication list shows patient on Anoro inhaler. Thanks .

## 2018-08-09 NOTE — Telephone Encounter (Signed)
Please check with patient on this. If he wishes to change back to Symbicort 80 from Anoro,  then ok to refill x 1 year

## 2018-08-12 ENCOUNTER — Telehealth: Payer: Self-pay

## 2018-08-12 NOTE — Telephone Encounter (Signed)
Received notification that pt has been approved for NPAF assistance for the remainder of the calendar year.

## 2018-09-12 NOTE — Progress Notes (Signed)
Cardiology Office Note   Date:  09/13/2018   ID:  Paul Bryant, DOB 1945-08-21, MRN [id number]  PCP:  Jonathon Jordan, MD  Cardiologist:  Dr. Marlou Porch    Chief Complaint  Patient presents with  . Coronary Artery Disease  . Cardiomyopathy      History of Present Illness: Paul Bryant is a 73 y.o. male who presents for CAD and cardiomyopathy with cath last year.   He has a hx of coronary artery disease status post myocardial infarction at age 47 originally treated by Dr. Melvern Banker first treated with POBA then eventually CABG at 68 with hypertension, hyperlipidemia, obesity. He underwent a nuclear stress test on 06/17/10 which showed an ejection fraction of 46%, inferior wall infarction of moderate sized, very small degree of peri-infarct ischemia in this distribution.   His shortness of breath at baseline had increased and resulted in cardiac catheterization on 08/25/17: - med mgt. Would be high risk to try PCI.   Weight loss has been an issue since I have known him.  This is contributing to his orthopedic issues as well. Orthopedic issues seem to be his main complaint. Back and knee.  DVT was negative on 04/26/14.   Sees Dr. [name]. Also a nutritionist in the past.  Frustrated about weight.  Has shortness of breath with activity at baseline. Enjoys grandchildren. Could not hike [place] previously because of shortness of breath.  He has mild bilateral carotid artery plaque noted. He has seen [name] with vascular surgery. Doppler was performed on 08/22/16.  He has also been evaluated for lower extremity vascular issues and note reviewed.  10/05/17-total once again I stressed the importance of really trying to tackle weight loss, calorie restriction.  They reiterated to me that they were eating well, salad with meat for dinner for instance.  Occasionally he will snack on peanuts.  They have seen a nutritionist in the past.  He has lost 15 pounds but then gains it back.   There are many limitations.  At one point, his wife began to cry.  I wishes for him are to try to control his secondary prevention efforts as best as possible.  There were good questions such as if his cholesterol is so good and why does he have all this plaque for instance.  We discussed that there are multiple reasons to have atherosclerosis.  I expressed my concern for him with regards to cardiovascular risk and mortality.  03/15/18 -we discussed Entresto.  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  We discussed the importance of cardiac rehab.  Main complaints are orthopedic in nature.  Chronic back pain.  Still continues to gain weight.  No syncope bleeding orthopnea.  Recently saw Dr. [name].  Note personally reviewed.  09/13/18 Today  Doing well.  Though chronic SOB and edema continue.  He only takes the lasix once a day though he knows to take twice a day.  He has trouble with support stockings so we discussed ones on line with zipper.  He is not exercising and I recommended he return to cardiac rehab.  Also exercises while sitting in the chair.  No chest pain.  No palpitations.  He has numerous scabs on arms from one of his children's new puppy.  He feels his hair is thinning with the Entresto and dry skin.  We discussed increasing his dose but they prefer not to increase.   Past Medical History:  Diagnosis Date  .  CAD (coronary artery disease)   . Carotid artery occlusion   . COPD (chronic obstructive pulmonary disease) (HCC)    mild  . Diverticulitis oct. 2011  . Dizziness   . Gout   . Heart attack ([place])   . Hernia    umbilical and ventral  . Hyperlipidemia   . Hypertension   . IBS (irritable bowel syndrome)   . Neuropathy   . Obesity, morbid ([place])   . OSA on CPAP   . RBBB (right bundle branch block with left anterior fascicular block)   . S/P cardiac catheterization 04/10/2005   ,  2019 with occluded grafts, LIMA is patent  . Sleep apnea   . Urolithiasis       Past Surgical History:  Procedure Laterality Date  . lap sigmoid colectomy due to persistent    . LEFT HEART CATH AND CORS/GRAFTS ANGIOGRAPHY N/A 08/25/2017   Procedure: LEFT HEART CATH AND CORS/GRAFTS ANGIOGRAPHY;  Surgeon: Belva Crome, MD;  Location: [facility];  Service: Cardiovascular;  Laterality: N/A;  . open heart bypass  aug. 1998  . quadruple bypass    . UMBILICAL HERNIA REPAIR     right proctoscopy  . URETERAL STENT PLACEMENT  09-18-2010   Dr Diona Fanti     Current Outpatient Medications  Medication Sig Dispense Refill  . aspirin 81 MG tablet Take 81 mg by mouth daily.      . bisoprolol (ZEBETA) 10 MG tablet TAKE 1 TABLET(10 MG) BY MOUTH TWICE DAILY 60 tablet 7  . doxazosin (CARDURA) 1 MG tablet Take 1 mg by mouth daily.    . furosemide (LASIX) 20 MG tablet Take 2 tablets (40 mg total) by mouth 2 (two) times daily. 180 tablet 3  . gabapentin (NEURONTIN) 300 MG capsule Take 300 mg by mouth daily as needed.    . nitroGLYCERIN (NITROSTAT) 0.4 MG SL tablet Place 1 tablet (0.4 mg total) under the tongue every 5 (five) minutes as needed for chest pain (max 3 doses). 75 tablet 1  . rosuvastatin (CRESTOR) 40 MG tablet TAKE 1 TABLET(40 MG) BY MOUTH DAILY 30 tablet 11  . sacubitril-valsartan (ENTRESTO) 49-51 MG Take 1 tablet by mouth 2 (two) times daily. 60 tablet 11  . traMADol (ULTRAM) 50 MG tablet Take 50 mg by mouth every 6 (six) hours as needed for moderate pain.     . isosorbide mononitrate (IMDUR) 30 MG 24 hr tablet Take 0.5 tablets (15 mg total) by mouth daily. 90 tablet 3   No current facility-administered medications for this visit.     Allergies:   Penicillins    Social History:  The patient  reports that he has never smoked. His smokeless tobacco use includes snuff. He reports current alcohol use. He reports that he does not use drugs.   Family History:  The patient's family history includes Heart attack in his father; Heart disease in his father; Throat  cancer in his mother.    ROS:  General:no colds or fevers, no weight changes Skin:no rashes or ulcers HEENT:no blurred vision, no congestion CV:see HPI PUL:see HPI GI:no diarrhea constipation or melena, no indigestion GU:no hematuria, no dysuria MS:no joint pain, no claudication Neuro:no syncope, no lightheadedness Endo:+ diabetes, no thyroid disease  Wt Readings from Last 3 Encounters:  09/13/18 288 lb (130.6 kg)  03/15/18 291 lb 6.4 oz (132.2 kg)  03/12/18 290 lb 9.6 oz (131.8 kg)     PHYSICAL EXAM: VS:  BP 130/60   Pulse 64  Ht 6' (1.829 m)   Wt 288 lb (130.6 kg)   SpO2 92%   BMI 39.06 kg/m  , BMI Body mass index is 39.06 kg/m. General:Pleasant affect, NAD Skin:Warm and dry, brisk capillary refill HEENT:normocephalic, sclera clear, mucus membranes moist Neck:supple, no JVD, no bruits  Heart:S1S2 RRR with 2/6 systolic murmur, no gallup, rub or click Lungs:clear without rales, rhonchi, or wheezes DXA:JOIN, non tender, + BS, do not palpate liver spleen or masses Ext:1-2+ lower ext edema, 2+ pedal pulses, 2+ radial pulses Neuro:alert and oriented X 3, MAE, follows commands, + facial symmetry    EKG:  EKG is ordered today. The ekg ordered today demonstrates SR with 1st degree AV block RBBB no changes in lateral T waves   Recent Labs: 11/05/2017: Hemoglobin 14.8; Platelets 147 11/06/2017: BUN 11; Creatinine, Ser 0.87; Potassium 4.5; Sodium 139    Lipid Panel No results found for: CHOL, TRIG, HDL, CHOLHDL, VLDL, LDLCALC, LDLDIRECT     Other studies Reviewed: Additional studies/ records that were reviewed today include: . 11/13/15  - Left ventricle: 55%to 60%. - Aortic valve: There was moderate stenosis. There was mild regurgitation. - Aorta: 4.8 cm measurement not very accurate poor image quality suggest CTA/MRI to accurately measure. - Left atrium: The atrium was moderately dilated. - Atrial septum: No defect or patent foramen ovale was  identified.  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 The aorta at the level of the sinuses of Valsalva measures 4.4- 4.6 cm. The proximal ascending thoracic aorta measures 4.4-4.5 cm.   Cardiac cath 08/25/17 Conclusion    Severe native vessel coronary disease with multiple 80-95% stenoses throughout the mid and distal RCA. The vessels apparently recanalized since the last catheterization in 2006.  Totally occluded proximal LAD.  Eccentric 90% proximal circumflex stenosis. No obtuse marginal branches arise from the circumflex.  Totally occluded ramus intermedius.  30% mid left main narrowing.  Total occlusion of saphenous vein graft to the PDA.  Diffuse 50-60% stenosis of the distal one half of the saphenous vein graft to the ramus intermedius.  Eccentric 95% stenosis in the sequential SV graft to the first and second diagonal.  Widely patent left internal mammary artery to the LAD.  Decreased LV systolic function with EF 35%, significant elevation in LVEDP, 26 mmHg.  RECOMMENDATIONS:   Severe native and bypass graft coronary disease as noted above. No interventional options exist in the native circulation. The only interventional option in the bypass graft territory is the relatively focal stenosis in the sequential graft to the diagonals. The graft is calcified. The graft is so would have a higher risk of atheroembolic complications.  Uptitrate diuretic therapy.  Consider high risk PCI on the sequential graft to diagonal #1 and #2 versus continued medical therapy.  Catheterization procedure was very complicated due to the patient's obesity, calcified aorta, and difficulty with catheter engagement from both the left radial and femoral.    Coronary Diagrams   Diagnostic Diagram        TTE 03/18/18 Study Conclusions  - Left ventricle: The cavity size was mildly dilated. There was   moderate concentric  hypertrophy. Systolic function was normal.   The estimated ejection fraction was in the range of 55% to 60%.   Wall motion was normal; there were no regional wall motion   abnormalities. Features are consistent with a pseudonormal left   ventricular filling pattern, with concomitant abnormal relaxation   and increased filling pressure (grade 2 diastolic dysfunction).  Doppler parameters are consistent with high ventricular filling   pressure. - Aortic valve: Valve mobility was restricted. There was moderate   stenosis. There was moderate regurgitation. Mean gradient (S): 23   mm Hg. - Aorta: Ascending aortic diameter: 41.95 mm (S). - Ascending aorta: The ascending aorta was mildly dilated. - Mitral valve: Transvalvular velocity was within the normal range.   There was no evidence for stenosis. There was no regurgitation. - Left atrium: The atrium was severely dilated. - Right ventricle: The cavity size was normal. Wall thickness was   normal. Systolic function was normal. - Right atrium: The atrium was mildly dilated. - Atrial septum: A patent foramen ovale cannot be excluded. - Tricuspid valve: There was trivial regurgitation.   ASSESSMENT AND PLAN:  1.  CAD with Mi at 20 and CABG, last cath 2019  With CAD and treated medically.  Stable  Follow up with Dr. Marlou Porch in 6 months.  2. Dilated CM with improved EF and is on entresto.  We discussed going to higher dose but he is having side effects so will hold for now.    2.  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   3.  Dilated aortic root, will recheck echo prior to next visit with Dr. Marlou Porch with aortic root at 4.5 cm on CT.  Will follow with echo.    4.  Aortic stenosis follow with echo.  5.  Obesity wt is down a few pounds, have encouraged exercise with cardiac rehab and then pool    Current medicines are reviewed with the patient today.  The patient Has no concerns regarding medicines.  The following changes  have been made:  See above Labs/ tests ordered today include:see above  Disposition:   FU:  see above  Signed, Cecilie Kicks, NP  09/13/2018 8:49 AM    [facility], [address] [place], [place] Phone: [phone]; Fax: [phone]

## 2018-09-13 ENCOUNTER — Ambulatory Visit (INDEPENDENT_AMBULATORY_CARE_PROVIDER_SITE_OTHER): Payer: Medicare Other | Admitting: Cardiology

## 2018-09-13 ENCOUNTER — Encounter: Payer: Self-pay | Admitting: Internal Medicine

## 2018-09-13 ENCOUNTER — Encounter: Payer: Self-pay | Admitting: Cardiology

## 2018-09-13 VITALS — BP 130/60 | HR 64 | Ht 72.0 in | Wt 288.0 lb

## 2018-09-13 DIAGNOSIS — I440 Atrioventricular block, first degree: Secondary | ICD-10-CM | POA: Diagnosis not present

## 2018-09-13 DIAGNOSIS — I2510 Atherosclerotic heart disease of native coronary artery without angina pectoris: Secondary | ICD-10-CM | POA: Diagnosis not present

## 2018-09-13 DIAGNOSIS — I77810 Thoracic aortic ectasia: Secondary | ICD-10-CM | POA: Diagnosis not present

## 2018-09-13 DIAGNOSIS — Z951 Presence of aortocoronary bypass graft: Secondary | ICD-10-CM | POA: Diagnosis not present

## 2018-09-13 DIAGNOSIS — I420 Dilated cardiomyopathy: Secondary | ICD-10-CM

## 2018-09-13 MED ORDER — SACUBITRIL-VALSARTAN 49-51 MG PO TABS: 1.0000 | ORAL_TABLET | Freq: Two times a day (BID) | ORAL | 3 refills | Status: DC

## 2018-09-13 MED ORDER — ISOSORBIDE MONONITRATE ER 30 MG PO TB24: 15.0000 mg | ORAL_TABLET | Freq: Every day | ORAL | 3 refills | Status: DC

## 2018-09-13 NOTE — Telephone Encounter (Signed)
The pt had an OV with Cecilie Kicks, NP this morning. While here he gave [place] a letter from [employer] stating that he has been approved for pt asst with his Delene Loll but that they need a [employer].  Cecilie Kicks, NP provided a signed Entresto 49-51 mg #180 with 3 refills RX and I have faxed it to [employer] at [phone].

## 2018-09-13 NOTE — Patient Instructions (Addendum)
Medication Instructions:  Your physician recommends that you continue on your current medications as directed. Please refer to the Current Medication list given to you today.  If you need a refill on your cardiac medications before your next appointment, please call your pharmacy.   Lab work: None ordered  If you have labs (blood work) drawn today and your tests are completely normal, you will receive your results only by: [name] MyChart Message (if you have MyChart) OR . A paper copy in the mail If you have any lab test that is abnormal or we need to change your treatment, we will call you to review the results.  Testing/Procedures: Your physician has requested that you have an echocardiogram IN 6 MONTHS 1 WEEK BEFORE YOU SEE DR. Marlou Porch.  Echocardiography is a painless test that uses sound waves to create images of your heart. It provides your doctor with information about the size and shape of your heart and how well your heart's chambers and valves are working. This procedure takes approximately one hour. There are no restrictions for this procedure.    Follow-Up: At [facility], you and your health needs are our priority.  As part of our continuing mission to provide you with exceptional heart care, we have created designated Provider Care Teams.  These Care Teams include your primary Cardiologist (physician) and Advanced Practice Providers (APPs -  Physician Assistants and Nurse Practitioners) who all work together to provide you with the care you need, when you need it. You will need a follow up appointment in 6 months.  Please call our office 2 months in advance to schedule this appointment.  You may see Candee Furbish, MD or one of the following Advanced Practice Providers on your designated Care Team:   Truitt Merle, NP Cecilie Kicks, NP . Kathyrn Drown, NP  Any Other Special Instructions Will Be Listed Below (If Applicable).  Echocardiogram An echocardiogram is a procedure that uses  painless sound waves (ultrasound) to produce an image of the heart. Images from an echocardiogram can provide important information about:  Signs of coronary artery disease (CAD).  Aneurysm detection. An aneurysm is a weak or damaged part of an artery wall that bulges out from the normal force of blood pumping through the body.  Heart size and shape. Changes in the size or shape of the heart can be associated with certain conditions, including heart failure, aneurysm, and CAD.  Heart muscle function.  Heart valve function.  Signs of a past heart attack.  Fluid buildup around the heart.  Thickening of the heart muscle.  A tumor or infectious growth around the heart valves. Tell a health care provider about:  Any allergies you have.  All medicines you are taking, including vitamins, herbs, eye drops, creams, and over-the-counter medicines.  Any blood disorders you have.  Any surgeries you have had.  Any medical conditions you have.  Whether you are pregnant or may be pregnant. What are the risks? Generally, this is a safe procedure. However, problems may occur, including:  Allergic reaction to dye (contrast) that may be used during the procedure. What happens before the procedure? No specific preparation is needed. You may eat and drink normally. What happens during the procedure?   An IV tube may be inserted into one of your veins.  You may receive contrast through this tube. A contrast is an injection that improves the quality of the pictures from your heart.  A gel will be applied to your chest.  A wand-like tool (transducer) will be moved over your chest. The gel will help to transmit the sound waves from the transducer.  The sound waves will harmlessly bounce off of your heart to allow the heart images to be captured in real-time motion. The images will be recorded on a computer. The procedure may vary among health care providers and hospitals. What happens after  the procedure?  You may return to your normal, everyday life, including diet, activities, and medicines, unless your health care provider tells you not to do that. Summary  An echocardiogram is a procedure that uses painless sound waves (ultrasound) to produce an image of the heart.  Images from an echocardiogram can provide important information about the size and shape of your heart, heart muscle function, heart valve function, and fluid buildup around your heart.  You do not need to do anything to prepare before this procedure. You may eat and drink normally.  After the echocardiogram is completed, you may return to your normal, everyday life, unless your health care provider tells you not to do that. This information is not intended to replace advice given to you by your health care provider. Make sure you discuss any questions you have with your health care provider. Document Released: 07/11/2000 Document Revised: 08/16/2016 Document Reviewed: 08/16/2016 Elsevier Interactive Patient Education  2019 [employer].

## 2018-09-14 ENCOUNTER — Encounter: Payer: Self-pay | Admitting: Internal Medicine

## 2018-09-14 ENCOUNTER — Ambulatory Visit (INDEPENDENT_AMBULATORY_CARE_PROVIDER_SITE_OTHER): Payer: Medicare Other | Admitting: Internal Medicine

## 2018-09-14 VITALS — BP 134/68 | HR 72 | Ht 72.0 in | Wt 289.6 lb

## 2018-09-14 DIAGNOSIS — R0602 Shortness of breath: Secondary | ICD-10-CM | POA: Diagnosis not present

## 2018-09-14 DIAGNOSIS — I2510 Atherosclerotic heart disease of native coronary artery without angina pectoris: Secondary | ICD-10-CM | POA: Diagnosis not present

## 2018-09-14 DIAGNOSIS — G4733 Obstructive sleep apnea (adult) (pediatric): Secondary | ICD-10-CM | POA: Diagnosis not present

## 2018-09-14 MED ORDER — TIOTROPIUM BROMIDE-OLODATEROL 2.5-2.5 MCG/ACT IN AERS: 2.0000 | INHALATION_SPRAY | Freq: Every day | RESPIRATORY_TRACT | 12 refills | Status: DC

## 2018-09-14 NOTE — Progress Notes (Signed)
HPI M never smoker followed for Dyspnea, OSA, complicagted by morbid obesity, CAD/Hx MI  NPSG 04/27/05 Moderate OSA, AHI 16/ hr, weight 290 lbs PFT-07/24/2011-normal spirometry flows within significant response to bronchodilator. Diffusion mildly reduced. FEV1/FVC 0.77. 6 Minute Walk Test- 96%, 88%, 98% 432 meters. Note exertional desaturation.   --------------------------------------------------------------------------------------------------  03/12/2018- 73 yo M never smoker followed for Dyspnea, OSA, complicated by morbid obesity, CAD/Hx MI , aortic stenosis, DM 2, HBP,  CPAP auto 5-20/-new machine 2018 -----[facility]. Pt wears CPAP nightly and DL attached. Ne New supplies needed at this time.  Insurance change-no longer covers [employer]. He did not like Anoro dry powder so we will try Bevespi. CPAP pressure starts to low for his comfort.  Download 97% compliance AHI 6.8/hour with pressure range between 7 and 12 CWP, set for 5-20. He describes coughing with drinking liquids.  Sometimes he also gets watery nose with this and a vasomotor pattern.  He refuses all nose sprays.  He agrees to swallowing evaluation for suspected LPR.  09/14/2018- 21 yo M never smoker followed for Dyspnea, OSA, complicated by morbid obesity, CAD/Hx MI/ CM , aortic stenosis, DM 2, HBP,  CPAP auto 10-20/-new machine 2018/ [facility] -----Follows for: OSA Body weight today 289 pounds Download 100% compliance AHI 8.9/hour He admits to being very inactive.  No acute changes.  Dyspnea with exertion is normal for him.  No cough or wheeze.  He had tried [employer] and then [employer].  Stiolto is cheaper.  Not clear that either makes any difference. He is quite comfortable with his CPAP and does not sleep without it. Discussed his ongoing work with cardiology.  ROS-see HPI   + = positive Constitutional:   No-   weight loss, night sweats, fevers, chills, fatigue, lassitude. HEENT:   No-  headaches, difficulty swallowing,  tooth/dental problems, sore throat,       No-  sneezing, itching, ear ache, nasal congestion, +post nasal drip,  CV:  No-   chest pain, orthopnea, PND, swelling in lower extremities, anasarca, dizziness, palpitations Resp: + shortness of breath with exertion, + at rest.              No-   productive cough,   non-productive cough,  No- coughing up of blood.              No-   change in color of mucus.  No- wheezing.   Skin: No-   rash or lesions. GI:  No-   heartburn, indigestion, abdominal pain, nausea, vomiting,  GU: MS:  +  joint pain or swelling.   Neuro-     nothing unusual Psych:  No- change in mood or affect. No depression or anxiety.  No memory loss.  OBJ- General- Alert, Oriented, Affect-appropriate, Distress- none acute, + morbid obesity Skin- rash-none, lesions- none, excoriation+ arms( son's puppy) Lymphadenopathy- none Head- atraumatic            Eyes- Gross vision intact, PERRLA, conjunctivae clear secretions            Ears- Hearing, canals-normal            Nose- Clear, no-Septal dev, mucus, polyps, erosion, perforation             Throat- Mallampati IV , mucosa clear , drainage- none, tonsils- atrophic Neck- flexible , trachea midline, no stridor , thyroid nl, carotid no bruit Chest - symmetrical excursion , unlabored           Heart/CV- RRR , +  2/6 Systolic murmur , no gallop  , no rub, nl s1 s2                           - JVD- none , edema- 1+, stasis changes- , varices- none           Lung- clear to P&A, wheeze- none, cough- none , dullness-none, rub- none           Chest wall-  Abd-  Br/ Gen/ Rectal- Not done, not indicated Extrem- cyanosis- none, clubbing, none, atrophy- none, strength- nl Neuro- grossly intact to observation

## 2018-09-14 NOTE — Assessment & Plan Note (Signed)
I am not convinced that bronchodilators help in any.  His dyspnea likely reflects his heart disease and his obesity with deconditioning. Plan-he is given printed prescription for Stiolto.  He will skip it for a while then start back.  If he can tell an improvement then he can fill a prescription.

## 2018-09-14 NOTE — Patient Instructions (Signed)
Order- DME [facility]- continue CPAP auto 10-20, mask of choice, humidifier, supplies, AirView. Please replace supplies, hoses, filters now.   Order- referral to sleep center for mask fitting  Printed script for [employer]. Try without it for awhile, then restart to see if you can tell if it helps any.

## 2018-09-14 NOTE — Assessment & Plan Note (Signed)
He continues to benefit from CPAP with improved sleep quality.  Download confirms good compliance and adequate control. Plan-continue CPAP auto 10-20

## 2018-09-15 ENCOUNTER — Other Ambulatory Visit: Payer: Self-pay | Admitting: Cardiology

## 2018-10-04 DIAGNOSIS — M4800 Spinal stenosis, site unspecified: Secondary | ICD-10-CM | POA: Diagnosis not present

## 2018-10-04 DIAGNOSIS — E1169 Type 2 diabetes mellitus with other specified complication: Secondary | ICD-10-CM | POA: Diagnosis not present

## 2018-10-04 DIAGNOSIS — R600 Localized edema: Secondary | ICD-10-CM | POA: Diagnosis not present

## 2018-10-04 DIAGNOSIS — E1140 Type 2 diabetes mellitus with diabetic neuropathy, unspecified: Secondary | ICD-10-CM | POA: Diagnosis not present

## 2018-10-04 DIAGNOSIS — Z79899 Other long term (current) drug therapy: Secondary | ICD-10-CM | POA: Diagnosis not present

## 2018-10-04 DIAGNOSIS — Z951 Presence of aortocoronary bypass graft: Secondary | ICD-10-CM | POA: Diagnosis not present

## 2018-10-18 DIAGNOSIS — J209 Acute bronchitis, unspecified: Secondary | ICD-10-CM | POA: Diagnosis not present

## 2018-10-18 DIAGNOSIS — E1149 Type 2 diabetes mellitus with other diabetic neurological complication: Secondary | ICD-10-CM | POA: Diagnosis not present

## 2018-10-28 ENCOUNTER — Other Ambulatory Visit: Payer: Self-pay | Admitting: Family Medicine

## 2018-10-28 ENCOUNTER — Ambulatory Visit
Admission: RE | Admit: 2018-10-28 | Discharge: 2018-10-28 | Disposition: A | Discharge status: Home / Self Care | Payer: Medicare Other | Source: Ambulatory Visit | Attending: Family Medicine | Admitting: Family Medicine

## 2018-10-28 DIAGNOSIS — R05 Cough: Secondary | ICD-10-CM | POA: Diagnosis not present

## 2018-10-28 DIAGNOSIS — R059 Cough, unspecified: Secondary | ICD-10-CM

## 2018-12-09 ENCOUNTER — Other Ambulatory Visit: Payer: Self-pay | Admitting: Cardiology

## 2018-12-19 ENCOUNTER — Other Ambulatory Visit: Payer: Self-pay | Admitting: Cardiology

## 2018-12-29 DIAGNOSIS — E1169 Type 2 diabetes mellitus with other specified complication: Secondary | ICD-10-CM | POA: Diagnosis not present

## 2019-02-04 ENCOUNTER — Other Ambulatory Visit: Payer: Self-pay | Admitting: Cardiology

## 2019-02-07 ENCOUNTER — Other Ambulatory Visit: Payer: Self-pay | Admitting: Cardiology

## 2019-02-07 MED ORDER — ROSUVASTATIN CALCIUM 40 MG PO TABS: 40.0000 mg | ORAL_TABLET | Freq: Every day | ORAL | 2 refills | Status: DC

## 2019-02-07 MED ORDER — FUROSEMIDE 20 MG PO TABS: ORAL_TABLET | ORAL | 2 refills | Status: DC

## 2019-02-09 ENCOUNTER — Other Ambulatory Visit: Payer: Self-pay | Admitting: Cardiology

## 2019-02-09 MED ORDER — ISOSORBIDE MONONITRATE ER 30 MG PO TB24: ORAL_TABLET | ORAL | 2 refills | Status: DC

## 2019-02-11 ENCOUNTER — Other Ambulatory Visit: Payer: Self-pay | Admitting: Cardiology

## 2019-02-11 MED ORDER — BISOPROLOL FUMARATE 10 MG PO TABS: 10.0000 mg | ORAL_TABLET | Freq: Two times a day (BID) | ORAL | 2 refills | Status: DC

## 2019-03-02 ENCOUNTER — Other Ambulatory Visit: Payer: Self-pay

## 2019-03-02 ENCOUNTER — Encounter (INDEPENDENT_AMBULATORY_CARE_PROVIDER_SITE_OTHER): Payer: Self-pay

## 2019-03-02 ENCOUNTER — Ambulatory Visit (HOSPITAL_COMMUNITY): Payer: Medicare Other | Attending: Internal Medicine

## 2019-03-02 DIAGNOSIS — I420 Dilated cardiomyopathy: Secondary | ICD-10-CM | POA: Diagnosis not present

## 2019-03-03 NOTE — Progress Notes (Signed)
Pt has been made aware of normal result and verbalized understanding.  jw 03/03/2019

## 2019-03-16 DIAGNOSIS — M25561 Pain in right knee: Secondary | ICD-10-CM | POA: Diagnosis not present

## 2019-03-16 DIAGNOSIS — M170 Bilateral primary osteoarthritis of knee: Secondary | ICD-10-CM | POA: Diagnosis not present

## 2019-03-16 DIAGNOSIS — M25562 Pain in left knee: Secondary | ICD-10-CM | POA: Diagnosis not present

## 2019-03-17 ENCOUNTER — Other Ambulatory Visit: Payer: Self-pay | Admitting: Cardiology

## 2019-03-23 DIAGNOSIS — M170 Bilateral primary osteoarthritis of knee: Secondary | ICD-10-CM | POA: Diagnosis not present

## 2019-03-29 DIAGNOSIS — M170 Bilateral primary osteoarthritis of knee: Secondary | ICD-10-CM | POA: Diagnosis not present

## 2019-04-19 DIAGNOSIS — Z23 Encounter for immunization: Secondary | ICD-10-CM | POA: Diagnosis not present

## 2019-04-26 ENCOUNTER — Ambulatory Visit: Payer: Medicare Other | Admitting: Nurse Practitioner

## 2019-05-04 ENCOUNTER — Ambulatory Visit: Payer: Medicare Other | Admitting: Cardiology

## 2019-05-26 ENCOUNTER — Ambulatory Visit (INDEPENDENT_AMBULATORY_CARE_PROVIDER_SITE_OTHER): Payer: Medicare Other | Admitting: Cardiology

## 2019-05-26 ENCOUNTER — Other Ambulatory Visit: Payer: Self-pay

## 2019-05-26 ENCOUNTER — Encounter: Payer: Self-pay | Admitting: Cardiology

## 2019-05-26 DIAGNOSIS — I2510 Atherosclerotic heart disease of native coronary artery without angina pectoris: Secondary | ICD-10-CM

## 2019-05-26 DIAGNOSIS — I420 Dilated cardiomyopathy: Secondary | ICD-10-CM | POA: Diagnosis not present

## 2019-05-26 DIAGNOSIS — Z951 Presence of aortocoronary bypass graft: Secondary | ICD-10-CM | POA: Diagnosis not present

## 2019-05-26 NOTE — Progress Notes (Signed)
[name] [address], [address] [place], [place]  [address] Phone: [phone] Fax:  [phone]  Date:  05/26/2019   ID:  Paul Bryant, DOB 1945/11/08, MRN [id number]  PCP:  Jonathon Jordan, MD   History of Present Illness: Paul Bryant is a 73 y.o. male with hx of coronary artery disease status post myocardial infarction at age 24 originally treated by Dr. Melvern Banker first treated with POBA then eventually CABG at 68 with hypertension, hyperlipidemia, obesity. He underwent a nuclear stress test on 06/17/10 which showed an ejection fraction of 46%, inferior wall infarction of moderate sized, very small degree of peri-infarct ischemia in this distribution.   His shortness of breath at baseline had increased and resulted in cardiac catheterization on 08/25/17: - med mgt. Would be high risk to try PCI.   Weight loss has been an issue since I have known him.  This is contributing to his orthopedic issues as well. Orthopedic issues seem to be his main complaint. Back and knee.  DVT was negative on 04/26/14.   Sees Dr. [name]. Also a nutritionist in the past.  Frustrated about weight.  Has shortness of breath with activity at baseline. Enjoys grandchildren. Could not hike [place] previously because of shortness of breath.  He has mild bilateral carotid artery plaque noted. He has seen [name] with vascular surgery. Doppler was performed on 08/22/16.  He has also been evaluated for lower extremity vascular issues and note reviewed.  10/05/17-total once again I stressed the importance of really trying to tackle weight loss, calorie restriction.  They reiterated to me that they were eating well, salad with meat for dinner for instance.  Occasionally he will snack on peanuts.  They have seen a nutritionist in the past.  He has lost 15 pounds but then gains it back.  There are many limitations.  At one point, his wife began to cry.  I wishes for him are to try to control his secondary  prevention efforts as best as possible.  There were good questions such as if his cholesterol is so good and why does he have all this plaque for instance.  We discussed that there are multiple reasons to have atherosclerosis.  I expressed my concern for him with regards to cardiovascular risk and mortality.  03/15/18 -we discussed Entresto.  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  We discussed the importance of cardiac rehab.  Main complaints are orthopedic in nature.  Chronic back pain.  Still continues to gain weight.  No syncope bleeding orthopnea.  Recently saw Dr. [name].  Note personally reviewed.  05/26/2019-here for cardiomyopathy follow-up.  Most recent echocardiogram shows improvement in EF 50%.  This is excellent.  He is able to afford his Delene Loll now after the help of our assistance program.  He is still struggling with Invokana.  Still having some calf pain but we did check his lower extremity ABIs last year and they were normal.  Struggling with weight as well.  No fevers chills nausea vomiting syncope bleeding  Wt Readings from Last 3 Encounters:  05/26/19 281 lb (127.5 kg)  09/14/18 289 lb 9.6 oz (131.4 kg)  09/13/18 288 lb (130.6 kg)     Past Medical History:  Diagnosis Date   CAD (coronary artery disease)    Carotid artery occlusion    COPD (chronic obstructive pulmonary disease) (HCC)    mild   Diverticulitis oct. 2011   Dizziness  Gout    Heart attack ([facility])    Hernia    umbilical and ventral   Hyperlipidemia    Hypertension    IBS (irritable bowel syndrome)    Neuropathy    Obesity, morbid (HCC)    OSA on CPAP    RBBB (right bundle branch block with left anterior fascicular block)    S/P cardiac catheterization 04/10/2005   ,  2019 with occluded grafts, LIMA is patent   Sleep apnea    Urolithiasis     Past Surgical History:  Procedure Laterality Date   lap sigmoid colectomy due to persistent     LEFT HEART CATH  AND CORS/GRAFTS ANGIOGRAPHY N/A 08/25/2017   Procedure: LEFT HEART CATH AND CORS/GRAFTS ANGIOGRAPHY;  Surgeon: Belva Crome, MD;  Location: [facility];  Service: Cardiovascular;  Laterality: N/A;   open heart bypass  aug. 1998   quadruple bypass     UMBILICAL HERNIA REPAIR     right proctoscopy   URETERAL STENT PLACEMENT  09-18-2010   Dr Diona Fanti    Current Outpatient Medications  Medication Sig Dispense Refill   aspirin 81 MG tablet Take 81 mg by mouth daily.       bisoprolol (ZEBETA) 10 MG tablet Take 1 tablet (10 mg total) by mouth 2 (two) times daily. 180 tablet 2   canagliflozin (INVOKANA) 100 MG TABS tablet Take 100 mg by mouth daily.     doxazosin (CARDURA) 1 MG tablet Take 1 mg by mouth daily.     furosemide (LASIX) 20 MG tablet TAKE 2 TABLETS(40 MG) BY MOUTH TWICE DAILY 180 tablet 0   isosorbide mononitrate (IMDUR) 30 MG 24 hr tablet TAKE 1 TABLET(30 MG) BY MOUTH DAILY 90 tablet 2   nitroGLYCERIN (NITROSTAT) 0.4 MG SL tablet Place 1 tablet (0.4 mg total) under the tongue every 5 (five) minutes as needed for chest pain (max 3 doses). 75 tablet 1   rosuvastatin (CRESTOR) 40 MG tablet Take 1 tablet (40 mg total) by mouth daily. 90 tablet 2   sacubitril-valsartan (ENTRESTO) 49-51 MG Take 1 tablet by mouth 2 (two) times daily. 180 tablet 3   Tiotropium Bromide-Olodaterol (STIOLTO RESPIMAT) 2.5-2.5 MCG/ACT AERS Inhale 2 puffs into the lungs daily. 4 g 12   traMADol (ULTRAM) 50 MG tablet Take 50 mg by mouth every 6 (six) hours as needed for moderate pain.      No current facility-administered medications for this visit.     Allergies:    Allergies  Allergen Reactions   Penicillins Hives, Rash and Other (See Comments)    Has patient had a PCN reaction causing immediate rash, facial/tongue/throat swelling, SOB or lightheadedness with hypotension: Unknown Has patient had a PCN reaction causing severe rash involving mucus membranes or skin necrosis: No Has  patient had a PCN reaction that required hospitalization: No Has patient had a PCN reaction occurring within the last 10 years: No If all of the above answers are "NO", then may proceed with Cephalosporin use.     Social History:  The patient  reports that he has never smoked. His smokeless tobacco use includes snuff. He reports current alcohol use. He reports that he does not use drugs.   ROS:  Please see the history of present illness.  Unless stated above all other review of systems negative PHYSICAL EXAM: VS:  BP 132/60    Pulse 70    Ht 6' (1.829 m)    Wt 281 lb (127.5 kg)  SpO2 90%    BMI 38.11 kg/m   GEN: Well nourished, well developed, in no acute distress  HEENT: normal  Neck: no JVD, carotid bruits, or masses Cardiac: RRR; 3/6 SM, rubs, or gallops,no edema  Respiratory:  clear to auscultation bilaterally, normal work of breathing GI: soft, nontender, nondistended, + BS MS: no deformity or atrophy  Skin: warm and dry, no rash Neuro:  Alert and Oriented x 3, Strength and sensation are intact Psych: euthymic mood, full affect    EKG:  12/02/16-sinus rhythm first-degree AV block, right bundle branch block, LVH, inferior infarct pattern. PR interval 218 ms personally viewed-prior 10/29/15 showed sinus rhythm with first-degree AV block, PR interval 212 ms with right bundle branch block, old inferior infarct pattern. Personally viewed-prior 09/15/13 Sinus rhythm, right bundle branch block, 67     ECHO: 10/21/13 - Left ventricle:  of 50% to 55%. - Aortic valve: Cusp separation was moderately reduced. There was mild to moderate stenosis. Mild regurgitation. Mean gradient: 18 mm Hg (S). Valve area: 1.72cm^2(VTI). Valve area: 1.52cm^2 (Vmax). - Aorta: Dilated aortic root measuring 44 mm. - Compared to the prior study in 2012 there is no significant change.  11/13/15  - Left ventricle: 55%  to 60%. - Aortic valve: There was moderate stenosis. There was mild    regurgitation. - Aorta: 4.8 cm measurement not very accurate poor image quality   suggest CTA/MRI to accurately measure. - Left atrium: The atrium was moderately dilated. - Atrial septum: No defect or patent foramen ovale was identified.  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 The aorta at the level of the sinuses of Valsalva measures 4.4- 4.6 cm. The proximal ascending thoracic aorta measures 4.4-4.5 cm.   Cardiac cath 08/25/17 Conclusion    Severe native vessel coronary disease with multiple 80-95% stenoses throughout the mid and distal RCA. The vessels apparently recanalized since the last catheterization in 2006.  Totally occluded proximal LAD.  Eccentric 90% proximal circumflex stenosis. No obtuse marginal branches arise from the circumflex.  Totally occluded ramus intermedius.  30% mid left main narrowing.  Total occlusion of saphenous vein graft to the PDA.  Diffuse 50-60% stenosis of the distal one half of the saphenous vein graft to the ramus intermedius.  Eccentric 95% stenosis in the sequential SV graft to the first and second diagonal.  Widely patent left internal mammary artery to the LAD.  Decreased LV systolic function with EF 35%, significant elevation in LVEDP, 26 mmHg.  RECOMMENDATIONS:   Severe native and bypass graft coronary disease as noted above. No interventional options exist in the native circulation. The only interventional option in the bypass graft territory is the relatively focal stenosis in the sequential graft to the diagonals. The graft is calcified. The graft is so would have a higher risk of atheroembolic complications.  Uptitrate diuretic therapy.  Consider high risk PCI on the sequential graft to diagonal #1 and #2 versus continued medical therapy.  Catheterization procedure was very complicated due to the patient's obesity, calcified aorta, and difficulty with catheter engagement from  both the left radial and femoral.    Coronary Diagrams   Diagnostic Diagram         ASSESSMENT AND PLAN:  CAD/post CABG/dilated cardiomyopathy/NYHA class III  - Shortness of breath, deconditioning, morbid obesity, very limited.  Cardiac catheterization reviewed performed by Dr. Tamala Julian in early 2019.  Continue to stress medical management.  Continue Entresto medium range dose, prior on Avapro.  We were  able to help with cost, he calls [employer] each month.  May need to go back on Avapro at some point.  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 Further deterioration of conduction system may warrant pacemaker in the future.  No high risk symptoms such as syncope.  Overall stable.  Morbid obesity  -Once again encouraged weight loss.  Decrease carbohydrates.  Hyperlipidemia  - Continue with Crestor 40. Goal-directed therapy.  No changes made.  Continue with current plan.  Dilated aortic root  - CT scan showing 4.5 cm.  -Continue to follow with echocardiogram which showed 4.6 cm in August 2020.  We will repeat echocardiogram in 1 year..  .  Continue optimize blood pressure.  Aortic stenosis  - Previously moderate/mild.  Continue to monitor.  Murmur heard on exam.  No changes.   Multiple orthopedic issues including back pain, spinal stenosis, knee pain  -He would be of high risk for any general anesthetic at this point.  COPD  - Dr. [name], could not afford inhaler.  Continue with obstructive sleep apnea treatment.  Once again at high risk for cardiovascular events in the future.  6 month f/u with Cecilie Kicks, NP. 12 with me.   Signed, Candee Furbish, MD [facility]  05/26/2019 4:42 PM

## 2019-05-26 NOTE — Patient Instructions (Signed)
Medication Instructions:  The current medical regimen is effective;  continue present plan and medications.  *If you need a refill on your cardiac medications before your next appointment, please call your pharmacy*  Follow-Up: At [facility], you and your health needs are our priority.  As part of our continuing mission to provide you with exceptional heart care, we have created designated Provider Care Teams.  These Care Teams include your primary Cardiologist (physician) and Advanced Practice Providers (APPs -  Physician Assistants and Nurse Practitioners) who all work together to provide you with the care you need, when you need it.  Your next appointment:   6 months  The format for your next appointment:   In Person  Provider:   Cecilie Kicks, NP   Follow up in 1 year with Dr. Marlou Porch.  You will receive a letter in the mail 2 months before you are due.  Please call us when you receive this letter to schedule your follow up appointment.   Thank you for choosing [facility]!!

## 2019-05-31 DIAGNOSIS — J019 Acute sinusitis, unspecified: Secondary | ICD-10-CM | POA: Diagnosis not present

## 2019-07-20 ENCOUNTER — Telehealth: Payer: Self-pay

## 2019-07-20 NOTE — Telephone Encounter (Signed)
Provider portion of Entresto Pt Asst faxed in for pt.

## 2019-08-12 DIAGNOSIS — Z23 Encounter for immunization: Secondary | ICD-10-CM | POA: Diagnosis not present

## 2019-09-12 DIAGNOSIS — Z23 Encounter for immunization: Secondary | ICD-10-CM | POA: Diagnosis not present

## 2019-09-12 NOTE — Telephone Encounter (Signed)
**Note De-Identified [name] Obfuscation** Letter received Paul Bryant fax from [employer] stating that they have approved the pt for asst with his Entresto. Approval good for the remainder of this year (2021). Pt ID: [id number]  The letter states that they have notified the pt of this approval as well.

## 2019-09-15 ENCOUNTER — Ambulatory Visit: Payer: Medicare Other | Admitting: Internal Medicine

## 2019-10-03 ENCOUNTER — Encounter: Payer: Self-pay | Admitting: Internal Medicine

## 2019-10-03 ENCOUNTER — Ambulatory Visit (INDEPENDENT_AMBULATORY_CARE_PROVIDER_SITE_OTHER): Payer: Medicare Other | Admitting: Internal Medicine

## 2019-10-03 ENCOUNTER — Ambulatory Visit: Payer: Medicare Other | Admitting: Internal Medicine

## 2019-10-03 ENCOUNTER — Other Ambulatory Visit: Payer: Self-pay

## 2019-10-03 ENCOUNTER — Ambulatory Visit (INDEPENDENT_AMBULATORY_CARE_PROVIDER_SITE_OTHER): Payer: Medicare Other

## 2019-10-03 VITALS — BP 134/64 | HR 72 | Temp 97.8°F | Ht 72.0 in | Wt 280.0 lb

## 2019-10-03 DIAGNOSIS — J449 Chronic obstructive pulmonary disease, unspecified: Secondary | ICD-10-CM | POA: Diagnosis not present

## 2019-10-03 DIAGNOSIS — R0602 Shortness of breath: Secondary | ICD-10-CM

## 2019-10-03 DIAGNOSIS — G4733 Obstructive sleep apnea (adult) (pediatric): Secondary | ICD-10-CM | POA: Diagnosis not present

## 2019-10-03 MED ORDER — BUDESONIDE-FORMOTEROL FUMARATE 160-4.5 MCG/ACT IN AERO: INHALATION_SPRAY | RESPIRATORY_TRACT | 12 refills | Status: DC

## 2019-10-03 NOTE — Progress Notes (Signed)
HPI M never smoker followed for Dyspnea, OSA, complicagted by morbid obesity, CAD/Hx MI  NPSG 04/27/05 Moderate OSA, AHI 16/ hr, weight 290 lbs PFT-07/24/2011-normal spirometry flows within significant response to bronchodilator. Diffusion mildly reduced. FEV1/FVC 0.77. 6 Minute Walk Test- 96%, 88%, 98% 432 meters. Note exertional desaturation.   --------------------------------------------------------------------------------------------------   09/14/2018- 74 yo M never smoker followed for Dyspnea, OSA, complicated by morbid obesity, CAD/Hx MI/ CM , aortic stenosis, DM 2, HBP,  CPAP auto 10-20/-new machine 2018/ [facility] -----Follows for: OSA Body weight today 289 pounds Download 100% compliance AHI 8.9/hour He admits to being very inactive.  No acute changes.  Dyspnea with exertion is normal for him.  No cough or wheeze.  He had tried Mining [employer] and then [employer].  Stiolto is cheaper.  Not clear that either makes any difference. He is quite comfortable with his CPAP and does not sleep without it. Discussed his ongoing work with cardiology.  10/03/19- 69 yo M never smoker followed for Dyspnea, OSA, complicated by morbid obesity, CAD/Hx MI/ CM , aortic stenosis, DM 2, HBP,  CPAP auto 10-20/-new machine 2018/ [facility] Download compliance 100%, AHI 17.7/ hr Body weight today 280 lbs -----f/u OSA. Breathing is at his baseline.  Minimizes cough and wheeze. Couldn't tell that sample Symbicort did much. Doing well with CPAP- download reviewed.   ROS-see HPI   + = positive Constitutional:   No-   weight loss, night sweats, fevers, chills, fatigue, lassitude. HEENT:   No-  headaches, difficulty swallowing, tooth/dental problems, sore throat,       No-  sneezing, itching, ear ache, nasal congestion, +post nasal drip,  CV:  No-   chest pain, orthopnea, PND, swelling in lower extremities, anasarca, dizziness, palpitations Resp: + shortness of breath with exertion, + at rest.              No-   productive  cough,   non-productive cough,  No- coughing up of blood.              No-   change in color of mucus.  No- wheezing.   Skin: No-   rash or lesions. GI:  No-   heartburn, indigestion, abdominal pain, nausea, vomiting,  GU: MS:  +  joint pain or swelling.   Neuro-     nothing unusual Psych:  No- change in mood or affect. No depression or anxiety.  No memory loss.  OBJ- General- Alert, Oriented, Affect-appropriate, Distress- none acute, + morbid obesity Skin- rash-none, lesions- none, excoriation+ arms( son's puppy) Lymphadenopathy- none Head- atraumatic            Eyes- Gross vision intact, PERRLA, conjunctivae clear secretions            Ears- Hearing, canals-normal            Nose- Clear, no-Septal dev, mucus, polyps, erosion, perforation             Throat- Mallampati IV , mucosa clear , drainage- none, tonsils- atrophic Neck- flexible , trachea midline, no stridor , thyroid nl, carotid no bruit Chest - symmetrical excursion , unlabored           Heart/CV- RRR , + 2/6 Systolic murmur , no gallop  , no rub, nl s1 s2                           - JVD- none , edema- 1+, stasis changes- , varices- none  Lung- +few crackles in bases., wheeze- none, cough- none , dullness-none, rub- none           Chest wall-  Abd-  Br/ Gen/ Rectal- Not done, not indicated Extrem- cyanosis- none, clubbing, none, atrophy- none, strength- nl Neuro- grossly intact to observation

## 2019-10-03 NOTE — Patient Instructions (Addendum)
Script printed to refill Symbicort. I suggest you try off it for a few day to see if it is doing anything for your breathing. If you see no difference, just stay off it. It can always be started back later.  Order- CXR   Dx   COPD mixed type, question ILD   Ok to continue CPAP auto 10-20, mask of choice, humidifier, supplies, AirView/ card  Please call if we can help

## 2019-10-04 ENCOUNTER — Other Ambulatory Visit: Payer: Self-pay

## 2019-10-04 DIAGNOSIS — J449 Chronic obstructive pulmonary disease, unspecified: Secondary | ICD-10-CM

## 2019-10-14 ENCOUNTER — Other Ambulatory Visit: Payer: Medicare Other

## 2019-10-16 NOTE — Assessment & Plan Note (Signed)
Benefits from CPAP with good compliance and control Plan- continue auto 10-20 

## 2019-10-16 NOTE — Assessment & Plan Note (Signed)
Dyspnea mainly due to conditioning, obesity and cardiac limitation. Not clear that bronchodilators help, but we will let him have another try with Symbicort. I hear faint crackles and will get CXR looking for any indication of ILD to warrant CT.

## 2019-10-20 DIAGNOSIS — I5022 Chronic systolic (congestive) heart failure: Secondary | ICD-10-CM | POA: Diagnosis not present

## 2019-10-20 DIAGNOSIS — I350 Nonrheumatic aortic (valve) stenosis: Secondary | ICD-10-CM | POA: Diagnosis not present

## 2019-10-20 DIAGNOSIS — I10 Essential (primary) hypertension: Secondary | ICD-10-CM | POA: Diagnosis not present

## 2019-10-20 DIAGNOSIS — E1149 Type 2 diabetes mellitus with other diabetic neurological complication: Secondary | ICD-10-CM | POA: Diagnosis not present

## 2019-10-20 DIAGNOSIS — Z0000 Encounter for general adult medical examination without abnormal findings: Secondary | ICD-10-CM | POA: Diagnosis not present

## 2019-10-20 DIAGNOSIS — E785 Hyperlipidemia, unspecified: Secondary | ICD-10-CM | POA: Diagnosis not present

## 2019-10-20 DIAGNOSIS — J449 Chronic obstructive pulmonary disease, unspecified: Secondary | ICD-10-CM | POA: Diagnosis not present

## 2019-10-20 DIAGNOSIS — Z79899 Other long term (current) drug therapy: Secondary | ICD-10-CM | POA: Diagnosis not present

## 2019-10-20 DIAGNOSIS — D696 Thrombocytopenia, unspecified: Secondary | ICD-10-CM | POA: Diagnosis not present

## 2019-10-20 DIAGNOSIS — Z951 Presence of aortocoronary bypass graft: Secondary | ICD-10-CM | POA: Diagnosis not present

## 2019-10-20 DIAGNOSIS — M48061 Spinal stenosis, lumbar region without neurogenic claudication: Secondary | ICD-10-CM | POA: Diagnosis not present

## 2019-10-20 DIAGNOSIS — Z125 Encounter for screening for malignant neoplasm of prostate: Secondary | ICD-10-CM | POA: Diagnosis not present

## 2019-10-20 DIAGNOSIS — G4733 Obstructive sleep apnea (adult) (pediatric): Secondary | ICD-10-CM | POA: Diagnosis not present

## 2019-10-24 NOTE — Progress Notes (Signed)
Virtual Visit via Telephone Note   This visit type was conducted due to national recommendations for restrictions regarding the COVID-19 Pandemic (e.g. social distancing) in an effort to limit this patient's exposure and mitigate transmission in our community.  Due to his co-morbid illnesses, this patient is at least at moderate risk for complications without adequate follow up.  This format is felt to be most appropriate for this patient at this time.  The patient did not have access to video technology/had technical difficulties with video requiring transitioning to audio format only (telephone).  All issues noted in this document were discussed and addressed.  No physical exam could be performed with this format.  Please refer to the patient's chart for his  consent to telehealth for [facility].   The patient was identified using 2 identifiers.  Date:  10/25/2019   ID:  Paul Bryant, DOB Dec 02, 1945, MRN [id number]  Patient Location: Home Provider Location: Office  PCP:  Jonathon Jordan, MD  Cardiologist:  Candee Furbish, MD  Electrophysiologist:  None   Evaluation Performed:  Follow-Up Visit  Chief Complaint:  CAD  History of Present Illness:    Paul Bryant is a 74 y.o. male with hx of coronary artery disease status post myocardial infarction at age 63 originally treated by Dr. Melvern Banker first treated with POBA then eventually CABG at 40 with hypertension, hyperlipidemia, obesity. He underwent a nuclear stress test on 06/17/10 which showed an ejection fraction of 46%, inferior wall infarction of moderate sized, very small degree of peri-infarct ischemia in this distribution.   His shortness of breath at baseline had increased and resulted in cardiac catheterization on 08/25/17: - med mgt. Would be high risk to try PCI.   Weight loss has been an issue since I have known him.  This is contributing to his orthopedic issues as well. Orthopedic issues seem to be his main  complaint. Back and knee.  DVT was negative on 04/26/14.   Sees Dr. [name]. Also a nutritionist in the past.  Frustrated about weight.  Has shortness of breath with activity at baseline. Enjoys grandchildren. Could not hike [place] previously because of shortness of breath.  He has mild bilateral carotid artery plaque noted. He has seen [name] with vascular surgery. Doppler was performed on 08/22/16.  He has also been evaluated for lower extremity vascular issues and note reviewed.  10/05/17-total once again I stressed the importance of really trying to tackle weight loss, calorie restriction.  They reiterated to me that they were eating well, salad with meat for dinner for instance.  Occasionally he will snack on peanuts.  They have seen a nutritionist in the past.  He has lost 15 pounds but then gains it back.  There are many limitations.  At one point, his wife began to cry.  I wishes for him are to try to control his secondary prevention efforts as best as possible.  There were good questions such as if his cholesterol is so good and why does he have all this plaque for instance.  We discussed that there are multiple reasons to have atherosclerosis.  I expressed my concern for him with regards to cardiovascular risk and mortality.  03/15/18 -we discussed Entresto.  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  We discussed the importance of cardiac rehab.  Main complaints are orthopedic in nature.  Chronic back pain.  Still continues to gain weight.  No syncope bleeding orthopnea.  Recently  saw Dr. [name].  Note personally reviewed.  05/26/2019-here for cardiomyopathy follow-up.  Most recent echocardiogram shows improvement in EF 50%.  This is excellent.  He is able to afford his Delene Loll now after the help of our assistance program.  He is still struggling with Invokana.  Still having some calf pain but we did check his lower extremity ABIs last year and they were normal.   Struggling with weight as well.  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  Will need echo in August for dilated aortic root.  CT at 4.5 cm  And AS.  Continue CPAP for OSA Now feeling well though arthritis holds him back and causes him to be off balance.   He is having CT of chest for lung nodule per Dr. [name].  His BP is elevated and PCP increased cardura.  Still with lower ext edema.  Increasing lasix has not helped.  He does need EKG.  He has had his COVID vaccine.    The patient does not have symptoms concerning for COVID-19 infection (fever, chills, cough, or new shortness of breath).    Past Medical History:  Diagnosis Date  . CAD (coronary artery disease)   . Carotid artery occlusion   . COPD (chronic obstructive pulmonary disease) (HCC)    mild  . Diverticulitis oct. 2011  . Dizziness   . Gout   . Heart attack ([place])   . Hernia    umbilical and ventral  . Hyperlipidemia   . Hypertension   . IBS (irritable bowel syndrome)   . Neuropathy   . Obesity, morbid ([place])   . OSA on CPAP   . RBBB (right bundle branch block with left anterior fascicular block)   . S/P cardiac catheterization 04/10/2005   ,  2019 with occluded grafts, LIMA is patent  . Sleep apnea   . Urolithiasis    Past Surgical History:  Procedure Laterality Date  . lap sigmoid colectomy due to persistent    . LEFT HEART CATH AND CORS/GRAFTS ANGIOGRAPHY N/A 08/25/2017   Procedure: LEFT HEART CATH AND CORS/GRAFTS ANGIOGRAPHY;  Surgeon: Belva Crome, MD;  Location: [facility];  Service: Cardiovascular;  Laterality: N/A;  . open heart bypass  aug. 1998  . quadruple bypass    . UMBILICAL HERNIA REPAIR     right proctoscopy  . URETERAL STENT PLACEMENT  09-18-2010   Dr Diona Fanti     Current Meds  Medication Sig  . aspirin 81 MG tablet Take 81 mg by mouth daily.    . bisoprolol (ZEBETA) 10 MG tablet Take 1 tablet (10 mg total) by mouth 2  (two) times daily.  . budesonide-formoterol (SYMBICORT) 160-4.5 MCG/ACT inhaler Inhale 2 puffs then rinse mouth, twice daily  . canagliflozin (INVOKANA) 100 MG TABS tablet Take 100 mg by mouth daily.  [name] doxazosin (CARDURA) 1 MG tablet Take by mouth 2 (two) times daily.   . furosemide (LASIX) 20 MG tablet TAKE 2 TABLETS(40 MG) BY MOUTH TWICE DAILY  . isosorbide mononitrate (IMDUR) 30 MG 24 hr tablet TAKE 1 TABLET(30 MG) BY MOUTH DAILY (Patient taking differently: 15 mg. TAKE 1 TABLET(30 MG) BY MOUTH DAILY)  . nitroGLYCERIN (NITROSTAT) 0.4 MG SL tablet Place 1 tablet (0.4 mg total) under the tongue every 5 (five) minutes as needed for chest pain (max 3 doses).  . rosuvastatin (CRESTOR) 40 MG tablet Take 1 tablet (40 mg total) by mouth  daily.  . sacubitril-valsartan (ENTRESTO) 49-51 MG Take 1 tablet by mouth 2 (two) times daily.     Allergies:   Penicillins   Social History   Tobacco Use  . Smoking status: Never Smoker  . Smokeless tobacco: Current User    Types: Snuff  Substance Use Topics  . Alcohol use: Yes    Comment: socially 2-3 cans of beer  . Drug use: No     Family Hx: The patient's family history includes Heart attack in his father; Heart disease in his father; Throat cancer in his mother. There is no history of Colon cancer or Liver disease.  ROS:   Please see the history of present illness.    General:no colds or fevers, + weight changes Skin:no rashes or ulcers HEENT:no blurred vision, no congestion CV:see HPI PUL:see HPI GI:no diarrhea constipation or melena, no indigestion GU:no hematuria, no dysuria MS:no joint pain, no claudication Neuro:no syncope, no lightheadedness Endo:+ diabetes, no thyroid disease  All other systems reviewed and are negative.   Prior CV studies:   The following studies were reviewed today: ECHO: 10/21/13 - Left ventricle:  of 50% to 55%. - Aortic valve: Cusp separation was moderately reduced. There was mild to moderate  stenosis. Mild regurgitation. Mean gradient: 18 mm Hg (S). Valve area: 1.72cm^2(VTI). Valve area: 1.52cm^2 (Vmax). - Aorta: Dilated aortic root measuring 44 mm. - Compared to the prior study in 2012 there is no significant change.  11/13/15  - Left ventricle: 55%to 60%. - Aortic valve: There was moderate stenosis. There was mild regurgitation. - Aorta: 4.8 cm measurement not very accurate poor image quality suggest CTA/MRI to accurately measure. - Left atrium: The atrium was moderately dilated. - Atrial septum: No defect or patent foramen ovale was identified.  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 The aorta at the level of the sinuses of Valsalva measures 4.4- 4.6 cm. The proximal ascending thoracic aorta measures 4.4-4.5 cm.   03/18/18 Echo Study Conclusions   - Left ventricle: The cavity size was mildly dilated. There was  moderate concentric hypertrophy. Systolic function was normal.  The estimated ejection fraction was in the range of 55% to 60%.  Wall motion was normal; there were no regional wall motion  abnormalities. Features are consistent with a pseudonormal left  ventricular filling pattern, with concomitant abnormal relaxation  and increased filling pressure (grade 2 diastolic dysfunction).  Doppler parameters are consistent with high ventricular filling  pressure.  - Aortic valve: Valve mobility was restricted. There was moderate  stenosis. There was moderate regurgitation. Mean gradient (S): 23  mm Hg.  - Aorta: Ascending aortic diameter: 41.95 mm (S).  - Ascending aorta: The ascending aorta was mildly dilated.  - Mitral valve: Transvalvular velocity was within the normal range.  There was no evidence for stenosis. There was no regurgitation.  - Left atrium: The atrium was severely dilated.  - Right ventricle: The cavity size was normal. Wall thickness was  normal. Systolic  function was normal.  - Right atrium: The atrium was mildly dilated.  - Atrial septum: A patent foramen ovale cannot be excluded.  - Tricuspid valve: There was trivial regurgitation.   Cardiac cath 08/25/17 Conclusion    Severe native vessel coronary disease with multiple 80-95% stenoses throughout the mid and distal RCA. The vessels apparently recanalized since the last catheterization in 2006.  Totally occluded proximal LAD.  Eccentric 90% proximal circumflex stenosis. No obtuse marginal branches arise from the circumflex.  Totally  occluded ramus intermedius.  30% mid left main narrowing.  Total occlusion of saphenous vein graft to the PDA.  Diffuse 50-60% stenosis of the distal one half of the saphenous vein graft to the ramus intermedius.  Eccentric 95% stenosis in the sequential SV graft to the first and second diagonal.  Widely patent left internal mammary artery to the LAD.  Decreased LV systolic function with EF 35%, significant elevation in LVEDP, 26 mmHg.  RECOMMENDATIONS:   Severe native and bypass graft coronary disease as noted above. No interventional options exist in the native circulation. The only interventional option in the bypass graft territory is the relatively focal stenosis in the sequential graft to the diagonals. The graft is calcified. The graft is so would have a higher risk of atheroembolic complications.  Uptitrate diuretic therapy.  Consider high risk PCI on the sequential graft to diagonal #1 and #2 versus continued medical therapy.  Catheterization procedure was very complicated due to the patient's obesity, calcified aorta, and difficulty with catheter engagement from both the left radial and femoral.    Coronary Diagrams   Diagnostic Diagram        ECHO 03/02/19 IMPRESSIONS    1. The left ventricle has low normal systolic function, with an ejection  fraction of 50-55%. The cavity size was mildly dilated. There is  mildly  increased left ventricular wall thickness. Left ventricular diastolic  Doppler parameters are consistent with  impaired relaxation.  2. The right ventricle has normal systolic function. The cavity was  normal. There is no increase in right ventricular wall thickness. Right  ventricular systolic pressure could not be assessed.  3. There is mild dilation of the aortic root measuring 41 mm and moderate  dilation of the ascending aorta measuring 46 mm.  4. The aortic valve was not well visualized for morphology. Moderate  calcification of the aortic valve. Aortic valve regurgitation is trivial  by color flow Doppler. Mild-moderate stenosis of the aortic valve, mean  systolic gradient 21 mmHg, calculated  valve area 1.42 cm2 using an LVOT diameter of 2.8 cm and LVOT TVI of 16  cm.  5. When compared to the prior study: Images from prior exam not readily  available for review, brief side by side comparison suggests no  significant change in LV function.  6. Left atrial size was mild-moderately dilated.  7. Right atrial size was mildly dilated.   FINDINGS  Left Ventricle: The left ventricle has low normal systolic function, with  an ejection fraction of 50-55%. The cavity size was mildly dilated. There  is mildly increased left ventricular wall thickness. Left ventricular  diastolic Doppler parameters are  consistent with impaired relaxation.   Right Ventricle: The right ventricle has normal systolic function. The  cavity was normal. There is no increase in right ventricular wall  thickness. Right ventricular systolic pressure could not be assessed.   Left Atrium: Left atrial size was mild-moderately dilated.   Right Atrium: Right atrial size was mildly dilated. Right atrial pressure  is estimated at 3 mmHg.   Interatrial Septum: No atrial level shunt detected by color flow Doppler.   Pericardium: There is no evidence of pericardial effusion.   Mitral Valve: The  mitral valve is normal in structure. Mitral valve  regurgitation is trivial by color flow Doppler.   Tricuspid Valve: The tricuspid valve is normal in structure. Tricuspid  valve regurgitation is trivial by color flow Doppler.   Aortic Valve: The aortic valve was not well visualized Moderate  calcification of the aortic valve, with moderately decreased cusp  excursion. Aortic valve regurgitation is trivial by color flow Doppler.  There is Mild-moderate stenosis of the aortic valve,  with a calculated valve area of 1.42 cm.   Pulmonic Valve: The pulmonic valve was grossly normal. Pulmonic valve  regurgitation is trivial by color flow Doppler.   Aorta: The aorta is abnormal in size and structure. There is moderate  dilatation of the aortic root and of the ascending aorta measuring 46 mm.   Venous: The inferior vena cava measures 1.20 cm, is normal in size with  greater than 50% respiratory variability.   Compared to previous exam: Images from prior exam not readily available  for review, brief side by side comparison suggests no significant change  in LV function.   Labs/Other Tests and Data Reviewed:    EKG:  No ECG reviewed.   Pt will have EKG today or tomorrow.    Recent Labs: No results found for requested labs within last 8760 hours.   Recent Lipid Panel No results found for: CHOL, TRIG, HDL, CHOLHDL, LDLCALC, LDLDIRECT  Wt Readings from Last 3 Encounters:  10/25/19 280 lb (127 kg)  10/03/19 280 lb (127 kg)  05/26/19 281 lb (127.5 kg)     Objective:    Vital Signs:  BP (!) 142/67   Pulse 78   Ht 6' (1.829 m)   Wt 280 lb (127 kg)   BMI 37.97 kg/m    VITAL SIGNS:  reviewed  GENERAL NAD Pulmonary can speak in complete sentences without SOB Neuro A&O X 3 MAE follows commands  ASSESSMENT & PLAN:    1. CAD s/p CABG /cardiomyopathy NYHA class III, stable since last visit - has lower ext edema lasix does not seem to help - support stocking if he can manage.   If become worrisome then could try metolazone to [facility]  He continues on entresto and stable.     2.  Will do EKG in office with hx of 1st degree AV block and RBBB   3.  Dilated aortic root for chest CTA next month for lungs - not sure he can complete will plan for echo in august before visit with Dr. Marlou Porch.  4.  HLD followed by PCP reviewed labs from PCP and LDL of 54 continue statin.  5.  Aortic stenosis monitor echo in August.   followup 6 months with Dr. Marlou Porch.   COVID-19 Education: The signs and symptoms of COVID-19 were discussed with the patient and how to seek care for testing (follow up with PCP or arrange E-visit).  The importance of social distancing was discussed today.  Time:   Today, I have spent 18 minutes with the patient with telehealth technology discussing the above problems.     Medication Adjustments/Labs and Tests Ordered: Current medicines are reviewed at length with the patient today.  Concerns regarding medicines are outlined above.   Tests Ordered: No orders of the defined types were placed in this encounter.   Medication Changes: No orders of the defined types were placed in this encounter.   Follow Up:  In person 6 months  Signed, Cecilie Kicks, NP  10/25/2019 8:39 AM    [facility]

## 2019-10-25 ENCOUNTER — Other Ambulatory Visit: Payer: Self-pay

## 2019-10-25 ENCOUNTER — Encounter: Payer: Self-pay | Admitting: Cardiology

## 2019-10-25 ENCOUNTER — Telehealth: Payer: Self-pay | Admitting: Internal Medicine

## 2019-10-25 ENCOUNTER — Telehealth (INDEPENDENT_AMBULATORY_CARE_PROVIDER_SITE_OTHER): Payer: Medicare Other | Admitting: Cardiology

## 2019-10-25 VITALS — BP 142/67 | HR 78 | Ht 72.0 in | Wt 280.0 lb

## 2019-10-25 DIAGNOSIS — I77810 Thoracic aortic ectasia: Secondary | ICD-10-CM

## 2019-10-25 DIAGNOSIS — I2510 Atherosclerotic heart disease of native coronary artery without angina pectoris: Secondary | ICD-10-CM | POA: Diagnosis not present

## 2019-10-25 DIAGNOSIS — I350 Nonrheumatic aortic (valve) stenosis: Secondary | ICD-10-CM

## 2019-10-25 DIAGNOSIS — I4510 Unspecified right bundle-branch block: Secondary | ICD-10-CM

## 2019-10-25 DIAGNOSIS — I440 Atrioventricular block, first degree: Secondary | ICD-10-CM

## 2019-10-25 DIAGNOSIS — Z951 Presence of aortocoronary bypass graft: Secondary | ICD-10-CM

## 2019-10-25 MED ORDER — DIAZEPAM 5 MG PO TABS: ORAL_TABLET | ORAL | 0 refills | Status: DC

## 2019-10-25 NOTE — Patient Instructions (Addendum)
Medication Instructions:  Your physician recommends that you continue on your current medications as directed. Please refer to the Current Medication list given to you today.  *If you need a refill on your cardiac medications before your next appointment, please call your pharmacy*   Lab Work: None ordered  If you have labs (blood work) drawn today and your tests are completely normal, you will receive your results only by: [name] MyChart Message (if you have MyChart) OR . A paper copy in the mail If you have any lab test that is abnormal or we need to change your treatment, we will call you to review the results.   Testing/Procedures: Your physician has requested that you have an echocardiogram IN AUGUST.  SOMEONE WILL CONTACT YOU TO SCHEDULE.   Echocardiography is a painless test that uses sound waves to create images of your heart. It provides your doctor with information about the size and shape of your heart and how well your heart's chambers and valves are working. This procedure takes approximately one hour. There are no restrictions for this procedure.     Follow-Up: At [facility], you and your health needs are our priority.  As part of our continuing mission to provide you with exceptional heart care, we have created designated Provider Care Teams.  These Care Teams include your primary Cardiologist (physician) and Advanced Practice Providers (APPs -  Physician Assistants and Nurse Practitioners) who all work together to provide you with the care you need, when you need it.  We recommend signing up for the patient portal called "MyChart".  Sign up information is provided on this After Visit Summary.  MyChart is used to connect with patients for Virtual Visits (Telemedicine).  Patients are able to view lab/test results, encounter notes, upcoming appointments, etc.  Non-urgent messages can be sent to your provider as well.   To learn more about what you can do with MyChart, go to  [email or web address].    Your next appointment:   6 month(s)  The format for your next appointment:   In Person  Provider:   You may see Candee Furbish, MD or one of the following Advanced Practice Providers on your designated Care Team:    Truitt Merle, NP  Cecilie Kicks, NP  Kathyrn Drown, NP    Other Instructions  Echocardiogram An echocardiogram is a procedure that uses painless sound waves (ultrasound) to produce an image of the heart. Images from an echocardiogram can provide important information about:  Signs of coronary artery disease (CAD).  Aneurysm detection. An aneurysm is a weak or damaged part of an artery wall that bulges out from the normal force of blood pumping through the body.  Heart size and shape. Changes in the size or shape of the heart can be associated with certain conditions, including heart failure, aneurysm, and CAD.  Heart muscle function.  Heart valve function.  Signs of a past heart attack.  Fluid buildup around the heart.  Thickening of the heart muscle.  A tumor or infectious growth around the heart valves. Tell a health care provider about:  Any allergies you have.  All medicines you are taking, including vitamins, herbs, eye drops, creams, and over-the-counter medicines.  Any blood disorders you have.  Any surgeries you have had.  Any medical conditions you have.  Whether you are pregnant or may be pregnant. What are the risks? Generally, this is a safe procedure. However, problems may occur, including:  Allergic reaction to dye (contrast) that may  be used during the procedure. What happens before the procedure? No specific preparation is needed. You may eat and drink normally. What happens during the procedure?   An IV tube may be inserted into one of your veins.  You may receive contrast through this tube. A contrast is an injection that improves the quality of the pictures from your heart.  A gel will be  applied to your chest.  A wand-like tool (transducer) will be moved over your chest. The gel will help to transmit the sound waves from the transducer.  The sound waves will harmlessly bounce off of your heart to allow the heart images to be captured in real-time motion. The images will be recorded on a computer. The procedure may vary among health care providers and hospitals. What happens after the procedure?  You may return to your normal, everyday life, including diet, activities, and medicines, unless your health care provider tells you not to do that. Summary  An echocardiogram is a procedure that uses painless sound waves (ultrasound) to produce an image of the heart.  Images from an echocardiogram can provide important information about the size and shape of your heart, heart muscle function, heart valve function, and fluid buildup around your heart.  You do not need to do anything to prepare before this procedure. You may eat and drink normally.  After the echocardiogram is completed, you may return to your normal, everyday life, unless your health care provider tells you not to do that. This information is not intended to replace advice given to you by your health care provider. Make sure you discuss any questions you have with your health care provider. Document Revised: 11/04/2018 Document Reviewed: 08/16/2016 Elsevier Patient Education  [place].

## 2019-10-25 NOTE — Telephone Encounter (Signed)
Dr. [name], please advise if you are okay prescribing valium for pt to be able to take prior to upcoming CT.  Allergies  Allergen Reactions  . Penicillins Hives, Rash and Other (See Comments)    Has patient had a PCN reaction causing immediate rash, facial/tongue/throat swelling, SOB or lightheadedness with hypotension: Unknown Has patient had a PCN reaction causing severe rash involving mucus membranes or skin necrosis: No Has patient had a PCN reaction that required hospitalization: No Has patient had a PCN reaction occurring within the last 10 years: No If all of the above answers are "NO", then may proceed with Cephalosporin use.      Current Outpatient Medications:  .  aspirin 81 MG tablet, Take 81 mg by mouth daily.  , Disp: , Rfl:  .  bisoprolol (ZEBETA) 10 MG tablet, Take 1 tablet (10 mg total) by mouth 2 (two) times daily., Disp: 180 tablet, Rfl: 2 .  budesonide-formoterol (SYMBICORT) 160-4.5 MCG/ACT inhaler, Inhale 2 puffs then rinse mouth, twice daily, Disp: 1 Inhaler, Rfl: 12 .  canagliflozin (INVOKANA) 100 MG TABS tablet, Take 100 mg by mouth daily., Disp: , Rfl:  .  doxazosin (CARDURA) 1 MG tablet, Take by mouth 2 (two) times daily. , Disp: , Rfl:  .  furosemide (LASIX) 20 MG tablet, TAKE 2 TABLETS(40 MG) BY MOUTH TWICE DAILY, Disp: 180 tablet, Rfl: 0 .  isosorbide mononitrate (IMDUR) 30 MG 24 hr tablet, TAKE 1 TABLET(30 MG) BY MOUTH DAILY (Patient taking differently: 15 mg. TAKE 1 TABLET(30 MG) BY MOUTH DAILY), Disp: 90 tablet, Rfl: 2 .  nitroGLYCERIN (NITROSTAT) 0.4 MG SL tablet, Place 1 tablet (0.4 mg total) under the tongue every 5 (five) minutes as needed for chest pain (max 3 doses)., Disp: 75 tablet, Rfl: 1 .  rosuvastatin (CRESTOR) 40 MG tablet, Take 1 tablet (40 mg total) by mouth daily., Disp: 90 tablet, Rfl: 2 .  sacubitril-valsartan (ENTRESTO) 49-51 MG, Take 1 tablet by mouth 2 (two) times daily., Disp: 180 tablet, Rfl: 3

## 2019-10-25 NOTE — Telephone Encounter (Signed)
Valium script e-sent for use before procedure

## 2019-10-26 ENCOUNTER — Other Ambulatory Visit: Payer: Self-pay | Admitting: *Deleted

## 2019-10-26 ENCOUNTER — Other Ambulatory Visit (INDEPENDENT_AMBULATORY_CARE_PROVIDER_SITE_OTHER): Payer: Medicare Other | Admitting: *Deleted

## 2019-10-26 DIAGNOSIS — I4510 Unspecified right bundle-branch block: Secondary | ICD-10-CM | POA: Diagnosis not present

## 2019-10-26 DIAGNOSIS — I77810 Thoracic aortic ectasia: Secondary | ICD-10-CM | POA: Diagnosis not present

## 2019-10-26 NOTE — Progress Notes (Signed)
[place]

## 2019-10-26 NOTE — Telephone Encounter (Signed)
Called and spoke with pt's wife letting her know that CY sent rx for valium to pharmacy for pt to use prior to CT. She verbalized understanding. Nothing further needed.

## 2019-10-26 NOTE — Progress Notes (Signed)
Pt came in for EKG to follow up on his virtual visit with Cecilie Kicks, NP, 10/25/2019. Vin Bhagat, PA-C looked at the EKG and per him, no changes from previous.  Rechecked pt's blood pressure today and it was 132/70.

## 2019-10-27 ENCOUNTER — Other Ambulatory Visit: Payer: Medicare Other

## 2019-11-02 DIAGNOSIS — E875 Hyperkalemia: Secondary | ICD-10-CM | POA: Diagnosis not present

## 2019-11-11 ENCOUNTER — Ambulatory Visit
Admission: RE | Admit: 2019-11-11 | Discharge: 2019-11-11 | Disposition: A | Discharge status: Home / Self Care | Payer: Medicare Other | Source: Ambulatory Visit | Attending: Internal Medicine | Admitting: Internal Medicine

## 2019-11-11 ENCOUNTER — Other Ambulatory Visit: Payer: Self-pay

## 2019-11-11 DIAGNOSIS — J984 Other disorders of lung: Secondary | ICD-10-CM | POA: Diagnosis not present

## 2019-11-11 DIAGNOSIS — I517 Cardiomegaly: Secondary | ICD-10-CM | POA: Diagnosis not present

## 2019-11-11 DIAGNOSIS — J449 Chronic obstructive pulmonary disease, unspecified: Secondary | ICD-10-CM

## 2019-11-11 MED ORDER — IOPAMIDOL (ISOVUE-300) INJECTION 61%
75.0000 mL | Freq: Once | INTRAVENOUS | Status: AC | PRN
  Administered 2019-11-11: 75 mL via INTRAVENOUS

## 2019-11-14 NOTE — Progress Notes (Signed)
Patient identification verified. Results of recent CT of chest reviewed. Per Dr. [name], lungs are clear with no nodules or masses. There is only minimal scarring. Also noted are calcification in coronary arteries and aortic valve, as expected. Patient verbalized understanding of results.

## 2019-11-18 DIAGNOSIS — N481 Balanitis: Secondary | ICD-10-CM | POA: Diagnosis not present

## 2019-11-18 DIAGNOSIS — B356 Tinea cruris: Secondary | ICD-10-CM | POA: Diagnosis not present

## 2019-11-23 DIAGNOSIS — C44329 Squamous cell carcinoma of skin of other parts of face: Secondary | ICD-10-CM | POA: Diagnosis not present

## 2019-12-15 DIAGNOSIS — R0602 Shortness of breath: Secondary | ICD-10-CM | POA: Diagnosis not present

## 2019-12-15 DIAGNOSIS — J449 Chronic obstructive pulmonary disease, unspecified: Secondary | ICD-10-CM | POA: Diagnosis not present

## 2019-12-27 ENCOUNTER — Encounter (HOSPITAL_COMMUNITY): Payer: Self-pay

## 2019-12-27 DIAGNOSIS — Z08 Encounter for follow-up examination after completed treatment for malignant neoplasm: Secondary | ICD-10-CM | POA: Diagnosis not present

## 2019-12-27 DIAGNOSIS — Z85828 Personal history of other malignant neoplasm of skin: Secondary | ICD-10-CM | POA: Diagnosis not present

## 2020-01-03 ENCOUNTER — Other Ambulatory Visit: Payer: Self-pay | Admitting: Cardiology

## 2020-01-12 DIAGNOSIS — H52223 Regular astigmatism, bilateral: Secondary | ICD-10-CM | POA: Diagnosis not present

## 2020-01-12 DIAGNOSIS — H04123 Dry eye syndrome of bilateral lacrimal glands: Secondary | ICD-10-CM | POA: Diagnosis not present

## 2020-01-12 DIAGNOSIS — H524 Presbyopia: Secondary | ICD-10-CM | POA: Diagnosis not present

## 2020-01-12 DIAGNOSIS — H2513 Age-related nuclear cataract, bilateral: Secondary | ICD-10-CM | POA: Diagnosis not present

## 2020-01-12 DIAGNOSIS — H5203 Hypermetropia, bilateral: Secondary | ICD-10-CM | POA: Diagnosis not present

## 2020-01-17 ENCOUNTER — Other Ambulatory Visit: Payer: Self-pay

## 2020-01-17 ENCOUNTER — Ambulatory Visit (HOSPITAL_COMMUNITY): Payer: Medicare Other | Attending: Cardiology

## 2020-01-17 DIAGNOSIS — I2510 Atherosclerotic heart disease of native coronary artery without angina pectoris: Secondary | ICD-10-CM | POA: Diagnosis not present

## 2020-01-17 DIAGNOSIS — J449 Chronic obstructive pulmonary disease, unspecified: Secondary | ICD-10-CM | POA: Diagnosis not present

## 2020-01-17 DIAGNOSIS — I77810 Thoracic aortic ectasia: Secondary | ICD-10-CM | POA: Diagnosis present

## 2020-01-17 DIAGNOSIS — I4510 Unspecified right bundle-branch block: Secondary | ICD-10-CM | POA: Insufficient documentation

## 2020-01-17 DIAGNOSIS — I351 Nonrheumatic aortic (valve) insufficiency: Secondary | ICD-10-CM | POA: Diagnosis not present

## 2020-01-31 ENCOUNTER — Other Ambulatory Visit: Payer: Self-pay

## 2020-01-31 ENCOUNTER — Encounter: Payer: Self-pay | Admitting: Internal Medicine

## 2020-01-31 ENCOUNTER — Ambulatory Visit (INDEPENDENT_AMBULATORY_CARE_PROVIDER_SITE_OTHER): Payer: Medicare Other | Admitting: Internal Medicine

## 2020-01-31 ENCOUNTER — Ambulatory Visit (INDEPENDENT_AMBULATORY_CARE_PROVIDER_SITE_OTHER): Payer: Medicare Other

## 2020-01-31 VITALS — BP 137/77 | HR 77 | Temp 97.4°F | Ht 72.0 in | Wt 289.4 lb

## 2020-01-31 DIAGNOSIS — R609 Edema, unspecified: Secondary | ICD-10-CM | POA: Diagnosis not present

## 2020-01-31 DIAGNOSIS — R0609 Other forms of dyspnea: Secondary | ICD-10-CM

## 2020-01-31 DIAGNOSIS — G4733 Obstructive sleep apnea (adult) (pediatric): Secondary | ICD-10-CM | POA: Diagnosis not present

## 2020-01-31 DIAGNOSIS — R0600 Dyspnea, unspecified: Secondary | ICD-10-CM | POA: Diagnosis not present

## 2020-01-31 DIAGNOSIS — I2510 Atherosclerotic heart disease of native coronary artery without angina pectoris: Secondary | ICD-10-CM

## 2020-01-31 DIAGNOSIS — R0602 Shortness of breath: Secondary | ICD-10-CM | POA: Diagnosis not present

## 2020-01-31 LAB — BASIC METABOLIC PANEL
BUN: 22 mg/dL (ref 6–23)
CO2: 28 mEq/L (ref 19–32)
Calcium: 9.3 mg/dL (ref 8.4–10.5)
Chloride: 103 mEq/L (ref 96–112)
Creatinine, Ser: 0.88 mg/dL (ref 0.40–1.50)
GFR: 84.71 mL/min (ref >60.00)
Glucose, Bld: 153 mg/dL — ABNORMAL HIGH (ref 70–99)
Potassium: 4.7 mEq/L (ref 3.5–5.1)
Sodium: 139 mEq/L (ref 135–145)

## 2020-01-31 LAB — BRAIN NATRIURETIC PEPTIDE: Pro B Natriuretic peptide (BNP): 1786 pg/mL — ABNORMAL HIGH (ref 0.0–100.0)

## 2020-01-31 LAB — D-DIMER, QUANTITATIVE: D-Dimer, Quant: 1.01 mcg/mL FEU — ABNORMAL HIGH (ref <0.50)

## 2020-01-31 MED ORDER — BENZONATATE 100 MG PO CAPS: 100.0000 mg | ORAL_CAPSULE | Freq: Four times a day (QID) | ORAL | 1 refills | Status: DC | PRN

## 2020-01-31 MED ORDER — BREZTRI AEROSPHERE 160-9-4.8 MCG/ACT IN AERO: 2.0000 | INHALATION_SPRAY | Freq: Two times a day (BID) | RESPIRATORY_TRACT | 0 refills | Status: AC

## 2020-01-31 NOTE — Assessment & Plan Note (Signed)
CAD/ CM with EF 40-45%. Now with increased peripheral edema. I have asked him to increase his lasix and f/u with cardiology.

## 2020-01-31 NOTE — Progress Notes (Signed)
HPI M never smoker followed for Dyspnea, OSA, complicagted by morbid obesity, CAD/Hx MI  NPSG 04/27/05 Moderate OSA, AHI 16/ hr, weight 290 lbs PFT-07/24/2011-normal spirometry flows within significant response to bronchodilator. Diffusion mildly reduced. FEV1/FVC 0.77. 6 Minute Walk Test- 96%, 88%, 98% 432 meters. Note exertional desaturation.   --------------------------------------------------------------------------------------------------   10/03/19- 74 yo M never smoker followed for Dyspnea, OSA, complicated by morbid obesity, CAD/Hx MI/ CM , aortic stenosis, DM 2, HBP,  CPAP auto 10-20/-new machine 2018/ [employer] Download compliance 100%, AHI 17.7/ hr Body weight today 280 lbs -----f/u OSA. Breathing is at his baseline.  Minimizes cough and wheeze. Couldn't tell that sample Symbicort did much. Doing well with CPAP- download reviewed.   01/31/20- 80 yo M never smoker (uses snuff) followed for Dyspnea, OSA, complicated by morbid obesity, CAD/Hx MI/ CM , aortic stenosis, DM 2, HBP,  CPAP auto 10-20/-new machine 2018/ [employer] Download compliance 87%, AHI 34.8/ hr mostly centrals Symbicort 160,  -----COPD exac, OSA on CPAP More cough and SOB interfere w sleep x 3 weeks He is doing fine with CPAP, unchanged, but not able to sleep lying in bed or recliner because of dry hacking cough and increased SOB over last 3 weeks. This began some time after he got samples of Breztri, which he uses inconsistently because he can't tell it helps. No recent change in cardiac meds. He has developed tight edema in both lower legs. Previously cardiology has recommended he double lasix to 40 mg bid for a few days when this happens, but he hasn't tried that this time.  ECHO 01/17/20- PAH,  Moderate AS, EF 40-45% CT chest 11/11/19- minimal scarring, extensive ASCVD IMPRESSION: 1.  No evidence of intrathoracic mass or other acute abnormality. 2.  Cardiomegaly and coronary artery disease. 3. Aortic Atherosclerosis  (ICD10-I70.0). Aortic valve calcifications.  ROS-see HPI   + = positive Constitutional:   No-   weight loss, night sweats, fevers, chills, fatigue, lassitude. HEENT:   No-  headaches, difficulty swallowing, tooth/dental problems, sore throat,       No-  sneezing, itching, ear ache, nasal congestion, +post nasal drip,  CV:  No-   chest pain, orthopnea, PND, +swelling in lower extremities, anasarca, dizziness, palpitations Resp: + shortness of breath with exertion, + at rest.              No-   productive cough,   non-productive cough,  No- coughing up of blood.              No-   change in color of mucus.  No- wheezing.   Skin: No-   rash or lesions. GI:  No-   heartburn, indigestion, abdominal pain, nausea, vomiting,  GU: MS:  +  joint pain or swelling.   Neuro-     nothing unusual Psych:  No- change in mood or affect. No depression or anxiety.  No memory loss.  OBJ- General- Alert, Oriented, Affect-appropriate, Distress- none acute, + morbid obesity Skin- rash-none, lesions- none, excoriation+ arms( son's puppy) Lymphadenopathy- none Head- atraumatic            Eyes- Gross vision intact, PERRLA, conjunctivae clear secretions            Ears- Hearing, canals-normal            Nose- Clear, no-Septal dev, mucus, polyps, erosion, perforation             Throat- Mallampati IV , mucosa clear , drainage- none, tonsils- atrophic Neck- flexible ,  trachea midline, no stridor , thyroid nl, carotid no bruit Chest - symmetrical excursion , unlabored           Heart/CV- RRR , + 2/6 Systolic murmur , no gallop  , no rub, nl s1 s2                           - JVD- none , edema tight 3-4+, stasis changes- , varices- none           Lung- +few crackles in bases., wheeze- none, cough- none , dullness-none, rub- none           Chest wall-  Abd-  Br/ Gen/ Rectal- Not done, not indicated Extrem- cyanosis- none, clubbing, none, atrophy- none, strength- nl Neuro- grossly intact to observation

## 2020-01-31 NOTE — Assessment & Plan Note (Addendum)
Association with his tight peripheral edema and known heart disease makes me suspect cough and dypnea reflect some degree of left heart failure, but lungs are clear to exam. Plan- double lasix to 40 bid x few days, CXR, overnight oximetry on CPAP, benzonatate perles, BMET, Dd-dimer, BNP

## 2020-01-31 NOTE — Assessment & Plan Note (Signed)
Continues to benefit with good compliance.

## 2020-01-31 NOTE — Patient Instructions (Addendum)
Order- lab- D-dimer, BNP, BMET,    Dx Dyspnea on exertion, peripheral edema  Order- CXR   Dx Dyspnea on exertion  Order- overnight oximetry   Dx Dyspnea on exertion  Double up your lasix for a couple of days, as cardiology has had you do before  Script sent for benzonatate perles for cough

## 2020-02-01 ENCOUNTER — Telehealth: Payer: Self-pay | Admitting: Cardiology

## 2020-02-01 ENCOUNTER — Telehealth: Payer: Self-pay | Admitting: Internal Medicine

## 2020-02-01 DIAGNOSIS — G4734 Idiopathic sleep related nonobstructive alveolar hypoventilation: Secondary | ICD-10-CM

## 2020-02-01 DIAGNOSIS — G4733 Obstructive sleep apnea (adult) (pediatric): Secondary | ICD-10-CM

## 2020-02-01 MED ORDER — FUROSEMIDE 80 MG PO TABS: 80.0000 mg | ORAL_TABLET | Freq: Every day | ORAL | 0 refills | Status: DC

## 2020-02-01 NOTE — Telephone Encounter (Signed)
Dr. [name] would you like the ONO to be on room air or with cpap on?   Please advise

## 2020-02-01 NOTE — Telephone Encounter (Signed)
Wearing CPAP please

## 2020-02-01 NOTE — Telephone Encounter (Signed)
Pt c/o swelling: STAT is pt has developed SOB within 24 hours  1) How much weight have you gained and in what time span? Fluctuating, wife states it's been 8-10lbs in last couple of weeks.   2) If swelling, where is the swelling located? Both legs, knees and below. His left leg is worse than right.   3) Are you currently taking a fluid pill? Yes, furosemide (LASIX) 20 MG tablet. His pulmonologist advised him to double up on it at [facility] visit.   4) Are you currently SOB? Wife advised that the SOB comes and goes, not constant. DOE. When laying down at night, patient can't breathe. Has had to sleep upright to get any sleep. Wife states patient wears CPAP at night, but it is not helping.   5) Do you have a log of your daily weights (if so, list)? No a list. 289 yesterday but it had been 272.   6) Have you gained 3 pounds in a day or 5 pounds in a week? No  7) Have you traveled recently? No

## 2020-02-01 NOTE — Telephone Encounter (Signed)
Recommend taking Lasix 80 mg twice a day.  Have him follow-up with Mickel Baas in about 2 weeks and check another basic metabolic profile.  Continue with fluid restrictions.  Thank you.  Candee Furbish, MD

## 2020-02-01 NOTE — Telephone Encounter (Signed)
Called and spoke to the patient. He states that he has had increased swelling in his legs that go from his feet to his knees, with right greater than left. Patient is not anticoagulated. He states that he has had intermittent SOB with activity and at rest. He states that he cannot lie flat at night. Patient states that he weighs himself everyday but does not keep a daily log and weighs at different times. He states that his weight yesterday was 278 lbs. He states that his baseline is 269-270 lbs. Patient takes lasix 40 mg BID, but took 60 mg BID on Monday and Tuesday without much change.   Patient educated on weighing at the same time everyday with the same amount of clothes on and keeping a log. Instructed the patient to continue lasix 60 mg BID for now and elevate his legs an wear compression stockings to help with swelling. Instructed the patient to avoid salty food.   Patient was seen by pulmonology yesterday and and labs were ordered showing normal Cr, elevated BNP, and slightly elevated D-Dimer. The patient has not received any instruction from pulmonology regarding his labs. I have instructed the patient to call them when we hang up for recommendations as they were the ones who ordered.   Will forward this information to Dr. Marlou Porch as well for review and recommendation regarding diuretic dose and also to review echo results. Paul Kicks, NP had sent a message with questions regarding his echo result. See below:   Paul Serge, NP  01/17/2020 4:28 PM EDT     Dr. Marlou Porch so Paul Bryant with hx of CM now has mildly lower EF - he could not afford entresto. And his aorta is stable. He had mild moderate AS on last echo. Any other recommendations?

## 2020-02-01 NOTE — Telephone Encounter (Signed)
Order has been placed.

## 2020-02-01 NOTE — Telephone Encounter (Signed)
Called and spoke to Dr. Marlou Porch and made him aware if elevated D-Dimer results from pulm and echo question from Cecilie Kicks, NP. Per Dr. Marlou Porch, no changes needed. Cecilie Kicks, NP is not back in the office until the end of August. Will schedule with Truitt Merle, NP instead.  Called and spoke to the patient's wife (DPR on file). Made her aware of Dr. Marlou Porch' recommendations to increase lasix to 80 mg BID. Instructed for patient to avoid salt, restrict fluids, and elevate legs to assist with swelling. Made her aware of echo results and that there were no new recommendations. Appointment made with Truitt Merle, NP on 7/21 and will plan to repeat BMET at that time. Instructed for patient to call back if Sx changed or worsened. She verbalized understanding and thanked me for the call.

## 2020-02-02 ENCOUNTER — Encounter: Payer: Self-pay | Admitting: Internal Medicine

## 2020-02-06 ENCOUNTER — Other Ambulatory Visit: Payer: Self-pay | Admitting: Cardiology

## 2020-02-07 ENCOUNTER — Ambulatory Visit: Payer: Medicare Other | Admitting: Primary Care

## 2020-02-07 NOTE — Progress Notes (Signed)
CARDIOLOGY OFFICE NOTE  Date:  02/15/2020    [name] Date of Birth: 02/09/1946 Medical Record #[id number]  PCP:  Jonathon Jordan, MD  Cardiologist:  [facility]    Chief Complaint  Patient presents with  . Edema    Seen for Dr. Marlou Porch    History of Present Illness: Paul Bryant is a 74 y.o. male who presents today for a work in visit/4 month check. Seen for Dr. Marlou Porch.   He has a history of known CAD with prior MI at age 58 - originally treated by Dr. Melvern Banker with POBA and then CABG at age 87. Other issues include HTN, HLD, PAD with carotid disease and obesity. Last cath in 2019 - to manage medically - felt to be high risk to try PCI. Weight loss as seemed to be his primary challenge. 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   Last seen by a telehealth visit by Mickel Baas back in March. Last saw Dr. Marlou Porch in October of 2020.  Most recent Echo with improvement in EF to 50%.   Comes in today. Here with his wife. Called earlier this month with more swelling despite increased diuretics. Thus added on for today. She has wanted him to go to the ER and he has refused. He has had to sleep sitting up. Can't elevate legs on the sofa because thehe could not breath. Has seen pulmonary for his COPD. BNP was pretty high. Has had blistering of his legs. Remains on 80 mg of Lasix - he has been doing this just once a day. Weight at home today was in the 250's. He does see fluctuations in his weight at home. No excessive salt use. Not really dizzy or lightheaded but she says he has been. He has fallen twice in the last few weeks - did not get hurt. He thinks he might have tripped up. No syncope noted. Coughing - this comes and goes. Clear sputum at times. No chest pain - typically had more arm pain with his heart attack - he has not had this. Does have PND. Using his CPAP regularly.  She notes that he was "just horrible" a few weeks ago - wanted him to go the hospital - she is frustrated that no one  could see him. PCP was out for a month as well. He was just put on Aldactone 25 mg BID a few days ago.   Past Medical History:  Diagnosis Date  . CAD (coronary artery disease)   . Carotid artery occlusion   . COPD (chronic obstructive pulmonary disease) (HCC)    mild  . Diverticulitis oct. 2011  . Dizziness   . Gout   . Heart attack ([place])   . Hernia    umbilical and ventral  . Hyperlipidemia   . Hypertension   . IBS (irritable bowel syndrome)   . Neuropathy   . Obesity, morbid ([place])   . OSA on CPAP   . RBBB (right bundle branch block with left anterior fascicular block)   . S/P cardiac catheterization 04/10/2005   ,  2019 with occluded grafts, LIMA is patent  . Sleep apnea   . Urolithiasis     Past Surgical History:  Procedure Laterality Date  . lap sigmoid colectomy due to persistent    . LEFT HEART CATH AND CORS/GRAFTS ANGIOGRAPHY N/A 08/25/2017   Procedure: LEFT HEART CATH AND CORS/GRAFTS ANGIOGRAPHY;  Surgeon: Belva Crome, MD;  Location: [facility];  Service: Cardiovascular;  Laterality: N/A;  . open heart bypass  aug. 1998  . quadruple bypass    . UMBILICAL HERNIA REPAIR     right proctoscopy  . URETERAL STENT PLACEMENT  09-18-2010   Dr Diona Fanti     Medications: Current Meds  Medication Sig  . aspirin 81 MG tablet Take 81 mg by mouth daily.    . benzonatate (TESSALON) 100 MG capsule Take 1 capsule (100 mg total) by mouth every 6 (six) hours as needed for cough.  . bisoprolol (ZEBETA) 10 MG tablet TAKE 1 TABLET(10 MG) BY MOUTH TWICE DAILY  . Budeson-Glycopyrrol-Formoterol (BREZTRI AEROSPHERE) 160-9-4.8 MCG/ACT AERO Inhale 2 puffs into the lungs 2 (two) times daily.  . canagliflozin (INVOKANA) 100 MG TABS tablet Take 100 mg by mouth daily.  [name] doxazosin (CARDURA) 1 MG tablet Take by mouth 2 (two) times daily.   . furosemide (LASIX) 80 MG tablet Take 1 tablet (80 mg total) by mouth daily.  [name] HYDROcodone-acetaminophen (NORCO) 10-325 MG tablet   .  nitroGLYCERIN (NITROSTAT) 0.4 MG SL tablet Place 1 tablet (0.4 mg total) under the tongue every 5 (five) minutes as needed for chest pain (max 3 doses).  . rosuvastatin (CRESTOR) 40 MG tablet TAKE 1 TABLET EVERY DAY  . sacubitril-valsartan (ENTRESTO) 49-51 MG Take 1 tablet by mouth 2 (two) times daily.  [name] spironolactone (ALDACTONE) 25 MG tablet 25 mg daily.     Allergies: Allergies  Allergen Reactions  . Penicillins Hives, Rash and Other (See Comments)    Has patient had a PCN reaction causing immediate rash, facial/tongue/throat swelling, SOB or lightheadedness with hypotension: Unknown Has patient had a PCN reaction causing severe rash involving mucus membranes or skin necrosis: No Has patient had a PCN reaction that required hospitalization: No Has patient had a PCN reaction occurring within the last 10 years: No If all of the above answers are "NO", then may proceed with Cephalosporin use.     Social History: The patient  reports that he has never smoked. His smokeless tobacco use includes snuff. He reports current alcohol use. He reports that he does not use drugs.   Family History: The patient's family history includes Heart attack in his father; Heart disease in his father; Throat cancer in his mother.   Review of Systems: Please see the history of present illness.   All other systems are reviewed and negative.   Physical Exam: VS:  BP 106/76   Pulse 78   Ht 6' (1.829 m)   Wt 268 lb 12.8 oz (121.9 kg)   SpO2 97%   BMI 36.46 kg/m  .  BMI Body mass index is 36.46 kg/m.  Wt Readings from Last 3 Encounters:  02/15/20 268 lb 12.8 oz (121.9 kg)  01/31/20 289 lb 6.4 oz (131.3 kg)  10/25/19 280 lb (127 kg)    General: Pleasant. Alert and in no acute distress.  His weight is actually down from 281 back in October. He looks chronically ill. Color sallow to me.  Neck: Supple, no JVD that I appreciate.  Cardiac: Regular rate and rhythm. Soft outflow murmur heard thru out the  precordium. At least 2+ edema. One blister noted. Brawny stasis changes.  Respiratory:  Lungs are clear to auscultation bilaterally with normal work of breathing.  GI: Obese but soft.  MS: No deformity or atrophy. Gait and ROM intact.  Skin: Warm and dry. Color is normal.  Neuro:  Strength and sensation are intact and no gross focal deficits noted.  Psych: Alert,  appropriate and with normal affect.   LABORATORY DATA:  EKG:  EKG is not ordered today.    Lab Results  Component Value Date   WBC 8.2 11/05/2017   HGB 14.8 11/05/2017   HCT 42.3 11/05/2017   PLT 147 (L) 11/05/2017   GLUCOSE 153 (H) 01/31/2020   ALT 41 09/10/2010   AST 22 09/10/2010   NA 139 01/31/2020   K 4.7 01/31/2020   CL 103 01/31/2020   CREATININE 0.88 01/31/2020   BUN 22 01/31/2020   CO2 28 01/31/2020   INR 1.1 08/14/2017       BNP (last 3 results) No results for input(s): BNP in the last 8760 hours.  ProBNP (last 3 results) Recent Labs    01/31/20 1228  PROBNP 1,786.0*     Other Studies Reviewed Today:  ECHO IMPRESSIONS 12/2019 Sonographer Comments: Patient is morbidly obese and Technically difficult  study due to poor echo windows. Image acquisition challenging due to COPD  and Image acquisition challenging due to patient body habitus.   1. Akinesis of the inferolateral wall with overall mild to moderate LV  dysfunction; moderate LVE and LVH; moderately dilated ascending aorta (4.6  cm); calcified aortic valve with moderate AS; biatrial enlargement.  2. Left ventricular ejection fraction, by estimation, is 40 to 45%. The  left ventricle has mildly decreased function. The left ventricle  demonstrates regional wall motion abnormalities (see scoring  diagram/findings for description). The left ventricular  internal cavity size was moderately dilated. There is moderate left  ventricular hypertrophy. Left ventricular diastolic parameters are  indeterminate.  3. Right ventricular systolic  function is normal. The right ventricular  size is normal. There is moderately elevated pulmonary artery systolic  pressure.  4. Left atrial size was severely dilated.  5. Right atrial size was mildly dilated.  6. The mitral valve is normal in structure. Trivial mitral valve  regurgitation. No evidence of mitral stenosis.  7. The aortic valve is tricuspid. Aortic valve regurgitation is mild.  Moderate aortic valve stenosis.  8. Aortic dilatation noted. 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  9. The inferior vena cava is dilated in size with >50% respiratory  variability, suggesting right atrial pressure of 8 mmHg.    Cardiac cath 08/25/17 Conclusion    Severe native vessel coronary disease with multiple 80-95% stenoses throughout the mid and distal RCA. The vessels apparently recanalized since the last catheterization in 2006.  Totally occluded proximal LAD.  Eccentric 90% proximal circumflex stenosis. No obtuse marginal branches arise from the circumflex.  Totally occluded ramus intermedius.  30% mid left main narrowing.  Total occlusion of saphenous vein graft to the PDA.  Diffuse 50-60% stenosis of the distal one half of the saphenous vein graft to the ramus intermedius.  Eccentric 95% stenosis in the sequential SV graft to the first and second diagonal.  Widely patent left internal mammary artery to the LAD.  Decreased LV systolic function with EF 35%, significant elevation in LVEDP, 26 mmHg.  RECOMMENDATIONS:   Severe native and bypass graft coronary disease as noted above. No interventional options exist in the native circulation. The only interventional option in the bypass graft territory is the relatively focal stenosis in the sequential graft to the diagonals. The graft is calcified. The graft is so would have a higher risk of atheroembolic complications.  Uptitrate diuretic therapy.   Consider high risk PCI on the sequential graft to diagonal #1  and #2 versus continued medical therapy.  Catheterization procedure was very complicated due to the patient's obesity, calcified aorta, and difficulty with catheter engagement from both the left radial and femoral.    Coronary Diagrams   Diagnostic Diagram          ASSESSMENT & PLAN:     1. Acute on chronic systolic HF - EF noted to have dropped - does have wall motion abnormality noted - not clear to me if this is new or from remote MI -  last cath from 2019 noted - high risk for PCI - has been managed medically. NYHA III - on good guideline therapy - will cut the Aldactone back to just once a day. Changing the Lasix to Torsemide to see if we can get better gut absorption. Lab today as well. I worry that he is having progressive heart failure. May have to consider Zaroxolyn.   2. Known CAD with prior CABG - cath from 2019 noted - on Imdur.   3. Chronic RBBB and 1st degree AV block.   4. Moderate AS - recent echo is noted.   5. Dilated aorta - he would not be a good surgical candidate. Has good BP control.   6. HTN - BP soft with current regimen - he does not seem symptomatic.   7. HLD - not discussed - he is on statin - LDL at goal.   8. Obesity - his weight is actually down.   Current medicines are reviewed with the patient today.  The patient does not have concerns regarding medicines other than what has been noted above.  The following changes have been made:  See above.  Labs/ tests ordered today include:   No orders of the defined types were placed in this encounter.    Disposition:   FU with me next week - his overall prognosis looks pretty tenuous going forward.    Patient is agreeable to this plan and will call if any problems develop in the interim.   SignedTruitt Merle, NP  02/15/2020 9:49 AM  [facility] [address] [place], [place]   [address] Phone: [phone] Fax: [phone]

## 2020-02-08 DIAGNOSIS — M170 Bilateral primary osteoarthritis of knee: Secondary | ICD-10-CM | POA: Diagnosis not present

## 2020-02-08 DIAGNOSIS — I890 Lymphedema, not elsewhere classified: Secondary | ICD-10-CM | POA: Diagnosis not present

## 2020-02-08 DIAGNOSIS — I5043 Acute on chronic combined systolic (congestive) and diastolic (congestive) heart failure: Secondary | ICD-10-CM | POA: Diagnosis not present

## 2020-02-08 DIAGNOSIS — N401 Enlarged prostate with lower urinary tract symptoms: Secondary | ICD-10-CM | POA: Diagnosis not present

## 2020-02-08 DIAGNOSIS — M48061 Spinal stenosis, lumbar region without neurogenic claudication: Secondary | ICD-10-CM | POA: Diagnosis not present

## 2020-02-15 ENCOUNTER — Encounter: Payer: Self-pay | Admitting: Nurse Practitioner

## 2020-02-15 ENCOUNTER — Ambulatory Visit (INDEPENDENT_AMBULATORY_CARE_PROVIDER_SITE_OTHER): Payer: Medicare Other | Admitting: Nurse Practitioner

## 2020-02-15 ENCOUNTER — Other Ambulatory Visit: Payer: Self-pay

## 2020-02-15 VITALS — BP 106/76 | HR 78 | Ht 72.0 in | Wt 268.8 lb

## 2020-02-15 DIAGNOSIS — I420 Dilated cardiomyopathy: Secondary | ICD-10-CM | POA: Diagnosis not present

## 2020-02-15 DIAGNOSIS — I5023 Acute on chronic systolic (congestive) heart failure: Secondary | ICD-10-CM | POA: Diagnosis not present

## 2020-02-15 DIAGNOSIS — I77810 Thoracic aortic ectasia: Secondary | ICD-10-CM | POA: Diagnosis not present

## 2020-02-15 DIAGNOSIS — I2510 Atherosclerotic heart disease of native coronary artery without angina pectoris: Secondary | ICD-10-CM | POA: Diagnosis not present

## 2020-02-15 MED ORDER — TORSEMIDE 20 MG PO TABS: 40.0000 mg | ORAL_TABLET | Freq: Every day | ORAL | 3 refills | Status: DC

## 2020-02-15 NOTE — Patient Instructions (Addendum)
After Visit Summary:  We will be checking the following labs today - BMET, BNP, CBC, HPF  Medication Instructions:    Continue with your current medicines. BUT  Cut the Aldactone back to just once a day  STOP the Furosemide  START TORSEMIDE 40 mg a day   If you need a refill on your cardiac medications before your next appointment, please call your pharmacy.     Testing/Procedures To Be Arranged:  N/A  Follow-Up:   See me next week with repeat BMET    At [facility], you and your health needs are our priority.  As part of our continuing mission to provide you with exceptional heart care, we have created designated Provider Care Teams.  These Care Teams include your primary Cardiologist (physician) and Advanced Practice Providers (APPs -  Physician Assistants and Nurse Practitioners) who all work together to provide you with the care you need, when you need it.  Special Instructions:  . Stay safe, wash your hands for at least 20 seconds and wear a mask when needed.  . It was good to talk with you today.  . Continue to restrict your salt . Continue to weigh daily   Call the [facility] [place] office at [phone] if you have any questions, problems or concerns.

## 2020-02-15 NOTE — Progress Notes (Signed)
CARDIOLOGY OFFICE NOTE  Date:  02/21/2020    Paul Bryant Date of Birth: Nov 29, 1945 Medical Record # [id number]  PCP:  Jonathon Jordan, MD  Cardiologist:  Marisa Cyphers   Chief Complaint  Patient presents with  . Follow-up    Seen for Dr. Marlou Porch    History of Present Illness: Paul Bryant is a 74 y.o. male who presents today for a one week check. Seen for Dr. Marlou Porch.   He has a history of known CAD with prior MI at age 51 - originally treated by Dr. Melvern Banker with POBA and then CABG at age 28. Other issues include HTN, HLD, PAD with carotid disease and obesity. Last cath in 2019 - to manage medically - felt to be high risk to try PCI. Weight loss has seemed to be his primary challenge. 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   Last seen by a telehealth visit by Mickel Baas back in March. Last saw Dr. Marlou Porch in October of 2020.  Most recent Echo with improvement in EF to 50%.   I saw him as work in last week - had called earlier in the month with swelling despite increased doses of diuretics. Sleeping sitting up. PCP had started Aldactone BID. Had had a few falls. I changed him over to Scnetx and cut the Aldactone back to just once a day. Prognosis looked tenuous to me.   Comes in today. Here with his wife. She is quite frustrated - even tearful today.  His BP has been low - down in the 60's - he has felt bad. He held the Aldactone and Torsemide today and feels better. Swelling has continued to improve. Not able to wear support stockings - too hard to put on. Does sit with legs down. Taking Cardura BID. She is asking why he can't go back to his old medicine (was on Avapro), thought Entresto was the "miracle pill". He feels like his breathing is stable. No real bloating but appetite is down. Still not much activity. Does not sound like they get too much salt. Headed out of town this weekend - that is hard in regards to eating.   Past Medical History:  Diagnosis Date  . CAD  (coronary artery disease)   . Carotid artery occlusion   . COPD (chronic obstructive pulmonary disease) (HCC)    mild  . Diverticulitis oct. 2011  . Dizziness   . Gout   . Heart attack ([place])   . Hernia    umbilical and ventral  . Hyperlipidemia   . Hypertension   . IBS (irritable bowel syndrome)   . Neuropathy   . Obesity, morbid ([place])   . OSA on CPAP   . RBBB (right bundle branch block with left anterior fascicular block)   . S/P cardiac catheterization 04/10/2005   ,  2019 with occluded grafts, LIMA is patent  . Sleep apnea   . Urolithiasis     Past Surgical History:  Procedure Laterality Date  . lap sigmoid colectomy due to persistent    . LEFT HEART CATH AND CORS/GRAFTS ANGIOGRAPHY N/A 08/25/2017   Procedure: LEFT HEART CATH AND CORS/GRAFTS ANGIOGRAPHY;  Surgeon: Belva Crome, MD;  Location: [facility];  Service: Cardiovascular;  Laterality: N/A;  . open heart bypass  aug. 1998  . quadruple bypass    . UMBILICAL HERNIA REPAIR     right proctoscopy  . URETERAL STENT PLACEMENT  09-18-2010   Dr Diona Fanti  Medications: Current Meds  Medication Sig  . aspirin 81 MG tablet Take 81 mg by mouth daily.    . benzonatate (TESSALON) 100 MG capsule Take 1 capsule (100 mg total) by mouth every 6 (six) hours as needed for cough.  . bisoprolol (ZEBETA) 10 MG tablet TAKE 1 TABLET(10 MG) BY MOUTH TWICE DAILY  . Budeson-Glycopyrrol-Formoterol (BREZTRI AEROSPHERE) 160-9-4.8 MCG/ACT AERO Inhale 2 puffs into the lungs 2 (two) times daily.  . canagliflozin (INVOKANA) 100 MG TABS tablet Take 100 mg by mouth daily.  [name] doxazosin (CARDURA) 1 MG tablet Take 1 mg by mouth at bedtime.  [name] HYDROcodone-acetaminophen (NORCO) 10-325 MG tablet   . nitroGLYCERIN (NITROSTAT) 0.4 MG SL tablet Place 1 tablet (0.4 mg total) under the tongue every 5 (five) minutes as needed for chest pain (max 3 doses).  . rosuvastatin (CRESTOR) 40 MG tablet TAKE 1 TABLET EVERY DAY  . sacubitril-valsartan  (ENTRESTO) 49-51 MG Take 1 tablet by mouth 2 (two) times daily.  [name] torsemide (DEMADEX) 20 MG tablet Take 1 tablet (20 mg total) by mouth once for 1 dose.  . [DISCONTINUED] spironolactone (ALDACTONE) 25 MG tablet Take 25 mg by mouth 2 (two) times daily.  . [DISCONTINUED] torsemide (DEMADEX) 20 MG tablet Take 2 tablets (40 mg total) by mouth daily.     Allergies: Allergies  Allergen Reactions  . Penicillins Hives, Rash and Other (See Comments)    Has patient had a PCN reaction causing immediate rash, facial/tongue/throat swelling, SOB or lightheadedness with hypotension: Unknown Has patient had a PCN reaction causing severe rash involving mucus membranes or skin necrosis: No Has patient had a PCN reaction that required hospitalization: No Has patient had a PCN reaction occurring within the last 10 years: No If all of the above answers are "NO", then may proceed with Cephalosporin use.     Social History: The patient  reports that he has never smoked. His smokeless tobacco use includes snuff. He reports current alcohol use. He reports that he does not use drugs.   Family History: The patient's family history includes Heart attack in his father; Heart disease in his father; Throat cancer in his mother.   Review of Systems: Please see the history of present illness.   All other systems are reviewed and negative.   Physical Exam: VS:  BP 110/76   Pulse 84   Wt (!) 267 lb 3.2 oz (121.2 kg)   SpO2 96%   BMI 36.24 kg/m  .  BMI Body mass index is 36.24 kg/m.  Wt Readings from Last 3 Encounters:  02/21/20 (!) 267 lb 3.2 oz (121.2 kg)  02/15/20 268 lb 12.8 oz (121.9 kg)  01/31/20 289 lb 6.4 oz (131.3 kg)    General: Pleasant. He looks chronically ill to me. Color is sallow. His spirit is good. He is in no acute distress.   Cardiac: Regular rate and rhythm. Outflow murmur noted. Less edema - worse on the right from prior vein harvesting.  Respiratory:  Lungs are clear to auscultation  bilaterally with normal work of breathing.  GI: Soft and nontender.  MS: No deformity or atrophy. Gait and ROM intact.  Skin: Warm and dry. Color is sallow.  Neuro:  Strength and sensation are intact and no gross focal deficits noted.  Psych: Alert, appropriate and with normal affect.   LABORATORY DATA:  EKG:  EKG is not ordered today.    Lab Results  Component Value Date   WBC 8.5 02/15/2020   HGB 15.2  02/15/2020   HCT 44.1 02/15/2020   PLT 154 02/15/2020   GLUCOSE 178 (H) 02/15/2020   ALT 71 (H) 02/15/2020   AST 38 02/15/2020   NA 137 02/15/2020   K 4.7 02/15/2020   CL 97 02/15/2020   CREATININE 1.00 02/15/2020   BUN 24 02/15/2020   CO2 25 02/15/2020   INR 1.1 08/14/2017       BNP (last 3 results) No results for input(s): BNP in the last 8760 hours.  ProBNP (last 3 results) Recent Labs    01/31/20 1228 02/15/20 1000  PROBNP 1,786.0* 1,682*     Other Studies Reviewed Today:  ECHO IMPRESSIONS 12/2019 Sonographer Comments: Patient is morbidly obese and Technically difficult  study due to poor echo windows. Image acquisition challenging due to COPD  and Image acquisition challenging due to patient body habitus.   1. Akinesis of the inferolateral wall with overall mild to moderate LV  dysfunction; moderate LVE and LVH; moderately dilated ascending aorta (4.6  cm); calcified aortic valve with moderate AS; biatrial enlargement.  2. Left ventricular ejection fraction, by estimation, is 40 to 45%. The  left ventricle has mildly decreased function. The left ventricle  demonstrates regional wall motion abnormalities (see scoring  diagram/findings for description). The left ventricular  internal cavity size was moderately dilated. There is moderate left  ventricular hypertrophy. Left ventricular diastolic parameters are  indeterminate.  3. Right ventricular systolic function is normal. The right ventricular  size is normal. There is moderately elevated  pulmonary artery systolic  pressure.  4. Left atrial size was severely dilated.  5. Right atrial size was mildly dilated.  6. The mitral valve is normal in structure. Trivial mitral valve  regurgitation. No evidence of mitral stenosis.  7. The aortic valve is tricuspid. Aortic valve regurgitation is mild.  Moderate aortic valve stenosis.  8. Aortic dilatation noted. 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  9. The inferior vena cava is dilated in size with >50% respiratory  variability, suggesting right atrial pressure of 8 mmHg.     Cardiac cath 08/25/17 Conclusion    Severe native vessel coronary disease with multiple 80-95% stenoses throughout the mid and distal RCA. The vessels apparently recanalized since the last catheterization in 2006.  Totally occluded proximal LAD.  Eccentric 90% proximal circumflex stenosis. No obtuse marginal branches arise from the circumflex.  Totally occluded ramus intermedius.  30% mid left main narrowing.  Total occlusion of saphenous vein graft to the PDA.  Diffuse 50-60% stenosis of the distal one half of the saphenous vein graft to the ramus intermedius.  Eccentric 95% stenosis in the sequential SV graft to the first and second diagonal.  Widely patent left internal mammary artery to the LAD.  Decreased LV systolic function with EF 35%, significant elevation in LVEDP, 26 mmHg.  RECOMMENDATIONS:   Severe native and bypass graft coronary disease as noted above. No interventional options exist in the native circulation. The only interventional option in the bypass graft territory is the relatively focal stenosis in the sequential graft to the diagonals. The graft is calcified. The graft is so would have a higher risk of atheroembolic complications.  Uptitrate diuretic therapy.  Consider high risk PCI on the sequential graft to diagonal #1 and #2 versus continued medical  therapy.  Catheterization procedure was very complicated due to the patient's obesity, calcified aorta, and difficulty with catheter engagement from both the left radial and femoral.  Coronary Diagrams   Diagnostic Diagram          ASSESSMENT & PLAN:   1. Chronic systolic HF - recent drop in EF - also with wall motion abnormality - not clear if this is new or from remote MI - last cath from 2019 - high risk for PCI - he has been managed medically. Says he is actually feeling some better except for the low BP. I suspect he is too "dry" - recheck BMET today. Holding Aldactone. Cutting Torsemide to just 20 mg a day and will resume this on Thursday and cutting Cardura back to just 1 mg a day. Overall prognosis looks tenuous to me. Explained that this is a fine balance between volume overload/over diuresis.   2. Known CAD with remote CABG - no chest pain - but with more heart failure.   3. Chronic RBBB and 1st degree AV block  4. Moderate AS on recent echo.  5. Dilated aorta - this will need to be followed - doubt he is a candidate for surgical repair. BP is ok.  6. Obesity - long standing issue.   7. OSA - on CPAP  Current medicines are reviewed with the patient today.  The patient does not have concerns regarding medicines other than what has been noted above.  The following changes have been made:  See above.  Labs/ tests ordered today include:    Orders Placed This Encounter  Procedures  . Basic metabolic panel     Disposition:   FU with me or Mickel Baas in about a month. I think his wife is quite overwhelmed - she is taking care of her 65 year old mother; primary caregiver for him, etc. Frustrating and overwhelming situation.   Patient is agreeable to this plan and will call if any problems develop in the interim.   SignedTruitt Merle, NP  02/21/2020 2:52 PM  [facility] [address] [place],    [address] Phone: [phone] Fax: [phone]

## 2020-02-16 LAB — BASIC METABOLIC PANEL
BUN/Creatinine Ratio: 24 (ref 10–24)
BUN: 24 mg/dL (ref 8–27)
CO2: 25 mmol/L (ref 20–29)
Calcium: 9.8 mg/dL (ref 8.6–10.2)
Chloride: 97 mmol/L (ref 96–106)
Creatinine, Ser: 1 mg/dL (ref 0.76–1.27)
GFR calc Af Amer: 86 mL/min/{1.73_m2} (ref >59)
GFR calc non Af Amer: 74 mL/min/{1.73_m2} (ref >59)
Glucose: 178 mg/dL — ABNORMAL HIGH (ref 65–99)
Potassium: 4.7 mmol/L (ref 3.5–5.2)
Sodium: 137 mmol/L (ref 134–144)

## 2020-02-16 LAB — HEPATIC FUNCTION PANEL
ALT: 71 IU/L — ABNORMAL HIGH (ref 0–44)
AST: 38 IU/L (ref 0–40)
Albumin: 4.5 g/dL (ref 3.7–4.7)
Alkaline Phosphatase: 83 IU/L (ref 48–121)
Bilirubin Total: 2 mg/dL — ABNORMAL HIGH (ref 0.0–1.2)
Bilirubin, Direct: 0.4 mg/dL (ref 0.00–0.40)
Total Protein: 6.4 g/dL (ref 6.0–8.5)

## 2020-02-16 LAB — CBC
Hematocrit: 44.1 % (ref 37.5–51.0)
Hemoglobin: 15.2 g/dL (ref 13.0–17.7)
MCH: 30.5 pg (ref 26.6–33.0)
MCHC: 34.5 g/dL (ref 31.5–35.7)
MCV: 89 fL (ref 79–97)
Platelets: 154 10*3/uL (ref 150–450)
RBC: 4.98 x10E6/uL (ref 4.14–5.80)
RDW: 12.5 % (ref 11.6–15.4)
WBC: 8.5 10*3/uL (ref 3.4–10.8)

## 2020-02-16 LAB — PRO B NATRIURETIC PEPTIDE: NT-Pro BNP: 1682 pg/mL — ABNORMAL HIGH (ref 0–376)

## 2020-02-17 ENCOUNTER — Telehealth: Payer: Self-pay | Admitting: Nurse Practitioner

## 2020-02-17 NOTE — Telephone Encounter (Signed)
Agree - Aldactone was cut back at his visit and he was changed to Torsemide. Have him continue to monitor. Will see back as planned.  Cecille Rubin

## 2020-02-17 NOTE — Telephone Encounter (Signed)
Spoke with the patient's wife and they will continue to monitor. She states that he did end up eating some lunch and he is taking a nap now. Will call back if BP running low again.

## 2020-02-17 NOTE — Telephone Encounter (Signed)
Pt c/o BP issue: STAT if pt c/o blurred vision, one-sided weakness or slurred speech  1. What are your last 5 BP readings?  85/57 HR 58 at 12:15pm.   2. Are you having any other symptoms (ex. Dizziness, headache, blurred vision, passed out)? Weak all over.   3. What is your BP issue? Recent changes to his medications.

## 2020-02-17 NOTE — Telephone Encounter (Signed)
Spoke with the patient's wife who states that patient had some recent changes in medications and since then his BP has been running low. Earlier BP was 85/57 and HR 58. Patient feels weak and a little bit swimmy headed. Denies any dizziness.  His Lasix was stopped and he was started on torsemide 40 mg daily and spironolactone was decreased to 20 mg once daily.  The patient's wife reports that he hasn't been drinking or eating much. I advised that he needs to stay hydrated and have something to eat. He has taken all morning medications.  Patient retook blood pressure at it was 108/58. Patient will continue to monitor and let us know if it drops back down. He has a follow up appointment with Josefa Half on 07/27.

## 2020-02-21 ENCOUNTER — Encounter: Payer: Self-pay | Admitting: Nurse Practitioner

## 2020-02-21 ENCOUNTER — Ambulatory Visit (INDEPENDENT_AMBULATORY_CARE_PROVIDER_SITE_OTHER): Payer: Medicare Other | Admitting: Nurse Practitioner

## 2020-02-21 ENCOUNTER — Other Ambulatory Visit: Payer: Self-pay

## 2020-02-21 DIAGNOSIS — I5023 Acute on chronic systolic (congestive) heart failure: Secondary | ICD-10-CM | POA: Diagnosis not present

## 2020-02-21 DIAGNOSIS — I77810 Thoracic aortic ectasia: Secondary | ICD-10-CM

## 2020-02-21 DIAGNOSIS — I2510 Atherosclerotic heart disease of native coronary artery without angina pectoris: Secondary | ICD-10-CM | POA: Diagnosis not present

## 2020-02-21 DIAGNOSIS — I420 Dilated cardiomyopathy: Secondary | ICD-10-CM | POA: Diagnosis not present

## 2020-02-21 MED ORDER — TORSEMIDE 20 MG PO TABS: 20.0000 mg | ORAL_TABLET | Freq: Once | ORAL | 3 refills | Status: DC

## 2020-02-21 NOTE — Patient Instructions (Addendum)
After Visit Summary:  We will be checking the following labs today - BMET   Medication Instructions:    Continue with your current medicines. BUT  I am cutting the Cardura to just once a day - take at night  STOP the Aldactone   Decrease the Torsemide to 20 mg a day - start this on Thursday   If you need a refill on your cardiac medications before your next appointment, please call your pharmacy.     Testing/Procedures To Be Arranged:  N/A  Follow-Up:   See Mickel Baas in a month    At [facility], you and your health needs are our priority.  As part of our continuing mission to provide you with exceptional heart care, we have created designated Provider Care Teams.  These Care Teams include your primary Cardiologist (physician) and Advanced Practice Providers (APPs -  Physician Assistants and Nurse Practitioners) who all work together to provide you with the care you need, when you need it.  Special Instructions:  . Stay safe, wash your hands for at least 20 seconds and wear a mask when needed.  . It was good to talk with you today.    Call the [place] office at [phone] if you have any questions, problems or concerns.

## 2020-02-22 LAB — BASIC METABOLIC PANEL
BUN/Creatinine Ratio: 32 — ABNORMAL HIGH (ref 10–24)
BUN: 37 mg/dL — ABNORMAL HIGH (ref 8–27)
CO2: 22 mmol/L (ref 20–29)
Calcium: 9.4 mg/dL (ref 8.6–10.2)
Chloride: 101 mmol/L (ref 96–106)
Creatinine, Ser: 1.14 mg/dL (ref 0.76–1.27)
GFR calc Af Amer: 73 mL/min/{1.73_m2} (ref >59)
GFR calc non Af Amer: 63 mL/min/{1.73_m2} (ref >59)
Glucose: 241 mg/dL — ABNORMAL HIGH (ref 65–99)
Potassium: 4.9 mmol/L (ref 3.5–5.2)
Sodium: 140 mmol/L (ref 134–144)

## 2020-03-07 DIAGNOSIS — I890 Lymphedema, not elsewhere classified: Secondary | ICD-10-CM | POA: Diagnosis not present

## 2020-03-07 DIAGNOSIS — I5043 Acute on chronic combined systolic (congestive) and diastolic (congestive) heart failure: Secondary | ICD-10-CM | POA: Diagnosis not present

## 2020-03-07 DIAGNOSIS — E1169 Type 2 diabetes mellitus with other specified complication: Secondary | ICD-10-CM | POA: Diagnosis not present

## 2020-03-07 DIAGNOSIS — I350 Nonrheumatic aortic (valve) stenosis: Secondary | ICD-10-CM | POA: Diagnosis not present

## 2020-03-11 ENCOUNTER — Encounter: Payer: Self-pay | Admitting: Internal Medicine

## 2020-03-13 ENCOUNTER — Other Ambulatory Visit: Payer: Self-pay

## 2020-03-13 ENCOUNTER — Ambulatory Visit (INDEPENDENT_AMBULATORY_CARE_PROVIDER_SITE_OTHER): Payer: Medicare Other | Admitting: Internal Medicine

## 2020-03-13 ENCOUNTER — Encounter: Payer: Self-pay | Admitting: Internal Medicine

## 2020-03-13 VITALS — BP 120/70 | HR 60 | Temp 96.8°F | Ht 72.0 in | Wt 268.4 lb

## 2020-03-13 DIAGNOSIS — G4733 Obstructive sleep apnea (adult) (pediatric): Secondary | ICD-10-CM | POA: Diagnosis not present

## 2020-03-13 DIAGNOSIS — R0681 Apnea, not elsewhere classified: Secondary | ICD-10-CM

## 2020-03-13 DIAGNOSIS — I2510 Atherosclerotic heart disease of native coronary artery without angina pectoris: Secondary | ICD-10-CM | POA: Diagnosis not present

## 2020-03-13 NOTE — Progress Notes (Signed)
HPI M never smoker followed for Dyspnea, OSA, complicagted by morbid obesity, CAD/Hx MI  NPSG 04/27/05 Moderate OSA, AHI 16/ hr, weight 290 lbs PFT-07/24/2011-normal spirometry flows with insignificant response to bronchodilator. Diffusion mildly reduced. FEV1/FVC 0.77. 6 Minute Walk Test- 96%, 88%, 98% 432 meters. Note exertional desaturation.   --------------------------------------------------------------------------------------------------   10/03/19- 74 yo M never smoker followed for Dyspnea, OSA, complicated by morbid obesity, CAD/Hx MI/ CM , aortic stenosis, DM 2, HBP,  CPAP auto 10-20/-new machine 2018/ [employer] Download compliance 100%, AHI 17.7/ hr Body weight today 280 lbs -----f/u OSA. Breathing is at his baseline.  Minimizes cough and wheeze. Couldn't tell that sample Symbicort did much. Doing well with CPAP- download reviewed.   03/13/20- 14 yo M never smoker followed for Dyspnea, OSA, complicated by morbid obesity, CAD/Hx MI/ CM , aortic stenosis, DM 2, HBP,  CPAP auto 10-20/-new machine 2018/ [employer] Download compliance 97%, AHI 35.6/ hr- much leak, mostly centrals Body weight today 268 lbs Breztri  Breathing is much better and he can sleep in bed again since CHF treated. We reviewed his latest download showing mask leak and central apneas. Discussed BiPAP or ASV to address CSA.  Overnight Oximetry on CPAP had shown 33.6 minutes with O2 sat </= 88%, done while struggling with CHF.   ROS-see HPI   + = positive Constitutional:   No-   weight loss, night sweats, fevers, chills, fatigue, lassitude. HEENT:   No-  headaches, difficulty swallowing, tooth/dental problems, sore throat,       No-  sneezing, itching, ear ache, nasal congestion, +post nasal drip,  CV:  No-   chest pain, orthopnea, PND, swelling in lower extremities, anasarca, dizziness, palpitations Resp: + shortness of breath with exertion, + at rest.              No-   productive cough,   non-productive cough,  No-  coughing up of blood.              No-   change in color of mucus.  No- wheezing.   Skin: No-   rash or lesions. GI:  No-   heartburn, indigestion, abdominal pain, nausea, vomiting,  GU: MS:  +  joint pain or swelling.   Neuro-     nothing unusual Psych:  No- change in mood or affect. No depression or anxiety.  No memory loss.  OBJ- General- Alert, Oriented, Affect-appropriate, Distress- none acute, + morbid obesity Skin- rash-none, lesions- none, excoriation+ arms( son's puppy) Lymphadenopathy- none Head- atraumatic            Eyes- Gross vision intact, PERRLA, conjunctivae clear secretions            Ears- Hearing, canals-normal            Nose- Clear, no-Septal dev, mucus, polyps, erosion, perforation             Throat- Mallampati IV , mucosa clear , drainage- none, tonsils- atrophic Neck- flexible , trachea midline, no stridor , thyroid nl, carotid no bruit Chest - symmetrical excursion , unlabored           Heart/CV- RRR , + 2/6 Systolic murmur , no gallop  , no rub, nl s1 s2                           - JVD- none , edema+trace, stasis changes- , varices- none           Lung- +  few rales in bases., wheeze- none, cough- none , dullness-none, rub- none           Chest wall-  Abd-  Br/ Gen/ Rectal- Not done, not indicated Extrem- cyanosis- none, clubbing, none, atrophy- none, strength- nl Neuro- grossly intact to observation

## 2020-03-13 NOTE — Assessment & Plan Note (Signed)
Mask leak, desaturation and a lot of residual central apnea.  Plan- BIPAP/ ASV titration to see if we can better control apneas and improve oxygenation,. May need supplemental O2.

## 2020-03-13 NOTE — Patient Instructions (Addendum)
Order- schedule BIPAP/ ASV titration    Dx  Central Sleep apnea, nocturnal hypoxemia  Please call for results about 2 weeks after sleep study

## 2020-03-13 NOTE — Assessment & Plan Note (Signed)
Cardiology has gotten CHF under better control and he is breathing much better.

## 2020-03-26 ENCOUNTER — Ambulatory Visit (INDEPENDENT_AMBULATORY_CARE_PROVIDER_SITE_OTHER): Payer: Medicare Other | Admitting: Cardiology

## 2020-03-26 ENCOUNTER — Encounter: Payer: Self-pay | Admitting: Cardiology

## 2020-03-26 ENCOUNTER — Other Ambulatory Visit: Payer: Self-pay

## 2020-03-26 VITALS — BP 112/64 | HR 79 | Ht 72.0 in | Wt 268.0 lb

## 2020-03-26 DIAGNOSIS — I440 Atrioventricular block, first degree: Secondary | ICD-10-CM | POA: Diagnosis not present

## 2020-03-26 DIAGNOSIS — I4510 Unspecified right bundle-branch block: Secondary | ICD-10-CM

## 2020-03-26 DIAGNOSIS — Z951 Presence of aortocoronary bypass graft: Secondary | ICD-10-CM

## 2020-03-26 DIAGNOSIS — I77810 Thoracic aortic ectasia: Secondary | ICD-10-CM | POA: Diagnosis not present

## 2020-03-26 DIAGNOSIS — I5023 Acute on chronic systolic (congestive) heart failure: Secondary | ICD-10-CM | POA: Diagnosis not present

## 2020-03-26 DIAGNOSIS — I5022 Chronic systolic (congestive) heart failure: Secondary | ICD-10-CM

## 2020-03-26 DIAGNOSIS — I2510 Atherosclerotic heart disease of native coronary artery without angina pectoris: Secondary | ICD-10-CM

## 2020-03-26 DIAGNOSIS — I420 Dilated cardiomyopathy: Secondary | ICD-10-CM | POA: Diagnosis not present

## 2020-03-26 NOTE — Patient Instructions (Addendum)
Medication Instructions:  Your physician recommends that you continue on your current medications as directed. Please refer to the Current Medication list given to you today.  *If you need a refill on your cardiac medications before your next appointment, please call your pharmacy*   Lab Work: TODAY:  BMET  If you have labs (blood work) drawn today and your tests are completely normal, you will receive your results only by: [name] MyChart Message (if you have MyChart) OR . A paper copy in the mail If you have any lab test that is abnormal or we need to change your treatment, we will call you to review the results.   Testing/Procedures: None ordered   Follow-Up: At [facility], you and your health needs are our priority.  As part of our continuing mission to provide you with exceptional heart care, we have created designated Provider Care Teams.  These Care Teams include your primary Cardiologist (physician) and Advanced Practice Providers (APPs -  Physician Assistants and Nurse Practitioners) who all work together to provide you with the care you need, when you need it.  We recommend signing up for the patient portal called "MyChart".  Sign up information is provided on this After Visit Summary.  MyChart is used to connect with patients for Virtual Visits (Telemedicine).  Patients are able to view lab/test results, encounter notes, upcoming appointments, etc.  Non-urgent messages can be sent to your provider as well.   To learn more about what you can do with MyChart, go to [email or web address].    Your next appointment:   2-3 MONTHS   The format for your next appointment:   In Person  Provider:   You may see Candee Furbish, MD or one of the following Advanced Practice Providers on your designated Care Team:    Truitt Merle, NP  Cecilie Kicks, NP  Kathyrn Drown, NP    Other Instructions

## 2020-03-26 NOTE — Progress Notes (Signed)
Cardiology Office Note   Date:  03/29/2020   ID:  Paul Bryant, DOB 03-Jan-1946, MRN [id number]  PCP:  Jonathon Jordan, MD  Cardiologist:  Dr. Marlou Porch    Chief Complaint  Patient presents with  . Coronary Artery Disease  . Congestive Heart Failure      History of Present Illness: Paul Bryant is a 74 y.o. male who presents for CHF  He has a history of known CAD with prior MI at age 88 - originally treated by Dr. Melvern Banker with POBA and then CABG at age 24. Other issues include HTN, HLD, PAD with carotid disease and obesity. Last cath in 2019 - to manage medically - felt to be high risk to try PCI. Weight loss has seemed to be his primary challenge. 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   Last seen by a telehealth visit by Mickel Baas back in March. Last saw Dr. Marlou Porch in October of 2020. Most recent Echo with improvement in EF to 50%.   I saw him as work in last week - had called earlier in the month with swelling despite increased doses of diuretics. Sleeping sitting up. PCP had started Aldactone BID. Had had a few falls. I changed him over to [facility] and cut the Aldactone back to just once a day. Prognosis looked tenuous to me.   Comes in today. Here with his wife. She is quite frustrated - even tearful today.  His BP has been low - down in the 60's - he has felt bad. He held the Aldactone and Torsemide today and feels better. Swelling has continued to improve. Not able to wear support stockings - too hard to put on. Does sit with legs down. Taking Cardura BID. She is asking why he can't go back to his old medicine (was on Avapro), thought Entresto was the "miracle pill". He feels like his breathing is stable. No real bloating but appetite is down. Still not much activity. Does not sound like they get too much salt. Headed out of town this weekend - that is hard in regards to eating.   Last visit holding aldactone, and decreased torsemide to 20 mg daily, cardura to 1 mg daily.  Mod AS  on echo   dilated aorta  Pro BNP  1682   Today he and his wife present.  Increased stress at home due to his mother in law living with them and she has dementia. Seems tough situation for everyone.  He continues with some lightheadedness but not severe.  We discussed stopping cardura.  On torsemide he takes extra dose if swelling which is about 3 X per week.   His diet is healthy.  Labs for today.   Past Medical History:  Diagnosis Date  . CAD (coronary artery disease)   . Carotid artery occlusion   . COPD (chronic obstructive pulmonary disease) (HCC)    mild  . Diverticulitis oct. 2011  . Dizziness   . Gout   . Heart attack ([place])   . Hernia    umbilical and ventral  . Hyperlipidemia   . Hypertension   . IBS (irritable bowel syndrome)   . Neuropathy   . Obesity, morbid ([place])   . OSA on CPAP   . RBBB (right bundle branch block with left anterior fascicular block)   . S/P cardiac catheterization 04/10/2005   ,  2019 with occluded grafts, LIMA is patent  . Sleep apnea   . Urolithiasis     Past  Surgical History:  Procedure Laterality Date  . lap sigmoid colectomy due to persistent    . LEFT HEART CATH AND CORS/GRAFTS ANGIOGRAPHY N/A 08/25/2017   Procedure: LEFT HEART CATH AND CORS/GRAFTS ANGIOGRAPHY;  Surgeon: Belva Crome, MD;  Location: [facility];  Service: Cardiovascular;  Laterality: N/A;  . open heart bypass  aug. 1998  . quadruple bypass    . UMBILICAL HERNIA REPAIR     right proctoscopy  . URETERAL STENT PLACEMENT  09-18-2010   Dr Diona Fanti     Current Outpatient Medications  Medication Sig Dispense Refill  . aspirin 81 MG tablet Take 81 mg by mouth daily.      . bisoprolol (ZEBETA) 10 MG tablet TAKE 1 TABLET(10 MG) BY MOUTH TWICE DAILY 60 tablet 5  . Budeson-Glycopyrrol-Formoterol (BREZTRI AEROSPHERE) 160-9-4.8 MCG/ACT AERO Inhale 2 puffs into the lungs 2 (two) times daily. 28 g 0  . canagliflozin (INVOKANA) 100 MG TABS tablet Take 100 mg by mouth daily.     [name] doxazosin (CARDURA) 1 MG tablet Take 1 mg by mouth at bedtime.    [name] HYDROcodone-acetaminophen (NORCO) 10-325 MG tablet every 4 (four) hours as needed.     . nitroGLYCERIN (NITROSTAT) 0.4 MG SL tablet Place 1 tablet (0.4 mg total) under the tongue every 5 (five) minutes as needed for chest pain (max 3 doses). 75 tablet 1  . rosuvastatin (CRESTOR) 40 MG tablet TAKE 1 TABLET EVERY DAY 90 tablet 2  . sacubitril-valsartan (ENTRESTO) 49-51 MG Take 1 tablet by mouth 2 (two) times daily. 180 tablet 3  . torsemide (DEMADEX) 20 MG tablet Take 20 mg by mouth daily.     No current facility-administered medications for this visit.    Allergies:   Penicillins    Social History:  The patient  reports that he has never smoked. His smokeless tobacco use includes snuff. He reports current alcohol use. He reports that he does not use drugs.   Family History:  The patient's family history includes Heart attack in his father; Heart disease in his father; Throat cancer in his mother.    ROS:  General:no colds or fevers, no weight changes Skin:no rashes or ulcers HEENT:no blurred vision, no congestion CV:see HPI PUL:see HPI GI:no diarrhea constipation or melena, no indigestion GU:no hematuria, no dysuria MS:no joint pain, no claudication Neuro:no syncope, no lightheadedness Endo:no diabetes, no thyroid disease OSA on CPAP  Wt Readings from Last 3 Encounters:  03/26/20 268 lb (121.6 kg)  03/13/20 268 lb 6.4 oz (121.7 kg)  02/21/20 (!) 267 lb 3.2 oz (121.2 kg)     PHYSICAL EXAM: VS:  BP 112/64   Pulse 79   Ht 6' (1.829 m)   Wt 268 lb (121.6 kg)   SpO2 96%   BMI 36.35 kg/m  , BMI Body mass index is 36.35 kg/m. General:Pleasant affect, NAD Skin:Warm and dry, brisk capillary refill HEENT:normocephalic, sclera clear, mucus membranes moist Neck:supple, no JVD, no bruits  Heart:S1S2 RRR with soft systolic murmur, no gallup, rub or click Lungs:clear without rales, rhonchi, or  wheezes WVP:XTGG, non tender, + BS, do not palpate liver spleen or masses Ext:Tr Rt >Lt  lower ext edema, 2+ pedal pulses, 2+ radial pulses Neuro:alert and oriented X 3, MAE, follows commands, + facial symmetry    EKG:  EKG is NOT ordered today.   Recent Labs: 02/15/2020: ALT 71; Hemoglobin 15.2; NT-Pro BNP 1,682; Platelets 154 03/26/2020: BUN 23; Creatinine, Ser 0.97; Potassium 4.1; Sodium 144  Lipid Panel No results found for: CHOL, TRIG, HDL, CHOLHDL, VLDL, LDLCALC, LDLDIRECT     Other studies Reviewed: Additional studies/ records that were reviewed today include:  ECHO IMPRESSIONS 12/2019 Sonographer Comments: Patient is morbidly obese and Technically difficult  study due to poor echo windows. Image acquisition challenging due to COPD  and Image acquisition challenging due to patient body habitus.   1. Akinesis of the inferolateral wall with overall mild to moderate LV  dysfunction; moderate LVE and LVH; moderately dilated ascending aorta (4.6  cm); calcified aortic valve with moderate AS; biatrial enlargement.  2. Left ventricular ejection fraction, by estimation, is 40 to 45%. The  left ventricle has mildly decreased function. The left ventricle  demonstrates regional wall motion abnormalities (see scoring  diagram/findings for description). The left ventricular  internal cavity size was moderately dilated. There is moderate left  ventricular hypertrophy. Left ventricular diastolic parameters are  indeterminate.  3. Right ventricular systolic function is normal. The right ventricular  size is normal. There is moderately elevated pulmonary artery systolic  pressure.  4. Left atrial size was severely dilated.  5. Right atrial size was mildly dilated.  6. The mitral valve is normal in structure. Trivial mitral valve  regurgitation. No evidence of mitral stenosis.  7. The aortic valve is tricuspid. Aortic valve regurgitation is mild.  Moderate aortic valve  stenosis. 8. Aortic dilatation noted. 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  9. The inferior vena cava is dilated in size with >50% respiratory  variability, suggesting right atrial pressure of 8 mmHg.     Cardiac cath 08/25/17 Conclusion    Severe native vessel coronary disease with multiple 80-95% stenoses throughout the mid and distal RCA. The vessels apparently recanalized since the last catheterization in 2006.  Totally occluded proximal LAD.  Eccentric 90% proximal circumflex stenosis. No obtuse marginal branches arise from the circumflex.  Totally occluded ramus intermedius.  30% mid left main narrowing.  Total occlusion of saphenous vein graft to the PDA.  Diffuse 50-60% stenosis of the distal one half of the saphenous vein graft to the ramus intermedius.  Eccentric 95% stenosis in the sequential SV graft to the first and second diagonal.  Widely patent left internal mammary artery to the LAD.  Decreased LV systolic function with EF 35%, significant elevation in LVEDP, 26 mmHg.  RECOMMENDATIONS:   Severe native and bypass graft coronary disease as noted above. No interventional options exist in the native circulation. The only interventional option in the bypass graft territory is the relatively focal stenosis in the sequential graft to the diagonals. The graft is calcified. The graft is so would have a higher risk of atheroembolic complications.  Uptitrate diuretic therapy.  Consider high risk PCI on the sequential graft to diagonal #1 and #2 versus continued medical therapy.  Catheterization procedure was very complicated due to the patient's obesity, calcified aorta, and difficulty with catheter engagement from both the left radial and femoral.    Coronary Diagrams   Diagnostic Diagram          .   ASSESSMENT AND PLAN:  1.  Chronic systolic HF, with drop in EF last cath  2019, high risk for PCI.   meds adjusted due to lower BP.  Decreased torsemide and he is taking 2nd dose 3-4 times per week. But overall feels much better.  Labs to be checked today. He feels more stable today.  He and wife  still frustrated about condition.  2.  CAD with remote CABG, no angina last cath 2019  3.  Chronic RBBB AND 1ST DEGREE av BLOCK.   4.  Moderate AS on recent Echo  5. Dilated aorta , follow on echo   6. Obesity has tried for some time to lose wt.  7.  He has been offered cardiac rehab.  He is not interested currently.   8.  OSA on CPAP  Follow up with Dr. Marlou Porch in 2-3 months  But to call and ask for Triage if increased SOB   Current medicines are reviewed with the patient today.  The patient Has no concerns regarding medicines.  The following changes have been made:  See above Labs/ tests ordered today include:see above  Disposition:   FU:  see above  Signed, Cecilie Kicks, NP  03/29/2020 9:45 PM    [address], [place] [place], [place] Phone: [phone]; Fax: [phone]

## 2020-03-27 ENCOUNTER — Other Ambulatory Visit (HOSPITAL_COMMUNITY): Payer: Medicare Other

## 2020-03-27 LAB — BASIC METABOLIC PANEL
BUN/Creatinine Ratio: 24 (ref 10–24)
BUN: 23 mg/dL (ref 8–27)
CO2: 33 mmol/L — ABNORMAL HIGH (ref 20–29)
Calcium: 10.3 mg/dL — ABNORMAL HIGH (ref 8.6–10.2)
Chloride: 102 mmol/L (ref 96–106)
Creatinine, Ser: 0.97 mg/dL (ref 0.76–1.27)
GFR calc Af Amer: 89 mL/min/{1.73_m2} (ref >59)
GFR calc non Af Amer: 77 mL/min/{1.73_m2} (ref >59)
Glucose: 231 mg/dL — ABNORMAL HIGH (ref 65–99)
Potassium: 4.1 mmol/L (ref 3.5–5.2)
Sodium: 144 mmol/L (ref 134–144)

## 2020-04-19 ENCOUNTER — Ambulatory Visit (HOSPITAL_BASED_OUTPATIENT_CLINIC_OR_DEPARTMENT_OTHER): Payer: Medicare Other | Attending: Internal Medicine | Admitting: Internal Medicine

## 2020-04-19 ENCOUNTER — Other Ambulatory Visit: Payer: Self-pay

## 2020-04-19 VITALS — Ht 72.0 in | Wt 258.0 lb

## 2020-04-19 DIAGNOSIS — G4733 Obstructive sleep apnea (adult) (pediatric): Secondary | ICD-10-CM | POA: Diagnosis not present

## 2020-04-19 DIAGNOSIS — R0681 Apnea, not elsewhere classified: Secondary | ICD-10-CM

## 2020-04-21 DIAGNOSIS — G4733 Obstructive sleep apnea (adult) (pediatric): Secondary | ICD-10-CM

## 2020-04-21 NOTE — Procedures (Signed)
    Patient Name: Paul Bryant, Paul Bryant Date: 04/19/2020 Gender: Male D.O.B: 04-Nov-1945 Age (years): 81 Referring Provider: Baird Lyons MD, ABSM Height (inches): 72 Interpreting Physician: Baird Lyons MD, ABSM Weight (lbs): 258 RPSGT: Zadie Rhine BMI: 35 MRN: [id number] Neck Size: 19.00  CLINICAL INFORMATION The patient is referred for a BiPAP titration to treat sleep apnea.  Date of NPSG, Split Night or HST: NPSG 04/27/05  AHI 54/ hr, desaturation to 86%, body weight 290 lbs  SLEEP STUDY TECHNIQUE As per the AASM Manual for the Scoring of Sleep and Associated Events v2.3 (April 2016) with a hypopnea requiring 4% desaturations.  The channels recorded and monitored were frontal, central and occipital EEG, electrooculogram (EOG), submentalis EMG (chin), nasal and oral airflow, thoracic and abdominal wall motion, anterior tibialis EMG, snore microphone, electrocardiogram, and pulse oximetry. Bilevel positive airway pressure (BPAP) was initiated at the beginning of the study and titrated to treat sleep-disordered breathing.  MEDICATIONS Medications self-administered by patient taken the night of the study : none reported  RESPIRATORY PARAMETERS Optimal IPAP Pressure (cm): 21 AHI at Optimal Pressure (/hr) 0.0 Optimal EPAP Pressure (cm): 17   Overall Minimal O2 (%): 86.0 Minimal O2 at Optimal Pressure (%): 93.0 SLEEP ARCHITECTURE Start Time: 9:51:40 PM Stop Time: 4:17:23 AM Total Time (min): 385.7 Total Sleep Time (min): 266 Sleep Latency (min): 26.7 Sleep Efficiency (%): 69.0% REM Latency (min): 112.5 WASO (min): 93.0 Stage N1 (%): 15.0% Stage N2 (%): 71.4% Stage N3 (%): 0.0% Stage R (%): 13.5 Supine (%): 50.93 Arousal Index (/hr): 25.0   CARDIAC DATA The 2 lead EKG demonstrated sinus rhythm. The mean heart rate was 54.6 beats per minute. Other EKG findings include: None.  LEG MOVEMENT DATA The total Periodic Limb Movements of Sleep (PLMS) were 0. The PLMS index was 0.0. A  PLMS index of <15 is considered normal in adults.  IMPRESSIONS - An optimal PAP pressure was selected for this patient ( 21 /17 cm of water) - Central sleep apnea was not noted during this titration (CAI = 0.2/h). - Moderate oxygen desaturations were observed during this titration (min O2 = 86.0%). Minimum sat on BIPAP 21/17 was 93%. - No snoring was audible during this study. - No cardiac abnormalities were observed during this study. - Clinically significant periodic limb movements were not noted during this study. Arousals associated with PLMs were rare.  DIAGNOSIS - Obstructive Sleep Apnea (G47.33)  RECOMMENDATIONS - Trial of BiPAP therapy on 21/17 cm H2O. Patient used a Small size Resmed Full Face Mask AirFit F20 mask and heated humidification. - Be careful with alcohol, sedatives and other CNS depressants that may worsen sleep apnea and disrupt normal sleep architecture. - Sleep hygiene should be reviewed to assess factors that may improve sleep quality. - Weight management and regular exercise should be initiated or continued.  [Electronically signed] 04/21/2020 03:22 PM  Baird Lyons MD, [place], American Board of Sleep Medicine   NPI: [id number]                         [place], [place] [facility]  ELECTRONICALLY SIGNED ON:  04/21/2020, 3:17 PM [place] PH: [phone]   FX: [phone] [place]

## 2020-04-26 ENCOUNTER — Encounter: Payer: Self-pay | Admitting: Internal Medicine

## 2020-04-26 DIAGNOSIS — Z23 Encounter for immunization: Secondary | ICD-10-CM | POA: Diagnosis not present

## 2020-04-26 NOTE — Progress Notes (Signed)
Sleep study showed good control of the sleep apnea and correction of low oxygen levels with a change from his CPAP to a BIPAP machine.  Order- DME [facility]- Based on new sleep study, please change his CPAP to BIPAP 21/ 17, mask of choice, humidifier, supplies, Airview/ card. Please provide new download in 1 month.

## 2020-04-27 ENCOUNTER — Telehealth: Payer: Self-pay

## 2020-04-27 ENCOUNTER — Other Ambulatory Visit: Payer: Self-pay | Admitting: Internal Medicine

## 2020-04-27 DIAGNOSIS — R0602 Shortness of breath: Secondary | ICD-10-CM

## 2020-04-27 DIAGNOSIS — G4733 Obstructive sleep apnea (adult) (pediatric): Secondary | ICD-10-CM

## 2020-04-27 NOTE — Telephone Encounter (Signed)
Pressure support 4

## 2020-04-29 ENCOUNTER — Other Ambulatory Visit: Payer: Self-pay | Admitting: Cardiology

## 2020-05-30 DIAGNOSIS — Z23 Encounter for immunization: Secondary | ICD-10-CM | POA: Diagnosis not present

## 2020-06-07 DIAGNOSIS — J019 Acute sinusitis, unspecified: Secondary | ICD-10-CM | POA: Diagnosis not present

## 2020-06-11 ENCOUNTER — Encounter: Payer: Self-pay | Admitting: Cardiology

## 2020-06-11 ENCOUNTER — Other Ambulatory Visit: Payer: Self-pay

## 2020-06-11 ENCOUNTER — Ambulatory Visit (INDEPENDENT_AMBULATORY_CARE_PROVIDER_SITE_OTHER): Payer: Medicare Other | Admitting: Cardiology

## 2020-06-11 VITALS — BP 108/62 | HR 75 | Ht 72.0 in | Wt 273.0 lb

## 2020-06-11 DIAGNOSIS — Z951 Presence of aortocoronary bypass graft: Secondary | ICD-10-CM

## 2020-06-11 DIAGNOSIS — I2510 Atherosclerotic heart disease of native coronary artery without angina pectoris: Secondary | ICD-10-CM

## 2020-06-11 DIAGNOSIS — I5022 Chronic systolic (congestive) heart failure: Secondary | ICD-10-CM

## 2020-06-11 NOTE — Progress Notes (Signed)
Cardiology Office Note:    Date:  06/11/2020   ID:  [name], DOB 1945-11-10, MRN [id number]  PCP:  Jonathon Jordan, MD  [facility] Cardiologist:  Candee Furbish, MD  [facility] Electrophysiologist:  None   Referring MD: Jonathon Jordan, MD     History of Present Illness:    Paul Bryant is a 74 y.o. male here for the follow-up of CAD, CABG 83, MI 47 originally treated with p.o. BA.  Last cath 2019-managed medically.  Last visit with Cecilie Kicks reviewed from 03/26/2020-he was quite frustrated and tearful.  Blood pressure had been down the low 60s felt poorly.  He held the Aldactone and torsemide feels better.  Too hard to put on support stockings.  Thought Delene Loll was the miracle pill but he felt so poorly.  Still frustrated with edema, urinary frequency etc.  Continue to encourage movement, exercise, leg elevation, continue use of torsemide.  Past Medical History:  Diagnosis Date   CAD (coronary artery disease)    Carotid artery occlusion    COPD (chronic obstructive pulmonary disease) (HCC)    mild   Diverticulitis oct. 2011   Dizziness    Gout    Heart attack ([place])    Hernia    umbilical and ventral   Hyperlipidemia    Hypertension    IBS (irritable bowel syndrome)    Neuropathy    Obesity, morbid (HCC)    OSA on CPAP    RBBB (right bundle branch block with left anterior fascicular block)    S/P cardiac catheterization 04/10/2005   ,  2019 with occluded grafts, LIMA is patent   Sleep apnea    Urolithiasis     Past Surgical History:  Procedure Laterality Date   lap sigmoid colectomy due to persistent     LEFT HEART CATH AND CORS/GRAFTS ANGIOGRAPHY N/A 08/25/2017   Procedure: LEFT HEART CATH AND CORS/GRAFTS ANGIOGRAPHY;  Surgeon: Belva Crome, MD;  Location: [facility];  Service: Cardiovascular;  Laterality: N/A;   open heart bypass  aug. 1998   quadruple bypass     UMBILICAL HERNIA REPAIR     right  proctoscopy   URETERAL STENT PLACEMENT  09-18-2010   Dr Diona Fanti    Current Medications: Current Meds  Medication Sig   aspirin 81 MG tablet Take 81 mg by mouth daily.     azithromycin (ZITHROMAX) 250 MG tablet Take 250 mg by mouth daily.    bisoprolol (ZEBETA) 10 MG tablet TAKE 1 TABLET(10 MG) BY MOUTH TWICE DAILY   Budeson-Glycopyrrol-Formoterol (BREZTRI AEROSPHERE) 160-9-4.8 MCG/ACT AERO Inhale 2 puffs into the lungs 2 (two) times daily.   canagliflozin (INVOKANA) 100 MG TABS tablet Take 100 mg by mouth daily.   doxazosin (CARDURA) 1 MG tablet Take 1 mg by mouth at bedtime.   nitroGLYCERIN (NITROSTAT) 0.4 MG SL tablet Place 1 tablet (0.4 mg total) under the tongue every 5 (five) minutes as needed for chest pain (max 3 doses).   Promethazine-Codeine 6.25-10 MG/5ML SOLN Take 5 mLs by mouth at bedtime.    rosuvastatin (CRESTOR) 40 MG tablet TAKE 1 TABLET EVERY DAY   sacubitril-valsartan (ENTRESTO) 49-51 MG Take 1 tablet by mouth 2 (two) times daily.   torsemide (DEMADEX) 20 MG tablet Take 20 mg by mouth daily.     Allergies:   Penicillins   Social History   Socioeconomic History   Marital status: Married    Spouse name: Mishon Blubaugh   Number of children: 2  Years of education: Not on file   Highest education level: Not on file  Occupational History   Occupation: city Heritage manager: [employer]  Tobacco Use   Smoking status: Never Smoker   Smokeless tobacco: Current User    Types: Snuff  Vaping Use   Vaping Use: Never used  Substance and Sexual Activity   Alcohol use: Yes    Comment: socially 2-3 cans of beer   Drug use: No   Sexual activity: Not on file  Other Topics Concern   Not on file  Social History Narrative   Pt was adopted; Father was a doctor.   Social Determinants of Health   Financial Resource Strain:    Difficulty of Paying Living Expenses: Not on file  Food Insecurity:    Worried About Charity fundraiser in  the Last Year: Not on file   [employer] of Food in the Last Year: Not on file  Transportation Needs:    Lack of Transportation (Medical): Not on file   Lack of Transportation (Non-Medical): Not on file  Physical Activity:    Days of Exercise per Week: Not on file   Minutes of Exercise per Session: Not on file  Stress:    Feeling of Stress : Not on file  Social Connections:    Frequency of Communication with Friends and Family: Not on file   Frequency of Social Gatherings with Friends and Family: Not on file   Attends Religious Services: Not on file   Active Member of Clubs or Organizations: Not on file   Attends Archivist Meetings: Not on file   Marital Status: Not on file     Family History: The patient's family history includes Heart attack in his father; Heart disease in his father; Throat cancer in his mother. There is no history of Colon cancer or Liver disease.  ROS:   Please see the history of present illness.    Continued edema lower extremities, urinary frequency.  All other systems reviewed and are negative.  EKGs/Labs/Other Studies Reviewed:    The following studies were reviewed today: Echocardiogram 12/2019 -EF 40 to 45% with moderate aortic valve stenosis  Cardiac catheterization 2019 Diagnostic Diagram             Recent Labs: 02/15/2020: ALT 71; Hemoglobin 15.2; NT-Pro BNP 1,682; Platelets 154 03/26/2020: BUN 23; Creatinine, Ser 0.97; Potassium 4.1; Sodium 144  Recent Lipid Panel No results found for: CHOL, TRIG, HDL, CHOLHDL, VLDL, LDLCALC, LDLDIRECT   Risk Assessment/Calculations:       Physical Exam:    VS:  BP 108/62    Pulse 75    Ht 6' (1.829 m)    Wt 273 lb (123.8 kg)    SpO2 98%    BMI 37.03 kg/m     Wt Readings from Last 3 Encounters:  06/11/20 273 lb (123.8 kg)  04/19/20 258 lb (117 kg)  03/26/20 268 lb (121.6 kg)     GEN:  Well nourished, well developed in no acute distress HEENT: Normal NECK: No JVD;  No carotid bruits LYMPHATICS: No lymphadenopathy CARDIAC: RRR, 2/6 SM, no rubs, gallops RESPIRATORY:  Clear to auscultation without rales, wheezing or rhonchi  ABDOMEN: Soft, non-tender, non-distended MUSCULOSKELETAL: Chronic LE 2+ edema with noted skin coloration secondary to chronic venous disease.; No deformity  SKIN: Warm and dry NEUROLOGIC:  Alert and oriented x 3 PSYCHIATRIC:  Normal affect   ASSESSMENT:    1. Coronary artery disease  involving native coronary artery of native heart without angina pectoris   2. Chronic systolic HF (heart failure) ([place])   3. Hx of CABG   4. Morbid obesity due to excess calories (HCC)    PLAN:    In order of problems listed above:  Coronary artery disease with CABG -Last cath 2019 reviewed as above.  No significant anginal symptoms.  Chronic systolic heart failure secondary to ischemic cardiomyopathy -Blood pressure decreased with Entresto use.  He was frustrated by this originally.  Torsemide was decreased.  Blood pressure today 108/62.  At home sometimes in the 130s to 140s.  Continue with current regimen.  He is off of Aldactone.  Moderate aortic stenosis -Continue to repeat yearly.  Next echo in June 2022  Dilated aortic root -Continue to repeat.  June 2022  Lower extremity edema -Chronic multifactorial from cardiomyopathy, morbid obesity, chronic venous insufficiency.  Frequency of urination -Encouraged urology.  LDL 69 hemoglobin A1c 7.1 hemoglobin 15.2 creatinine 0.97 potassium 4.1  Shared Decision Making/Informed Consent        Medication Adjustments/Labs and Tests Ordered: Current medicines are reviewed at length with the patient today.  Concerns regarding medicines are outlined above.  No orders of the defined types were placed in this encounter.  No orders of the defined types were placed in this encounter.   Patient Instructions  Medication Instructions:  The current medical regimen is effective;  continue present  plan and medications.  *If you need a refill on your cardiac medications before your next appointment, please call your pharmacy*  Follow-Up: At [facility], you and your health needs are our priority.  As part of our continuing mission to provide you with exceptional heart care, we have created designated Provider Care Teams.  These Care Teams include your primary Cardiologist (physician) and Advanced Practice Providers (APPs -  Physician Assistants and Nurse Practitioners) who all work together to provide you with the care you need, when you need it.  We recommend signing up for the patient portal called "MyChart".  Sign up information is provided on this After Visit Summary.  MyChart is used to connect with patients for Virtual Visits (Telemedicine).  Patients are able to view lab/test results, encounter notes, upcoming appointments, etc.  Non-urgent messages can be sent to your provider as well.   To learn more about what you can do with MyChart, go to [email or web address].    Your next appointment:   6 month(s)  The format for your next appointment:   In Person  Provider:   Cecilie Kicks, NP   Thank you for choosing [facility]!!        Signed, Candee Furbish, MD  06/11/2020 8:47 AM    [place]

## 2020-06-11 NOTE — Patient Instructions (Signed)
Medication Instructions:  The current medical regimen is effective;  continue present plan and medications.  *If you need a refill on your cardiac medications before your next appointment, please call your pharmacy*  Follow-Up: At [facility], you and your health needs are our priority.  As part of our continuing mission to provide you with exceptional heart care, we have created designated Provider Care Teams.  These Care Teams include your primary Cardiologist (physician) and Advanced Practice Providers (APPs -  Physician Assistants and Nurse Practitioners) who all work together to provide you with the care you need, when you need it.  We recommend signing up for the patient portal called "MyChart".  Sign up information is provided on this After Visit Summary.  MyChart is used to connect with patients for Virtual Visits (Telemedicine).  Patients are able to view lab/test results, encounter notes, upcoming appointments, etc.  Non-urgent messages can be sent to your provider as well.   To learn more about what you can do with MyChart, go to [email or web address].    Your next appointment:   6 month(s)  The format for your next appointment:   In Person  Provider:   Cecilie Kicks, NP   Thank you for choosing [facility]!!

## 2020-06-13 NOTE — Progress Notes (Signed)
HPI M never smoker followed for Dyspnea, OSA, complicagted by morbid obesity, CAD/Hx MI  NPSG 04/27/05 Moderate OSA, AHI 16/ hr, weight 290 lbs PFT-07/24/2011-normal spirometry flows with insignificant response to bronchodilator. Diffusion mildly reduced. FEV1/FVC 0.77. 6 Minute Walk Test- 96%, 88%, 98% 432 meters. Note exertional desaturation.   --------------------------------------------------------------------------------------------------   03/13/20- 74 yo M never smoker followed for Dyspnea, OSA, complicated by morbid obesity, CAD/Hx MI/ CM , aortic stenosis, DM 2, HBP,  CPAP auto 10-20/-new machine 2018/ [employer] Download compliance 97%, AHI 35.6/ hr- much leak, mostly centrals Body weight today 268 lbs Breztri  Breathing is much better and he can sleep in bed again since CHF treated. We reviewed his latest download showing mask leak and central apneas. Discussed BiPAP or ASV to address CSA.  Overnight Oximetry on CPAP had shown 33.6 minutes with O2 sat </= 88%, done while struggling with CHF.   06/14/20- 50 yo M never smoker followed for Dyspnea, OSA, complicated by morbid obesity, CAD/Hx MI/ CM , Aortic Stenosis, DM 2, HTN, LPR,  BiPAP titration 04/21/20-> 21/17 which corrected hypoxemia on room air BIPAP 21/17, PS4/ [employer]       AirCurve VAuto machine Download-  Compliance good last 2 days since AirView installed, AHI 29.5/ hr, + leak Body weight today- 281 lbs Covid vax- 3 Modernahad Flu vax- had ECHO- EF 40-45%, mod AS Just now trying BIPAP based on recent titration. Complains it is blowing too hard, mask leak. I reviewed titration. Not convinced he was well-controlled by end of study. He is frustrated by polyuria/ frequent urination on current diuretics after meds adjusted by cardiology. Cards had suggested he see Urology.  ROS-see HPI   + = positive Constitutional:   No-   weight loss, night sweats, fevers, chills, fatigue, lassitude. HEENT:   No-  headaches, difficulty  swallowing, tooth/dental problems, sore throat,       No-  sneezing, itching, ear ache, nasal congestion, +post nasal drip,  CV:  No-   chest pain, orthopnea, PND, +swelling in lower extremities, anasarca, dizziness, palpitations Resp: + shortness of breath with exertion, + at rest.              No-   productive cough,   non-productive cough,  No- coughing up of blood.              No-   change in color of mucus.  No- wheezing.   Skin: No-   rash or lesions. GI:  No-   heartburn, indigestion, abdominal pain, nausea, vomiting,  GU: + frequency MS:  +  joint pain or swelling.   Neuro-     nothing unusual Psych:  No- change in mood or affect. No depression or anxiety.  No memory loss.  OBJ- General- Alert, Oriented, Affect-appropriate, Distress- none acute, + morbid obesity Skin- rash-none, lesions- none, excoriation+ arms( son's puppy) Lymphadenopathy- none Head- atraumatic            Eyes- Gross vision intact, PERRLA, conjunctivae clear secretions            Ears- Hearing, canals-normal            Nose- Clear, no-Septal dev, mucus, polyps, erosion, perforation             Throat- Mallampati IV , mucosa clear , drainage- none, tonsils- atrophic Neck- flexible , trachea midline, no stridor , thyroid nl, carotid no bruit Chest - symmetrical excursion , unlabored  Heart/CV- RRR , + 2/6 Systolic murmur , no gallop  , no rub, nl s1 s2                           - JVD- none , edema+2, stasis changes- , varices- none           Lung- +few rales in bases., wheeze- none, cough- none , dullness-none, rub- none           Chest wall-  Abd-  Br/ Gen/ Rectal- Not done, not indicated Extrem- cyanosis- none, clubbing, none, atrophy- none, strength- nl Neuro- grossly intact to observation

## 2020-06-14 ENCOUNTER — Other Ambulatory Visit: Payer: Self-pay

## 2020-06-14 ENCOUNTER — Ambulatory Visit (INDEPENDENT_AMBULATORY_CARE_PROVIDER_SITE_OTHER): Payer: Medicare Other | Admitting: Internal Medicine

## 2020-06-14 ENCOUNTER — Encounter: Payer: Self-pay | Admitting: Internal Medicine

## 2020-06-14 VITALS — BP 132/76 | HR 74 | Temp 97.2°F | Ht 72.0 in | Wt 281.4 lb

## 2020-06-14 DIAGNOSIS — G4733 Obstructive sleep apnea (adult) (pediatric): Secondary | ICD-10-CM | POA: Diagnosis not present

## 2020-06-14 DIAGNOSIS — R3915 Urgency of urination: Secondary | ICD-10-CM | POA: Diagnosis not present

## 2020-06-14 DIAGNOSIS — I2510 Atherosclerotic heart disease of native coronary artery without angina pectoris: Secondary | ICD-10-CM

## 2020-06-14 NOTE — Assessment & Plan Note (Signed)
Weight loss remains a long-term goal he hasn't been able to deal with yet.

## 2020-06-14 NOTE — Assessment & Plan Note (Signed)
Obviously urinary urgency mostly reflects his CHF and diuretics. It is distressing to him so we will take cardiology advice and have Urology check for reversible issues. Plan- referral to Urology for urinary urgency

## 2020-06-14 NOTE — Assessment & Plan Note (Signed)
Surprisingly hard to control his obstructive events. My need to accept suboptimal control to use pressures he can tolerate. Did need BIPAP- CPAP inadequate. Plan- try VAuto BIPAP 20/15.

## 2020-06-14 NOTE — Patient Instructions (Addendum)
Order DME [facility]- please change BIPAP to VAuto    Ins  20/15   Exp  20/15   Continue mask of choice, humidifier, supplies, AirView/ card  Order- refer to [facility] dx Urgency  Please call as needed

## 2020-06-19 ENCOUNTER — Other Ambulatory Visit: Payer: Self-pay

## 2020-06-19 MED ORDER — ENTRESTO 49-51 MG PO TABS: 1.0000 | ORAL_TABLET | Freq: Two times a day (BID) | ORAL | 3 refills | Status: AC

## 2020-06-25 ENCOUNTER — Emergency Department (HOSPITAL_COMMUNITY): Payer: Medicare Other

## 2020-06-25 ENCOUNTER — Emergency Department (HOSPITAL_COMMUNITY)
Admission: EM | Admit: 2020-06-25 | Discharge: 2020-06-25 | Disposition: A | Discharge status: Home / Self Care | Payer: Medicare Other | Attending: Emergency Medicine | Admitting: Emergency Medicine

## 2020-06-25 DIAGNOSIS — R410 Disorientation, unspecified: Secondary | ICD-10-CM | POA: Diagnosis not present

## 2020-06-25 DIAGNOSIS — I2510 Atherosclerotic heart disease of native coronary artery without angina pectoris: Secondary | ICD-10-CM | POA: Diagnosis not present

## 2020-06-25 DIAGNOSIS — R404 Transient alteration of awareness: Secondary | ICD-10-CM | POA: Diagnosis not present

## 2020-06-25 DIAGNOSIS — Z9282 Status post administration of tPA (rtPA) in a different facility within the last 24 hours prior to admission to current facility: Secondary | ICD-10-CM | POA: Diagnosis not present

## 2020-06-25 DIAGNOSIS — E876 Hypokalemia: Secondary | ICD-10-CM | POA: Diagnosis not present

## 2020-06-25 DIAGNOSIS — R4182 Altered mental status, unspecified: Secondary | ICD-10-CM | POA: Diagnosis not present

## 2020-06-25 DIAGNOSIS — R778 Other specified abnormalities of plasma proteins: Secondary | ICD-10-CM | POA: Diagnosis not present

## 2020-06-25 DIAGNOSIS — E785 Hyperlipidemia, unspecified: Secondary | ICD-10-CM | POA: Diagnosis not present

## 2020-06-25 DIAGNOSIS — R059 Cough, unspecified: Secondary | ICD-10-CM | POA: Diagnosis not present

## 2020-06-25 DIAGNOSIS — I480 Paroxysmal atrial fibrillation: Secondary | ICD-10-CM | POA: Diagnosis not present

## 2020-06-25 DIAGNOSIS — E8770 Fluid overload, unspecified: Secondary | ICD-10-CM | POA: Diagnosis not present

## 2020-06-25 DIAGNOSIS — R29818 Other symptoms and signs involving the nervous system: Secondary | ICD-10-CM | POA: Diagnosis not present

## 2020-06-25 DIAGNOSIS — I639 Cerebral infarction, unspecified: Secondary | ICD-10-CM | POA: Insufficient documentation

## 2020-06-25 DIAGNOSIS — I5023 Acute on chronic systolic (congestive) heart failure: Secondary | ICD-10-CM | POA: Diagnosis present

## 2020-06-25 DIAGNOSIS — I611 Nontraumatic intracerebral hemorrhage in hemisphere, cortical: Secondary | ICD-10-CM | POA: Diagnosis present

## 2020-06-25 DIAGNOSIS — I714 Abdominal aortic aneurysm, without rupture: Secondary | ICD-10-CM | POA: Diagnosis not present

## 2020-06-25 DIAGNOSIS — I080 Rheumatic disorders of both mitral and aortic valves: Secondary | ICD-10-CM | POA: Diagnosis not present

## 2020-06-25 DIAGNOSIS — I6601 Occlusion and stenosis of right middle cerebral artery: Secondary | ICD-10-CM | POA: Insufficient documentation

## 2020-06-25 DIAGNOSIS — I4510 Unspecified right bundle-branch block: Secondary | ICD-10-CM | POA: Diagnosis not present

## 2020-06-25 DIAGNOSIS — R414 Neurologic neglect syndrome: Secondary | ICD-10-CM | POA: Diagnosis not present

## 2020-06-25 DIAGNOSIS — J9691 Respiratory failure, unspecified with hypoxia: Secondary | ICD-10-CM | POA: Diagnosis not present

## 2020-06-25 DIAGNOSIS — R0600 Dyspnea, unspecified: Secondary | ICD-10-CM | POA: Diagnosis not present

## 2020-06-25 DIAGNOSIS — I651 Occlusion and stenosis of basilar artery: Secondary | ICD-10-CM | POA: Diagnosis not present

## 2020-06-25 DIAGNOSIS — Z20822 Contact with and (suspected) exposure to covid-19: Secondary | ICD-10-CM | POA: Diagnosis not present

## 2020-06-25 DIAGNOSIS — J984 Other disorders of lung: Secondary | ICD-10-CM | POA: Diagnosis not present

## 2020-06-25 DIAGNOSIS — R0602 Shortness of breath: Secondary | ICD-10-CM | POA: Diagnosis not present

## 2020-06-25 DIAGNOSIS — Z4682 Encounter for fitting and adjustment of non-vascular catheter: Secondary | ICD-10-CM | POA: Diagnosis not present

## 2020-06-25 DIAGNOSIS — I248 Other forms of acute ischemic heart disease: Secondary | ICD-10-CM | POA: Diagnosis present

## 2020-06-25 DIAGNOSIS — E668 Other obesity: Secondary | ICD-10-CM | POA: Diagnosis present

## 2020-06-25 DIAGNOSIS — E7800 Pure hypercholesterolemia, unspecified: Secondary | ICD-10-CM | POA: Diagnosis not present

## 2020-06-25 DIAGNOSIS — S0003XA Contusion of scalp, initial encounter: Secondary | ICD-10-CM | POA: Diagnosis not present

## 2020-06-25 DIAGNOSIS — G9340 Encephalopathy, unspecified: Secondary | ICD-10-CM | POA: Diagnosis not present

## 2020-06-25 DIAGNOSIS — J811 Chronic pulmonary edema: Secondary | ICD-10-CM | POA: Diagnosis not present

## 2020-06-25 DIAGNOSIS — N281 Cyst of kidney, acquired: Secondary | ICD-10-CM | POA: Diagnosis not present

## 2020-06-25 DIAGNOSIS — N289 Disorder of kidney and ureter, unspecified: Secondary | ICD-10-CM | POA: Diagnosis not present

## 2020-06-25 DIAGNOSIS — R29713 NIHSS score 13: Secondary | ICD-10-CM | POA: Diagnosis present

## 2020-06-25 DIAGNOSIS — G4730 Sleep apnea, unspecified: Secondary | ICD-10-CM | POA: Diagnosis not present

## 2020-06-25 DIAGNOSIS — Z7982 Long term (current) use of aspirin: Secondary | ICD-10-CM | POA: Diagnosis not present

## 2020-06-25 DIAGNOSIS — R188 Other ascites: Secondary | ICD-10-CM | POA: Diagnosis not present

## 2020-06-25 DIAGNOSIS — Z79899 Other long term (current) drug therapy: Secondary | ICD-10-CM | POA: Insufficient documentation

## 2020-06-25 DIAGNOSIS — F05 Delirium due to known physiological condition: Secondary | ICD-10-CM | POA: Diagnosis not present

## 2020-06-25 DIAGNOSIS — I4891 Unspecified atrial fibrillation: Secondary | ICD-10-CM | POA: Diagnosis not present

## 2020-06-25 DIAGNOSIS — I63511 Cerebral infarction due to unspecified occlusion or stenosis of right middle cerebral artery: Secondary | ICD-10-CM

## 2020-06-25 DIAGNOSIS — E1165 Type 2 diabetes mellitus with hyperglycemia: Secondary | ICD-10-CM | POA: Diagnosis present

## 2020-06-25 DIAGNOSIS — R451 Restlessness and agitation: Secondary | ICD-10-CM | POA: Diagnosis not present

## 2020-06-25 DIAGNOSIS — I509 Heart failure, unspecified: Secondary | ICD-10-CM | POA: Diagnosis not present

## 2020-06-25 DIAGNOSIS — R4781 Slurred speech: Secondary | ICD-10-CM | POA: Diagnosis not present

## 2020-06-25 DIAGNOSIS — K828 Other specified diseases of gallbladder: Secondary | ICD-10-CM | POA: Diagnosis not present

## 2020-06-25 DIAGNOSIS — Z7951 Long term (current) use of inhaled steroids: Secondary | ICD-10-CM | POA: Diagnosis not present

## 2020-06-25 DIAGNOSIS — I255 Ischemic cardiomyopathy: Secondary | ICD-10-CM | POA: Diagnosis present

## 2020-06-25 DIAGNOSIS — I6523 Occlusion and stenosis of bilateral carotid arteries: Secondary | ICD-10-CM | POA: Diagnosis not present

## 2020-06-25 DIAGNOSIS — J9601 Acute respiratory failure with hypoxia: Secondary | ICD-10-CM | POA: Diagnosis not present

## 2020-06-25 DIAGNOSIS — Z515 Encounter for palliative care: Secondary | ICD-10-CM | POA: Diagnosis not present

## 2020-06-25 DIAGNOSIS — I672 Cerebral atherosclerosis: Secondary | ICD-10-CM | POA: Diagnosis not present

## 2020-06-25 DIAGNOSIS — E870 Hyperosmolality and hypernatremia: Secondary | ICD-10-CM | POA: Diagnosis not present

## 2020-06-25 DIAGNOSIS — R0989 Other specified symptoms and signs involving the circulatory and respiratory systems: Secondary | ICD-10-CM | POA: Diagnosis not present

## 2020-06-25 DIAGNOSIS — J690 Pneumonitis due to inhalation of food and vomit: Secondary | ICD-10-CM | POA: Diagnosis not present

## 2020-06-25 DIAGNOSIS — Z66 Do not resuscitate: Secondary | ICD-10-CM | POA: Diagnosis not present

## 2020-06-25 DIAGNOSIS — I5022 Chronic systolic (congestive) heart failure: Secondary | ICD-10-CM | POA: Diagnosis not present

## 2020-06-25 DIAGNOSIS — I63411 Cerebral infarction due to embolism of right middle cerebral artery: Secondary | ICD-10-CM | POA: Diagnosis present

## 2020-06-25 DIAGNOSIS — Z7189 Other specified counseling: Secondary | ICD-10-CM | POA: Diagnosis not present

## 2020-06-25 DIAGNOSIS — I10 Essential (primary) hypertension: Secondary | ICD-10-CM | POA: Diagnosis not present

## 2020-06-25 DIAGNOSIS — I6032 Nontraumatic subarachnoid hemorrhage from left posterior communicating artery: Secondary | ICD-10-CM | POA: Diagnosis not present

## 2020-06-25 DIAGNOSIS — D696 Thrombocytopenia, unspecified: Secondary | ICD-10-CM | POA: Diagnosis not present

## 2020-06-25 DIAGNOSIS — G4733 Obstructive sleep apnea (adult) (pediatric): Secondary | ICD-10-CM | POA: Diagnosis present

## 2020-06-25 DIAGNOSIS — G9349 Other encephalopathy: Secondary | ICD-10-CM | POA: Diagnosis present

## 2020-06-25 DIAGNOSIS — J449 Chronic obstructive pulmonary disease, unspecified: Secondary | ICD-10-CM | POA: Insufficient documentation

## 2020-06-25 DIAGNOSIS — J90 Pleural effusion, not elsewhere classified: Secondary | ICD-10-CM | POA: Diagnosis not present

## 2020-06-25 DIAGNOSIS — Z9989 Dependence on other enabling machines and devices: Secondary | ICD-10-CM | POA: Diagnosis not present

## 2020-06-25 DIAGNOSIS — R918 Other nonspecific abnormal finding of lung field: Secondary | ICD-10-CM | POA: Diagnosis not present

## 2020-06-25 DIAGNOSIS — J9811 Atelectasis: Secondary | ICD-10-CM | POA: Diagnosis not present

## 2020-06-25 DIAGNOSIS — Z951 Presence of aortocoronary bypass graft: Secondary | ICD-10-CM | POA: Diagnosis not present

## 2020-06-25 DIAGNOSIS — R471 Dysarthria and anarthria: Secondary | ICD-10-CM | POA: Diagnosis present

## 2020-06-25 DIAGNOSIS — I671 Cerebral aneurysm, nonruptured: Secondary | ICD-10-CM | POA: Diagnosis not present

## 2020-06-25 DIAGNOSIS — I5189 Other ill-defined heart diseases: Secondary | ICD-10-CM | POA: Diagnosis not present

## 2020-06-25 DIAGNOSIS — I517 Cardiomegaly: Secondary | ICD-10-CM | POA: Diagnosis not present

## 2020-06-25 DIAGNOSIS — E119 Type 2 diabetes mellitus without complications: Secondary | ICD-10-CM | POA: Diagnosis not present

## 2020-06-25 DIAGNOSIS — I110 Hypertensive heart disease with heart failure: Secondary | ICD-10-CM | POA: Diagnosis present

## 2020-06-25 DIAGNOSIS — G8194 Hemiplegia, unspecified affecting left nondominant side: Secondary | ICD-10-CM | POA: Diagnosis present

## 2020-06-25 DIAGNOSIS — R29810 Facial weakness: Secondary | ICD-10-CM | POA: Diagnosis not present

## 2020-06-25 DIAGNOSIS — J986 Disorders of diaphragm: Secondary | ICD-10-CM | POA: Diagnosis not present

## 2020-06-25 DIAGNOSIS — Z8673 Personal history of transient ischemic attack (TIA), and cerebral infarction without residual deficits: Secondary | ICD-10-CM | POA: Diagnosis not present

## 2020-06-25 DIAGNOSIS — R0902 Hypoxemia: Secondary | ICD-10-CM | POA: Diagnosis not present

## 2020-06-25 DIAGNOSIS — R1312 Dysphagia, oropharyngeal phase: Secondary | ICD-10-CM | POA: Diagnosis present

## 2020-06-25 DIAGNOSIS — I77810 Thoracic aortic ectasia: Secondary | ICD-10-CM | POA: Diagnosis not present

## 2020-06-25 LAB — DIFFERENTIAL
Abs Immature Granulocytes: 0.04 10*3/uL (ref 0.00–0.07)
Basophils Absolute: 0 10*3/uL (ref 0.0–0.1)
Basophils Relative: 1 %
Eosinophils Absolute: 0.1 10*3/uL (ref 0.0–0.5)
Eosinophils Relative: 2 %
Immature Granulocytes: 1 %
Lymphocytes Relative: 33 %
Lymphs Abs: 2.7 10*3/uL (ref 0.7–4.0)
Monocytes Absolute: 0.6 10*3/uL (ref 0.1–1.0)
Monocytes Relative: 7 %
Neutro Abs: 4.8 10*3/uL (ref 1.7–7.7)
Neutrophils Relative %: 56 %

## 2020-06-25 LAB — CBC
HCT: 44.7 % (ref 39.0–52.0)
Hemoglobin: 14.2 g/dL (ref 13.0–17.0)
MCH: 30.5 pg (ref 26.0–34.0)
MCHC: 31.8 g/dL (ref 30.0–36.0)
MCV: 95.9 fL (ref 80.0–100.0)
Platelets: 185 10*3/uL (ref 150–400)
RBC: 4.66 MIL/uL (ref 4.22–5.81)
RDW: 13.5 % (ref 11.5–15.5)
WBC: 8.3 10*3/uL (ref 4.0–10.5)
nRBC: 0.2 % (ref 0.0–0.2)

## 2020-06-25 LAB — I-STAT CHEM 8, ED
BUN: 23 mg/dL (ref 8–23)
Calcium, Ion: 1.16 mmol/L (ref 1.15–1.40)
Chloride: 106 mmol/L (ref 98–111)
Creatinine, Ser: 0.9 mg/dL (ref 0.61–1.24)
Glucose, Bld: 171 mg/dL — ABNORMAL HIGH (ref 70–99)
HCT: 44 % (ref 39.0–52.0)
Hemoglobin: 15 g/dL (ref 13.0–17.0)
Potassium: 3.6 mmol/L (ref 3.5–5.1)
Sodium: 145 mmol/L (ref 135–145)
TCO2: 24 mmol/L (ref 22–32)

## 2020-06-25 LAB — COMPREHENSIVE METABOLIC PANEL
ALT: 38 U/L (ref 0–44)
AST: 21 U/L (ref 15–41)
Albumin: 3.8 g/dL (ref 3.5–5.0)
Alkaline Phosphatase: 53 U/L (ref 38–126)
Anion gap: 9 (ref 5–15)
BUN: 20 mg/dL (ref 8–23)
CO2: 27 mmol/L (ref 22–32)
Calcium: 9.2 mg/dL (ref 8.9–10.3)
Chloride: 106 mmol/L (ref 98–111)
Creatinine, Ser: 1.07 mg/dL (ref 0.61–1.24)
GFR, Estimated: >60 mL/min (ref >60)
Glucose, Bld: 181 mg/dL — ABNORMAL HIGH (ref 70–99)
Potassium: 3.6 mmol/L (ref 3.5–5.1)
Sodium: 142 mmol/L (ref 135–145)
Total Bilirubin: 2.3 mg/dL — ABNORMAL HIGH (ref 0.3–1.2)
Total Protein: 6.3 g/dL — ABNORMAL LOW (ref 6.5–8.1)

## 2020-06-25 LAB — PROTIME-INR
INR: 1.2 (ref 0.8–1.2)
Prothrombin Time: 14.8 seconds (ref 11.4–15.2)

## 2020-06-25 LAB — RESP PANEL BY RT-PCR (FLU A&B, COVID) ARPGX2
Influenza A by PCR: NEGATIVE
Influenza B by PCR: NEGATIVE
SARS Coronavirus 2 by RT PCR: NEGATIVE

## 2020-06-25 LAB — APTT: aPTT: 30 seconds (ref 24–36)

## 2020-06-25 LAB — CBG MONITORING, ED: Glucose-Capillary: 176 mg/dL — ABNORMAL HIGH (ref 70–99)

## 2020-06-25 LAB — ETHANOL: Alcohol, Ethyl (B): <10 mg/dL (ref <10)

## 2020-06-25 SURGERY — IR WITH ANESTHESIA
Anesthesia: General

## 2020-06-25 MED ORDER — IOHEXOL 350 MG/ML SOLN
80.0000 mL | Freq: Once | INTRAVENOUS | Status: AC | PRN
  Administered 2020-06-25: 80 mL via INTRAVENOUS

## 2020-06-25 MED ORDER — SODIUM CHLORIDE 0.9 % IV SOLN
50.0000 mL | Freq: Once | INTRAVENOUS | Status: AC
  Administered 2020-06-25: 50 mL via INTRAVENOUS

## 2020-06-25 MED ORDER — ALTEPLASE (STROKE) FULL DOSE INFUSION
90.0000 mg | Freq: Once | INTRAVENOUS | Status: AC
  Administered 2020-06-25: 90 mg via INTRAVENOUS
  Filled 2020-06-25: qty 100

## 2020-06-25 MED ORDER — IOHEXOL 350 MG/ML SOLN
40.0000 mL | Freq: Once | INTRAVENOUS | Status: AC | PRN
  Administered 2020-06-25: 40 mL via INTRAVENOUS

## 2020-06-25 NOTE — Code Documentation (Signed)
Report given to [facility]  

## 2020-06-25 NOTE — Code Documentation (Addendum)
Stroke Response Documentation Code Documentation  Paul Bryant is a 74 y.o. male arriving to [facility] ED via [facility] on 06/25/2020 with past medical hx of OSA, CAD, hx of MI, hx of CABG. Code stroke was activated by EMS. Patient from home where he was LKW at 1115 and now complaining of left sided weakness and left facial droop. His wife came home to find him lying on the floor not moving his left side, drooling, and having difficulty speaking. On aspirin 81 mg daily. Stroke team at the bedside on patient arrival. Labs drawn and patient cleared for CT by Dr. Billy Fischer. Patient to CT with team. NIHSS 15, see documentation for details and code stroke times. Patient with disoriented, right gaze preference , left hemianopia, left facial droop, left arm weakness, left leg weakness, left decreased sensation, Expressive aphasia , dysarthria  and left neglect on exam. The following imaging was completed:  CT, CTA head and neck, CTP. Patient is a candidate for tPA. Pt initially had light bruising to his left forehead, during tPA administration a hematoma formed to his left forehead and bruising increased. Neurologist made aware, tPA continued. Pt is an IR candidate. Transport coordinated for transfer to [facility]. Bedside report given to [facility].   Kate Larock L Braylin Formby  Rapid Response RN

## 2020-06-25 NOTE — ED Triage Notes (Signed)
Pt came by EMS. Wife states pts LSN is 1115. Pt came as code stroke. Pt has a hematoma above right eye. Pts VS are stable. BS 246

## 2020-06-25 NOTE — ED Provider Notes (Signed)
[facility]PART[facility] Provider Note   CSN: [id number] Arrival date & time: 06/25/20  1310     History Chief Complaint  Patient presents with  . Code Stroke    Paul Bryant is a 74 y.o. male.  HPI      74yo male with history of CAD, CHF, htn, COPD< hlpd, OSA, presents to the ED as a Code Stroke.  Per EMS, wife saw him normal at 1115-he was eating and talking.  At 1230 she returned home to find him lying on the floor, not moving his left side, drooling and difficult to understand.   History is limited by acuity of condition. Pt taken to CT , evaluated in CT.  Per Neurology who evaluated him at bridge with other ED provider clearing airway he had initial NIH 15, was not moving left arm but at time of my evaluation is. On my history, he did not note abnormalities bur reports new mild headache.   Past Medical History:  Diagnosis Date  . CAD (coronary artery disease)   . Carotid artery occlusion   . COPD (chronic obstructive pulmonary disease) (HCC)    mild  . Diverticulitis oct. 2011  . Dizziness   . Gout   . Heart attack ([place])   . Hernia    umbilical and ventral  . Hyperlipidemia   . Hypertension   . IBS (irritable bowel syndrome)   . Neuropathy   . Obesity, morbid ([place])   . OSA on CPAP   . RBBB (right bundle branch block with left anterior fascicular block)   . S/P cardiac catheterization 04/10/2005   ,  2019 with occluded grafts, LIMA is patent  . Sleep apnea   . Urolithiasis     Patient Active Problem List   Diagnosis Date Noted  . Urinary urgency 06/14/2020  . Laryngopharyngeal reflux (LPR) 03/12/2018  . Hyperkalemia 11/06/2017  . Pain in joint, lower leg 09/30/2017  . Bilateral carotid artery disease ([place]) 08/22/2016  . CAD (coronary artery disease) 09/15/2013  . Morbid obesity ([place]) 09/15/2013  . Hyperlipidemia 09/15/2013  . Aortic stenosis 09/15/2013  . Dilated aortic root ([place]) 09/15/2013  . Spinal stenosis  09/15/2013  . Rhinitis, nonallergic 06/05/2012  . Obstructive sleep apnea 06/15/2011  . DYSPNEA 11/12/2009    Past Surgical History:  Procedure Laterality Date  . lap sigmoid colectomy due to persistent    . LEFT HEART CATH AND CORS/GRAFTS ANGIOGRAPHY N/A 08/25/2017   Procedure: LEFT HEART CATH AND CORS/GRAFTS ANGIOGRAPHY;  Surgeon: Belva Crome, MD;  Location: [facility];  Service: Cardiovascular;  Laterality: N/A;  . open heart bypass  aug. 1998  . quadruple bypass    . UMBILICAL HERNIA REPAIR     right proctoscopy  . URETERAL STENT PLACEMENT  09-18-2010   Dr Diona Fanti       Family History  Problem Relation Age of Onset  . Throat cancer Mother   . Heart disease Father   . Heart attack Father   . Colon cancer Neg Hx   . Liver disease Neg Hx     Social History   Tobacco Use  . Smoking status: Never Smoker  . Smokeless tobacco: Current User    Types: Snuff  Vaping Use  . Vaping Use: Never used  Substance Use Topics  . Alcohol use: Yes    Comment: socially 2-3 cans of beer  . Drug use: No    Home Medications Prior to Admission medications  Medication Sig Start Date End Date Taking? Authorizing Provider  aspirin 81 MG tablet Take 81 mg by mouth daily.      [provider]  azithromycin (ZITHROMAX) 250 MG tablet Take 250 mg by mouth daily.  06/07/20   [provider]  bisoprolol (ZEBETA) 10 MG tablet TAKE 1 TABLET(10 MG) BY MOUTH TWICE DAILY 02/07/20   Jerline Pain, MD  Budeson-Glycopyrrol-Formoterol (BREZTRI AEROSPHERE) 160-9-4.8 MCG/ACT AERO Inhale 2 puffs into the lungs 2 (two) times daily. 01/31/20   Deneise Lever, MD  canagliflozin (INVOKANA) 100 MG TABS tablet Take 100 mg by mouth daily.    [provider]  doxazosin (CARDURA) 1 MG tablet Take 1 mg by mouth at bedtime.    [provider]  nitroGLYCERIN (NITROSTAT) 0.4 MG SL tablet Place 1 tablet (0.4 mg total) under the tongue every 5 (five) minutes as needed for  chest pain (max 3 doses). 07/14/17   Jerline Pain, MD  Promethazine-Codeine 6.25-10 MG/5ML SOLN Take 5 mLs by mouth at bedtime.  06/07/20   [provider]  rosuvastatin (CRESTOR) 40 MG tablet TAKE 1 TABLET EVERY DAY 01/04/20   Jerline Pain, MD  sacubitril-valsartan (ENTRESTO) 49-51 MG Take 1 tablet by mouth 2 (two) times daily. 06/19/20   Jerline Pain, MD  torsemide (DEMADEX) 20 MG tablet Take 20 mg by mouth daily.    [provider]    Allergies    Penicillins  Review of Systems   Review of Systems  Unable to perform ROS: Acuity of condition  Constitutional: Negative for fever.  Respiratory: Negative for shortness of breath.   Cardiovascular: Negative for chest pain.  Neurological: Positive for facial asymmetry, speech difficulty, weakness, numbness and headaches.    Physical Exam Updated Vital Signs BP 127/87   Pulse 78   Temp 97.8 F (36.6 C)   Resp 18   SpO2 100%   Physical Exam Constitutional:      General: He is not in acute distress.    Appearance: Normal appearance. He is not ill-appearing.  HENT:     Head: Normocephalic and atraumatic.  Eyes:     General: No visual field deficit.    Extraocular Movements: Extraocular movements intact.     Conjunctiva/sclera: Conjunctivae normal.     Pupils: Pupils are equal, round, and reactive to light.  Cardiovascular:     Rate and Rhythm: Normal rate and regular rhythm.     Pulses: Normal pulses.  Pulmonary:     Effort: Pulmonary effort is normal. No respiratory distress.  Musculoskeletal:        General: No swelling or tenderness.     Cervical back: Normal range of motion.  Skin:    General: Skin is warm and dry.     Findings: No erythema or rash.  Neurological:     Mental Status: He is alert and oriented to person, place, and time.     GCS: GCS eye subscore is 4. GCS verbal subscore is 5. GCS motor subscore is 6.     Cranial Nerves: Cranial nerve deficit present. No dysarthria or facial  asymmetry.     Sensory: Sensory deficit present.     Motor: Weakness present. No tremor.     Coordination: Coordination normal. Finger-Nose-Finger Test normal.     Gait: Gait normal.     Comments: Asks if we are at [facility] or [facility], oriented to self and type of location.  Left facial droop, LUE and LLE weakness. Left sided  neglect, right gaze preference, dysarthria     ED Results / Procedures / Treatments   Labs (all labs ordered are listed, but only abnormal results are displayed) Labs Reviewed  COMPREHENSIVE METABOLIC PANEL - Abnormal; Notable for the following components:      Result Value   Glucose, Bld 181 (*)    Total Protein 6.3 (*)    Total Bilirubin 2.3 (*)    All other components within normal limits  CBG MONITORING, ED - Abnormal; Notable for the following components:   Glucose-Capillary 176 (*)    All other components within normal limits  I-STAT CHEM 8, ED - Abnormal; Notable for the following components:   Glucose, Bld 171 (*)    All other components within normal limits  RESP PANEL BY RT-PCR (FLU A&B, COVID) ARPGX2  ETHANOL  PROTIME-INR  APTT  CBC  DIFFERENTIAL    EKG EKG Interpretation  Date/Time:  Monday June 25 2020 14:13:07 EST Ventricular Rate:  74 PR Interval:    QRS Duration: 171 QT Interval:  509 QTC Calculation: 565 R Axis:   52 Text Interpretation: Atrial fibrillation Ventricular premature complex IVCD, consider atypical RBBB Repol abnrm suggests ischemia, diffuse leads No significant change since last tracing Confirmed by Gareth Morgan [id number]) on 06/26/2020 1:04:34 AM   Radiology CT CEREBRAL PERFUSION W CONTRAST  Result Date: 06/25/2020 CLINICAL DATA:  Neuro deficit, acute stroke suspected. Left-sided deficits and neglect. EXAM: CT PERFUSION BRAIN TECHNIQUE: Multiphase CT imaging of the brain was performed following IV bolus contrast injection. Subsequent parametric perfusion maps were calculated using RAPID software. CONTRAST:  48mL  OMNIPAQUE IOHEXOL 350 MG/ML SOLN COMPARISON:  Same day code stroke imaging. FINDINGS: CT Brain Perfusion Findings: CBF (<30%) Volume: 32mL Perfusion (Tmax>6.0s) volume: 257mL Mismatch Volume: 2 33mL ASPECTS on noncontrast CT Head: 9. Infarct Core: 66 mL mL Infarction Location:Posterior right MCA territory, involving the posterior frontal lobe, parietal lobe, and superior temporal lobe. IMPRESSION: 1. Findings consistent with 66 mL infarct in the posterior right MCA territory with surrounding penumbra. 2. RAPID also suggest there is penumbra in additional vascular territories, including the posterior circulation. This is of unclear significance and may be artifactual or relate to hypoperfusion given severe atherosclerosis seen on same day CTA. Findings discussed with Dr. Milas Gain at 2:18 p.m. via telephone. Electronically Signed   By: Margaretha Sheffield MD   On: 06/25/2020 14:27   CT HEAD CODE STROKE WO CONTRAST  Result Date: 06/25/2020 CLINICAL DATA:  Code stroke. Left-sided deficits. Acute stroke suspected. EXAM: CT HEAD WITHOUT CONTRAST TECHNIQUE: Contiguous axial images were obtained from the base of the skull through the vertex without intravenous contrast. COMPARISON:  None. FINDINGS: Brain: Remote appearing infarcts in the right frontal lobe, left caudate, and right corona radiata. Small hypodensity in the inferior right basal ganglia, most likely a dilated perivascular space. Subtle loss of gray-white differentiation involving the right insula with streak through this region. Small hypodensity in the right basal ganglia, favored to represent a dilated perivascular space. Vascular: Calcific atherosclerosis. No definite hyperdense vessel identified. Skull: No acute fracture.  Left frontal contusion. Sinuses/Orbits: Scattered paranasal sinus mucosal thickening. Other: No mastoid effusions. ASPECTS [facility] Stroke Program Early CT Score) - Ganglionic level infarction (caudate, lentiform nuclei, internal capsule,  insula, M1-M3 cortex): 6 - Supraganglionic infarction (M4-M6 cortex): 3 Total score (0-10 with 10 being normal): 9 IMPRESSION: 1. Subtle hypodensity in the right insular ribbon, which may be related to streak artifact or an acute MCA territory infarct. ASPECTS is 9.  MRI could further evaluate if clinically indicated. 2. Multiple remote appearing infarcts, as described above. 3. No acute hemorrhage. 4. Left frontal scalp contusion without acute fracture. Code stroke imaging results were communicated on 06/25/2020 at 1:32 pm to provider Dr. Milas Gain via telephone, who verbally acknowledged these results. Electronically Signed   By: Margaretha Sheffield MD   On: 06/25/2020 13:37   CT ANGIO HEAD CODE STROKE  Result Date: 06/25/2020 CLINICAL DATA:  Concern for stroke.  Left-sided deficit. EXAM: CT ANGIOGRAPHY HEAD AND NECK TECHNIQUE: Multidetector CT imaging of the head and neck was performed using the standard protocol during bolus administration of intravenous contrast. Multiplanar CT image reconstructions and MIPs were obtained to evaluate the vascular anatomy. Carotid stenosis measurements (when applicable) are obtained utilizing NASCET criteria, using the distal internal carotid diameter as the denominator. CONTRAST:  73mL OMNIPAQUE IOHEXOL 350 MG/ML SOLN COMPARISON:  Same day CT head. FINDINGS: CTA NECK FINDINGS Aortic arch: Calcific atherosclerosis of the aorta and its branch vessels. Aneurysmal dilation of the ascending aorta up to 4.6 cm. Right carotid system: Calcific atherosclerosis at the bifurcation without greater than 50% narrowing. Left carotid system: Calcific and noncalcific atherosclerosis at the bifurcation with approximately 70% stenosis. Vertebral arteries: The right vertebral origin is poorly visualized secondary to streak artifact and beam hardening artifact. Suspected at least moderate stenosis of the right vertebral artery, which is non dominant. The right vertebral artery is diminutive throughout  its course and poorly opacified in the upper neck. Severe stenosis at the dural margin with non opacification distally. Skeleton: Multilevel degenerative changes of the cervical spine. No evidence of acute fracture. Other neck: No mass or suspicious adenopathy. Upper chest: Dependent atelectasis.  Small right pleural effusion. Review of the MIP images confirms the above findings CTA HEAD FINDINGS Anterior circulation: Focal filling defect within the distal right M1 MCA, immediately proximal to the bifurcation (see series 7, image 102 and series 8, image 65). There is opacification of more distal M2 branches. There is extensive calcific atherosclerosis of bilateral cavernous carotid arteries with bilateral paraclinoid narrowing, moderate to severe on the right and moderate on the left. Multifocal mild narrowing of the left MCA and bilateral ACA branches without evidence of hemodynamically significant stenosis. Posterior circulation: Moderate stenosis of the left vertebral artery origin. Additionally, there is multifocal moderate stenosis of the intradural left vertebral artery and focal severe stenosis of the distal intradural left vertebral artery. Mild narrowing of the basilar artery. Bilateral posterior cerebral arteries are patent. Six multifocal mild narrowing of the posterior cerebral arteries without occlusion. Venous sinuses: Poorly visualized. Review of the MIP images confirms the above findings IMPRESSION: 1. Focal filling defect in the distal right M1 MCA, concerning for nonocclusive thrombus. Asymmetric more distal right MCA vasculature. 2. Calcific atherosclerosis of bilateral cavernous carotid arteries with bilateral paraclinoid narrowing, moderate to severe on the right and moderate on the left. 3. Moderate multifocal stenosis of the left vertebral artery with focal severe stenosis of the distal left intradural vertebral artery. 4. Suspected at least moderate stenosis of the non dominant right  vertebral artery with severe stenosis at the dural margin and non opacification of the intradural right V4 vertebral segment. 5. Bilateral carotid bifurcation atherosclerosis with approximately 70% narrowing of the proximal left ICA. 6. Small right pleural effusion and dependent bilateral atelectasis. 7. Aneurysmal dilation of the partially imaged ascending aorta up to 4.6 cm. Recommend semi-annual imaging followup by CTA or MRA and referral to cardiothoracic surgery if not already obtained.  This recommendation follows 2010 ACCF/AHA/AATS/ACR/ASA/SCA/SCAI/SIR/STS/SVM Guidelines for the Diagnosis and Management of Patients With Thoracic Aortic Disease. Circulation. 2010; 121: U932-T55. Aortic aneurysm NOS (ICD10-I71.9) Critical findings were communicated on 06/25/2020 at 1:52 pm to provider Dr. Milas Gain via telephone, who verbally acknowledged these results. Electronically Signed   By: Margaretha Sheffield MD   On: 06/25/2020 14:21   CT ANGIO NECK CODE STROKE  Result Date: 06/25/2020 CLINICAL DATA:  Concern for stroke.  Left-sided deficit. EXAM: CT ANGIOGRAPHY HEAD AND NECK TECHNIQUE: Multidetector CT imaging of the head and neck was performed using the standard protocol during bolus administration of intravenous contrast. Multiplanar CT image reconstructions and MIPs were obtained to evaluate the vascular anatomy. Carotid stenosis measurements (when applicable) are obtained utilizing NASCET criteria, using the distal internal carotid diameter as the denominator. CONTRAST:  51mL OMNIPAQUE IOHEXOL 350 MG/ML SOLN COMPARISON:  Same day CT head. FINDINGS: CTA NECK FINDINGS Aortic arch: Calcific atherosclerosis of the aorta and its branch vessels. Aneurysmal dilation of the ascending aorta up to 4.6 cm. Right carotid system: Calcific atherosclerosis at the bifurcation without greater than 50% narrowing. Left carotid system: Calcific and noncalcific atherosclerosis at the bifurcation with approximately 70% stenosis. Vertebral  arteries: The right vertebral origin is poorly visualized secondary to streak artifact and beam hardening artifact. Suspected at least moderate stenosis of the right vertebral artery, which is non dominant. The right vertebral artery is diminutive throughout its course and poorly opacified in the upper neck. Severe stenosis at the dural margin with non opacification distally. Skeleton: Multilevel degenerative changes of the cervical spine. No evidence of acute fracture. Other neck: No mass or suspicious adenopathy. Upper chest: Dependent atelectasis.  Small right pleural effusion. Review of the MIP images confirms the above findings CTA HEAD FINDINGS Anterior circulation: Focal filling defect within the distal right M1 MCA, immediately proximal to the bifurcation (see series 7, image 102 and series 8, image 65). There is opacification of more distal M2 branches. There is extensive calcific atherosclerosis of bilateral cavernous carotid arteries with bilateral paraclinoid narrowing, moderate to severe on the right and moderate on the left. Multifocal mild narrowing of the left MCA and bilateral ACA branches without evidence of hemodynamically significant stenosis. Posterior circulation: Moderate stenosis of the left vertebral artery origin. Additionally, there is multifocal moderate stenosis of the intradural left vertebral artery and focal severe stenosis of the distal intradural left vertebral artery. Mild narrowing of the basilar artery. Bilateral posterior cerebral arteries are patent. Six multifocal mild narrowing of the posterior cerebral arteries without occlusion. Venous sinuses: Poorly visualized. Review of the MIP images confirms the above findings IMPRESSION: 1. Focal filling defect in the distal right M1 MCA, concerning for nonocclusive thrombus. Asymmetric more distal right MCA vasculature. 2. Calcific atherosclerosis of bilateral cavernous carotid arteries with bilateral paraclinoid narrowing, moderate  to severe on the right and moderate on the left. 3. Moderate multifocal stenosis of the left vertebral artery with focal severe stenosis of the distal left intradural vertebral artery. 4. Suspected at least moderate stenosis of the non dominant right vertebral artery with severe stenosis at the dural margin and non opacification of the intradural right V4 vertebral segment. 5. Bilateral carotid bifurcation atherosclerosis with approximately 70% narrowing of the proximal left ICA. 6. Small right pleural effusion and dependent bilateral atelectasis. 7. Aneurysmal dilation of the partially imaged ascending aorta up to 4.6 cm. Recommend semi-annual imaging followup by CTA or MRA and referral to cardiothoracic surgery if not already obtained. This recommendation follows 2010  ACCF/AHA/AATS/ACR/ASA/SCA/SCAI/SIR/STS/SVM Guidelines for the Diagnosis and Management of Patients With Thoracic Aortic Disease. Circulation. 2010; 121: F621-H08. Aortic aneurysm NOS (ICD10-I71.9) Critical findings were communicated on 06/25/2020 at 1:52 pm to provider Dr. Milas Gain via telephone, who verbally acknowledged these results. Electronically Signed   By: Margaretha Sheffield MD   On: 06/25/2020 14:21    Procedures .Critical Care Performed by: Gareth Morgan, MD Authorized by: Gareth Morgan, MD   Critical care provider statement:    Critical care time (minutes):  30   Critical care was time spent personally by me on the following activities:  Discussions with consultants, evaluation of patient's response to treatment, examination of patient, ordering and performing treatments and interventions, ordering and review of laboratory studies, ordering and review of radiographic studies, pulse oximetry, re-evaluation of patient's condition, obtaining history from patient or surrogate and review of old charts   (including critical care time)  Medications Ordered in ED Medications  iohexol (OMNIPAQUE) 350 MG/ML injection 80 mL (80 mLs  Intravenous Contrast Given 06/25/20 1330)  alteplase (ACTIVASE) 1 mg/mL infusion 90 mg (90 mg Intravenous Transfusing/Transfer 06/25/20 1450)    Followed by  0.9 %  sodium chloride infusion (50 mLs Intravenous Transfusing/Transfer 06/25/20 1450)  iohexol (OMNIPAQUE) 350 MG/ML injection 40 mL (40 mLs Intravenous Contrast Given 06/25/20 1358)    ED Course  I have reviewed the triage vital signs and the nursing notes.  Pertinent labs & imaging results that were available during my care of the patient were reviewed by me and considered in my medical decision making (see chart for details).    MDM Rules/Calculators/A&P                          74yo male with history of CAD, CHF, htn, COPD< hlpd, OSA, presents to the ED as a Code Stroke with left sided weakness, neglect.  Neurology at bedside on patient's arrival. Discussed tPA with wife and this was initiated. CTA showed M1 occlusion.  Transferred to [facility] for emergent neuro-intervention per stroke team recommendations.  Final Clinical Impression(s) / ED Diagnoses Final diagnoses:  Cerebrovascular accident (CVA), unspecified mechanism ([place])  Occlusion of right middle cerebral artery    Rx / DC Orders ED Discharge Orders    None       Gareth Morgan, MD 06/26/20 0106

## 2020-06-25 NOTE — Progress Notes (Signed)
PHARMACIST CODE STROKE RESPONSE  Notified to mix tPA at 1324 by Dr. Lorrin Goodell Delivered tPA to RN at 1328  tPA dose = 9mg  bolus over 1 minute followed by 81mg  for a total dose of 90mg  over 1 hour  Issues/delays encountered (if applicable): N/A  Rebbeca Paul, PharmD PGY1 Pharmacy Resident 06/25/2020 1:33 PM  Please check [email or web address] for unit-specific pharmacy phone numbers.

## 2020-06-25 NOTE — Code Documentation (Signed)
Report given to Jenny Reichmann, Therapist, sports at [facility] IR.

## 2020-06-25 NOTE — Consult Note (Signed)
Neurology Consult H&P  CC: CODE STROKE. Left sided weakness, left droop, dysarthria  History is obtained from: EMS, chart, wife  HPI: Paul Bryant is a 74 y.o. male with a PMHx of CAD s/p MI and CABG, CHF, HTN, COPD, HLD, OSA on Bipap, and morbid obesity who presents as a CODE STROKE to [facility] ED. Per EMS, wife last saw pt normal around 1115 when they were eating breakfast at home. Pt was eating and talking as per normal. Wife left to run errand and returned home aroudn 12:30 pm to find pt lying in the floor, not moving his left side, drooling, and with difficult to understand speech. Wife called EMS at 12:41 pm.   Pt was taken from bridge to CT scanner immediately after arrival, lab draw and short exam. His original NIH scale was 15, later decreased to 13, and after TPA, even lower. Upon arrival, pt was dysarthric. He was not moving his left arm which was flaccid on exam. No movement of LLE either. No issues with movement of the left side. He knew his name and age but could not relate any history of events. He did not name objects at that time, but did later.   After CT head, Dr Milas Gain spoke to pt's wife about the risks and benefits of tPA. Wife agreed to tPA at 1424. tPA was mixed and given to patient at 1428. Verified with wife that pt only on ASA 81mg  at home and no other anti coagulation. No recent surgeries, no hx of ICH.  CT scan was done followed by CTA head and neck and CT perfusion. CTA revealed multifocal areas of stenosis to multiple intracranial vessels with a R MCA distal M1 occlusion. Mismatch was noted on CTP in the posterior R MCA territory. Films were discussed with radiology and Dr. Erlinda Hong of neurology. Given M1 thrombus, decision was made for IR thrombectomy. However, there was already a pt on the table, so pt was transferred to [facility] in [place] for immediate thrombectomy. Dr. Therese Sarah with the Neurointervention team at [facility] was  Provided with patient's wife phone number and  requested that he speak with her to discuss the procedure risks and benefits.  Later, pt was able to move his left side and even cross his arms over his chest. He was able to name objects and follow commands. Still dysarthric, but slightly improved.   Pt prepared for transfer to [facility] by stroke Conservation officer, historic buildings.    LKW: 4008 tpa given?: Yes at 1328.  IR Thrombectomy? Yes, pt transferred to [facility] for procedure due to IR suite already in use.   NIHSS: 15 down to 13.   ROS: Pt is unable to participate in complete ROS due to mental status. ROS per HPI. Wife reports a recent URI treated with Zithromax by PCP. Cough medicine for cough.   Past Medical History:  Diagnosis Date   CAD (coronary artery disease)    Carotid artery occlusion    COPD (chronic obstructive pulmonary disease) (HCC)    mild   Diverticulitis oct. 2011   Dizziness    Gout    Heart attack ([place])    Hernia    umbilical and ventral   Hyperlipidemia    Hypertension    IBS (irritable bowel syndrome)    Neuropathy    Obesity, morbid (HCC)    OSA on CPAP    RBBB (right bundle branch block with left anterior fascicular block)    S/P cardiac catheterization 04/10/2005   ,  2019 with occluded grafts, LIMA is patent   Sleep apnea    Urolithiasis      Family History  Problem Relation Age of Onset   Throat cancer Mother    Heart disease Father    Heart attack Father    Colon cancer Neg Hx    Liver disease Neg Hx     Social History:  reports that he has never smoked. His smokeless tobacco use includes snuff. He reports current alcohol use. He reports that he does not use drugs.   Prior to Admission medications   Medication Sig Start Date End Date Taking? Authorizing Provider  aspirin 81 MG tablet Take 81 mg by mouth daily.      [provider]  azithromycin (ZITHROMAX) 250 MG tablet Take 250 mg by mouth daily.  06/07/20   [provider]  bisoprolol (ZEBETA) 10 MG  tablet TAKE 1 TABLET(10 MG) BY MOUTH TWICE DAILY 02/07/20   Jerline Pain, MD  Budeson-Glycopyrrol-Formoterol (BREZTRI AEROSPHERE) 160-9-4.8 MCG/ACT AERO Inhale 2 puffs into the lungs 2 (two) times daily. 01/31/20   Deneise Lever, MD  canagliflozin (INVOKANA) 100 MG TABS tablet Take 100 mg by mouth daily.    [provider]  doxazosin (CARDURA) 1 MG tablet Take 1 mg by mouth at bedtime.    [provider]  nitroGLYCERIN (NITROSTAT) 0.4 MG SL tablet Place 1 tablet (0.4 mg total) under the tongue every 5 (five) minutes as needed for chest pain (max 3 doses). 07/14/17   Jerline Pain, MD  Promethazine-Codeine 6.25-10 MG/5ML SOLN Take 5 mLs by mouth at bedtime.  06/07/20   [provider]  rosuvastatin (CRESTOR) 40 MG tablet TAKE 1 TABLET EVERY DAY 01/04/20   Jerline Pain, MD  sacubitril-valsartan (ENTRESTO) 49-51 MG Take 1 tablet by mouth 2 (two) times daily. 06/19/20   Jerline Pain, MD  torsemide (DEMADEX) 20 MG tablet Take 20 mg by mouth daily.    [provider]    Exam: Current vital signs: BP 127/87    Pulse 78    Temp 97.8 F (36.6 C)    Resp 18    SpO2 100%   Physical Exam  Constitutional: Appears well-developed and well-nourished. Morbidly obese.  Psych: Affect appropriate to situation Eyes: No scleral injection HENT: No OP obstrucion Head: Normocephalic.  Cardiovascular: Normal rate and regular rhythm.  Respiratory: Effort normal  GI: Soft.   Skin: WDI  Neuro: Mental Status: Patient is awake, alert, oriented to self, hospital. Not oriented to month, date or year. He is able to tell us his name and how old he is.  Patient is unable to give a clear and coherent history. Right gaze preference Cranial Nerves: QZ:ESPQZR are equal, round, and reactive to light with Left homonymous hemianopsia. III,IV, VI: R gaze prefernce, barely crosses midline. V: Facial sensation is decreased on the left to light touch. On first exam, pt could not tell  where he was being touched. Later, he could distinguish.  VII: Facial movement with noted left facial droop.  VIII: hearing is intact to voice X: Uvula elevates symmetrically XI: - XII: - Motor: RUE and RLE: 5/5 LUE and LLE with drift. Strength about 4 out of 5 BL. Noted to have some neglect of the LUE. Sensory: Sensation decreased in LUE and LLE. Deep Tendon Reflexes: 1+ and symmetric in the biceps and patellae. Plantars: Toes are downgoing bilaterally. Cerebellar: FNF normal on right. Pt can not perform on the left on  immediate exam.    I have reviewed labs in epic and the pertinent results are: CBG 176. INR 1.2.  CTH without contrast: - Subtle hypodensity in the right insular ribbon, which may be related to streak artifact or an acute MCA territory infarct. ASPECTS is 9. MRI could further evaluate if clinically indicated. - Multiple remote appearing infarcts, as described above. - No acute hemorrhage. - Left frontal scalp contusion without acute fracture.  CT Angio head and Neck: 1. Focal filling defect in the distal right M1 MCA, concerning for nonocclusive thrombus. Asymmetric more distal right MCA vasculature. 2. Calcific atherosclerosis of bilateral cavernous carotid arteries with bilateral paraclinoid narrowing, moderate to severe on the right and moderate on the left. 3. Moderate multifocal stenosis of the left vertebral artery with focal severe stenosis of the distal left intradural vertebral artery. 4. Suspected at least moderate stenosis of the non dominant right vertebral artery with severe stenosis at the dural margin and non opacification of the intradural right V4 vertebral segment. 5. Bilateral carotid bifurcation atherosclerosis with approximately 70% narrowing of the proximal left ICA. 6. Small right pleural effusion and dependent bilateral atelectasis. 7. Aneurysmal dilation of the partially imaged ascending aorta up to 4.6 cm. Recommend semi-annual  imaging followup by CTA or MRA and referral to cardiothoracic surgery if not already obtained. This recommendation follows 2010 ACCF/AHA/AATS/ACR/ASA/SCA/SCAI/SIR/STS/SVM Guidelines for the Diagnosis and Management of Patients With Thoracic Aortic Disease. Circulation. 2010; 121: E366-Q94. Aortic aneurysm NOS (ICD10-I71.9)  CT perfusion: 1. Findings consistent with 66 mL infarct in the posterior right MCA territory with surrounding penumbra. 2. RAPID also suggest there is penumbra in additional vascular territories, including the posterior circulation. This is of unclear significance and may be artifactual or relate to hypoperfusion given severe atherosclerosis seen on same day CTA.   Assessment: Paul Bryant is a 74 y.o. male PMHx of CAD s/p MI and CABG, HTN, obesity, COPD, HLD, CHF, OSA on bipap. Pt presented as CODE STROKE with symptoms concerning for an acute R MCA stroke with left sided weakness, dysarthria, right gaze preference, Left homonymous hemianopsia vs neglect and left facial droop. CTH with hypodense R insular ribbon, CT Angio with a R distal MCA M1 thrombus, CTP with R posterior MCA mismatch. He was within tPA window. I discussed a 30% chance of improvement in stroke symptoms and a 2-3% risk of ICH and a total of about 6% risk of systemic bleeding with his wife Paul Bryant over the phone and she opted to go with tPA.   His case was discussed with Dr. Therese Sarah at [facility] and he was transferred for thrombectomy. We had a patient who was already in the IR suite and given the emergent nature of the procedure, we had to transfer him rather than wait. I personally informed wife of the transfer and asked Dr. Ishmael Holter to call Paul Bryant to discuss thrombectomy.  Impression: MCA M1 right focal filling defect with impression of non occlusive thrombus. Noted 53ml infarct with surround penumbra.  Extensive intracranial arterial stenosis.   Plan: - Transferred to [facility] for thrombectomy.  This  patient is critically ill and at significant risk of neurological worsening, death and care requires constant monitoring of vital signs, hemodynamics,respiratory and cardiac monitoring, neurological assessment, discussion with family, other specialists and medical decision making of high complexity. I spent 100 minutes of neurocritical care time  in the care of  this patient. This was time spent independent of any time provided by nurse practitioner or PA.   Ashayla Subia  [place] Pager Number [phone]

## 2020-06-26 DIAGNOSIS — J449 Chronic obstructive pulmonary disease, unspecified: Secondary | ICD-10-CM | POA: Diagnosis not present

## 2020-06-26 DIAGNOSIS — G4733 Obstructive sleep apnea (adult) (pediatric): Secondary | ICD-10-CM | POA: Diagnosis not present

## 2020-06-26 DIAGNOSIS — E785 Hyperlipidemia, unspecified: Secondary | ICD-10-CM | POA: Diagnosis not present

## 2020-06-26 DIAGNOSIS — I77810 Thoracic aortic ectasia: Secondary | ICD-10-CM | POA: Diagnosis not present

## 2020-06-26 DIAGNOSIS — I5189 Other ill-defined heart diseases: Secondary | ICD-10-CM | POA: Diagnosis not present

## 2020-06-26 DIAGNOSIS — I639 Cerebral infarction, unspecified: Secondary | ICD-10-CM | POA: Diagnosis not present

## 2020-06-26 DIAGNOSIS — I63511 Cerebral infarction due to unspecified occlusion or stenosis of right middle cerebral artery: Secondary | ICD-10-CM | POA: Diagnosis not present

## 2020-06-26 DIAGNOSIS — I10 Essential (primary) hypertension: Secondary | ICD-10-CM | POA: Diagnosis not present

## 2020-06-26 DIAGNOSIS — I080 Rheumatic disorders of both mitral and aortic valves: Secondary | ICD-10-CM | POA: Diagnosis not present

## 2020-06-26 DIAGNOSIS — E1165 Type 2 diabetes mellitus with hyperglycemia: Secondary | ICD-10-CM | POA: Diagnosis not present

## 2020-06-26 DIAGNOSIS — I5022 Chronic systolic (congestive) heart failure: Secondary | ICD-10-CM | POA: Diagnosis not present

## 2020-06-27 DIAGNOSIS — Z9282 Status post administration of tPA (rtPA) in a different facility within the last 24 hours prior to admission to current facility: Secondary | ICD-10-CM | POA: Diagnosis not present

## 2020-06-27 DIAGNOSIS — I6523 Occlusion and stenosis of bilateral carotid arteries: Secondary | ICD-10-CM | POA: Diagnosis not present

## 2020-06-27 DIAGNOSIS — E1165 Type 2 diabetes mellitus with hyperglycemia: Secondary | ICD-10-CM | POA: Diagnosis not present

## 2020-06-27 DIAGNOSIS — Z951 Presence of aortocoronary bypass graft: Secondary | ICD-10-CM | POA: Diagnosis not present

## 2020-06-27 DIAGNOSIS — I10 Essential (primary) hypertension: Secondary | ICD-10-CM | POA: Diagnosis not present

## 2020-06-27 DIAGNOSIS — R0989 Other specified symptoms and signs involving the circulatory and respiratory systems: Secondary | ICD-10-CM | POA: Diagnosis not present

## 2020-06-27 DIAGNOSIS — I517 Cardiomegaly: Secondary | ICD-10-CM | POA: Diagnosis not present

## 2020-06-27 DIAGNOSIS — J90 Pleural effusion, not elsewhere classified: Secondary | ICD-10-CM | POA: Diagnosis not present

## 2020-06-27 DIAGNOSIS — I4891 Unspecified atrial fibrillation: Secondary | ICD-10-CM | POA: Diagnosis not present

## 2020-06-27 DIAGNOSIS — J449 Chronic obstructive pulmonary disease, unspecified: Secondary | ICD-10-CM | POA: Diagnosis not present

## 2020-06-27 DIAGNOSIS — I6601 Occlusion and stenosis of right middle cerebral artery: Secondary | ICD-10-CM | POA: Diagnosis not present

## 2020-06-27 DIAGNOSIS — I63511 Cerebral infarction due to unspecified occlusion or stenosis of right middle cerebral artery: Secondary | ICD-10-CM | POA: Diagnosis not present

## 2020-06-28 DIAGNOSIS — I639 Cerebral infarction, unspecified: Secondary | ICD-10-CM | POA: Diagnosis not present

## 2020-06-28 DIAGNOSIS — R0989 Other specified symptoms and signs involving the circulatory and respiratory systems: Secondary | ICD-10-CM | POA: Diagnosis not present

## 2020-06-28 DIAGNOSIS — I255 Ischemic cardiomyopathy: Secondary | ICD-10-CM | POA: Diagnosis not present

## 2020-06-28 DIAGNOSIS — I63511 Cerebral infarction due to unspecified occlusion or stenosis of right middle cerebral artery: Secondary | ICD-10-CM | POA: Diagnosis not present

## 2020-06-28 DIAGNOSIS — E7800 Pure hypercholesterolemia, unspecified: Secondary | ICD-10-CM | POA: Diagnosis not present

## 2020-06-28 DIAGNOSIS — G9340 Encephalopathy, unspecified: Secondary | ICD-10-CM | POA: Diagnosis not present

## 2020-06-28 DIAGNOSIS — E876 Hypokalemia: Secondary | ICD-10-CM | POA: Diagnosis not present

## 2020-06-28 DIAGNOSIS — R4182 Altered mental status, unspecified: Secondary | ICD-10-CM | POA: Diagnosis not present

## 2020-06-28 DIAGNOSIS — I480 Paroxysmal atrial fibrillation: Secondary | ICD-10-CM | POA: Diagnosis not present

## 2020-06-29 DIAGNOSIS — E7800 Pure hypercholesterolemia, unspecified: Secondary | ICD-10-CM | POA: Diagnosis not present

## 2020-06-29 DIAGNOSIS — R778 Other specified abnormalities of plasma proteins: Secondary | ICD-10-CM | POA: Diagnosis not present

## 2020-06-29 DIAGNOSIS — I255 Ischemic cardiomyopathy: Secondary | ICD-10-CM | POA: Diagnosis not present

## 2020-06-29 DIAGNOSIS — I4891 Unspecified atrial fibrillation: Secondary | ICD-10-CM | POA: Diagnosis not present

## 2020-06-29 DIAGNOSIS — E876 Hypokalemia: Secondary | ICD-10-CM | POA: Diagnosis not present

## 2020-06-29 DIAGNOSIS — I5023 Acute on chronic systolic (congestive) heart failure: Secondary | ICD-10-CM | POA: Diagnosis not present

## 2020-06-29 DIAGNOSIS — J984 Other disorders of lung: Secondary | ICD-10-CM | POA: Diagnosis not present

## 2020-06-29 DIAGNOSIS — I63511 Cerebral infarction due to unspecified occlusion or stenosis of right middle cerebral artery: Secondary | ICD-10-CM | POA: Diagnosis not present

## 2020-06-29 DIAGNOSIS — R0602 Shortness of breath: Secondary | ICD-10-CM | POA: Diagnosis not present

## 2020-06-29 DIAGNOSIS — R0989 Other specified symptoms and signs involving the circulatory and respiratory systems: Secondary | ICD-10-CM | POA: Diagnosis not present

## 2020-06-29 DIAGNOSIS — E870 Hyperosmolality and hypernatremia: Secondary | ICD-10-CM | POA: Diagnosis not present

## 2020-06-29 DIAGNOSIS — I517 Cardiomegaly: Secondary | ICD-10-CM | POA: Diagnosis not present

## 2020-06-29 DIAGNOSIS — I480 Paroxysmal atrial fibrillation: Secondary | ICD-10-CM | POA: Diagnosis not present

## 2020-06-30 DIAGNOSIS — R0989 Other specified symptoms and signs involving the circulatory and respiratory systems: Secondary | ICD-10-CM | POA: Diagnosis not present

## 2020-06-30 DIAGNOSIS — E876 Hypokalemia: Secondary | ICD-10-CM | POA: Diagnosis not present

## 2020-06-30 DIAGNOSIS — I4891 Unspecified atrial fibrillation: Secondary | ICD-10-CM | POA: Diagnosis not present

## 2020-06-30 DIAGNOSIS — R0602 Shortness of breath: Secondary | ICD-10-CM | POA: Diagnosis not present

## 2020-06-30 DIAGNOSIS — R778 Other specified abnormalities of plasma proteins: Secondary | ICD-10-CM | POA: Diagnosis not present

## 2020-06-30 DIAGNOSIS — E870 Hyperosmolality and hypernatremia: Secondary | ICD-10-CM | POA: Diagnosis not present

## 2020-06-30 DIAGNOSIS — I255 Ischemic cardiomyopathy: Secondary | ICD-10-CM | POA: Diagnosis not present

## 2020-06-30 DIAGNOSIS — I480 Paroxysmal atrial fibrillation: Secondary | ICD-10-CM | POA: Diagnosis not present

## 2020-06-30 DIAGNOSIS — I63511 Cerebral infarction due to unspecified occlusion or stenosis of right middle cerebral artery: Secondary | ICD-10-CM | POA: Diagnosis not present

## 2020-06-30 DIAGNOSIS — I5023 Acute on chronic systolic (congestive) heart failure: Secondary | ICD-10-CM | POA: Diagnosis not present

## 2020-06-30 DIAGNOSIS — E7800 Pure hypercholesterolemia, unspecified: Secondary | ICD-10-CM | POA: Diagnosis not present

## 2020-07-01 DIAGNOSIS — I480 Paroxysmal atrial fibrillation: Secondary | ICD-10-CM | POA: Diagnosis not present

## 2020-07-01 DIAGNOSIS — E876 Hypokalemia: Secondary | ICD-10-CM | POA: Diagnosis not present

## 2020-07-01 DIAGNOSIS — E7800 Pure hypercholesterolemia, unspecified: Secondary | ICD-10-CM | POA: Diagnosis not present

## 2020-07-01 DIAGNOSIS — I255 Ischemic cardiomyopathy: Secondary | ICD-10-CM | POA: Diagnosis not present

## 2020-07-01 DIAGNOSIS — R778 Other specified abnormalities of plasma proteins: Secondary | ICD-10-CM | POA: Diagnosis not present

## 2020-07-01 DIAGNOSIS — I63511 Cerebral infarction due to unspecified occlusion or stenosis of right middle cerebral artery: Secondary | ICD-10-CM | POA: Diagnosis not present

## 2020-07-01 DIAGNOSIS — E870 Hyperosmolality and hypernatremia: Secondary | ICD-10-CM | POA: Diagnosis not present

## 2020-07-01 DIAGNOSIS — I4891 Unspecified atrial fibrillation: Secondary | ICD-10-CM | POA: Diagnosis not present

## 2020-07-01 DIAGNOSIS — I5023 Acute on chronic systolic (congestive) heart failure: Secondary | ICD-10-CM | POA: Diagnosis not present

## 2020-07-02 DIAGNOSIS — R918 Other nonspecific abnormal finding of lung field: Secondary | ICD-10-CM | POA: Diagnosis not present

## 2020-07-02 DIAGNOSIS — E7800 Pure hypercholesterolemia, unspecified: Secondary | ICD-10-CM | POA: Diagnosis not present

## 2020-07-02 DIAGNOSIS — I255 Ischemic cardiomyopathy: Secondary | ICD-10-CM | POA: Diagnosis not present

## 2020-07-02 DIAGNOSIS — I4891 Unspecified atrial fibrillation: Secondary | ICD-10-CM | POA: Diagnosis not present

## 2020-07-02 DIAGNOSIS — I63411 Cerebral infarction due to embolism of right middle cerebral artery: Secondary | ICD-10-CM | POA: Diagnosis not present

## 2020-07-02 DIAGNOSIS — J9811 Atelectasis: Secondary | ICD-10-CM | POA: Diagnosis not present

## 2020-07-02 DIAGNOSIS — I5023 Acute on chronic systolic (congestive) heart failure: Secondary | ICD-10-CM | POA: Diagnosis not present

## 2020-07-02 DIAGNOSIS — I63511 Cerebral infarction due to unspecified occlusion or stenosis of right middle cerebral artery: Secondary | ICD-10-CM | POA: Diagnosis not present

## 2020-07-02 DIAGNOSIS — E8770 Fluid overload, unspecified: Secondary | ICD-10-CM | POA: Diagnosis not present

## 2020-07-02 DIAGNOSIS — E870 Hyperosmolality and hypernatremia: Secondary | ICD-10-CM | POA: Diagnosis not present

## 2020-07-02 DIAGNOSIS — I480 Paroxysmal atrial fibrillation: Secondary | ICD-10-CM | POA: Diagnosis not present

## 2020-07-02 DIAGNOSIS — E876 Hypokalemia: Secondary | ICD-10-CM | POA: Diagnosis not present

## 2020-07-02 DIAGNOSIS — J984 Other disorders of lung: Secondary | ICD-10-CM | POA: Diagnosis not present

## 2020-07-02 DIAGNOSIS — R778 Other specified abnormalities of plasma proteins: Secondary | ICD-10-CM | POA: Diagnosis not present

## 2020-07-02 DIAGNOSIS — R0989 Other specified symptoms and signs involving the circulatory and respiratory systems: Secondary | ICD-10-CM | POA: Diagnosis not present

## 2020-07-03 DIAGNOSIS — D696 Thrombocytopenia, unspecified: Secondary | ICD-10-CM | POA: Diagnosis not present

## 2020-07-03 DIAGNOSIS — Z9989 Dependence on other enabling machines and devices: Secondary | ICD-10-CM | POA: Diagnosis not present

## 2020-07-03 DIAGNOSIS — I63411 Cerebral infarction due to embolism of right middle cerebral artery: Secondary | ICD-10-CM | POA: Diagnosis not present

## 2020-07-03 DIAGNOSIS — G4733 Obstructive sleep apnea (adult) (pediatric): Secondary | ICD-10-CM | POA: Diagnosis not present

## 2020-07-03 DIAGNOSIS — E870 Hyperosmolality and hypernatremia: Secondary | ICD-10-CM | POA: Diagnosis not present

## 2020-07-03 DIAGNOSIS — I5023 Acute on chronic systolic (congestive) heart failure: Secondary | ICD-10-CM | POA: Diagnosis not present

## 2020-07-03 DIAGNOSIS — R1312 Dysphagia, oropharyngeal phase: Secondary | ICD-10-CM | POA: Diagnosis not present

## 2020-07-03 DIAGNOSIS — I4891 Unspecified atrial fibrillation: Secondary | ICD-10-CM | POA: Diagnosis not present

## 2020-07-03 DIAGNOSIS — I255 Ischemic cardiomyopathy: Secondary | ICD-10-CM | POA: Diagnosis not present

## 2020-07-03 DIAGNOSIS — I10 Essential (primary) hypertension: Secondary | ICD-10-CM | POA: Diagnosis not present

## 2020-07-04 DIAGNOSIS — D696 Thrombocytopenia, unspecified: Secondary | ICD-10-CM | POA: Diagnosis not present

## 2020-07-04 DIAGNOSIS — I4891 Unspecified atrial fibrillation: Secondary | ICD-10-CM | POA: Diagnosis not present

## 2020-07-04 DIAGNOSIS — I10 Essential (primary) hypertension: Secondary | ICD-10-CM | POA: Diagnosis not present

## 2020-07-04 DIAGNOSIS — R1312 Dysphagia, oropharyngeal phase: Secondary | ICD-10-CM | POA: Diagnosis not present

## 2020-07-04 DIAGNOSIS — I255 Ischemic cardiomyopathy: Secondary | ICD-10-CM | POA: Diagnosis not present

## 2020-07-04 DIAGNOSIS — Z9989 Dependence on other enabling machines and devices: Secondary | ICD-10-CM | POA: Diagnosis not present

## 2020-07-04 DIAGNOSIS — I63411 Cerebral infarction due to embolism of right middle cerebral artery: Secondary | ICD-10-CM | POA: Diagnosis not present

## 2020-07-04 DIAGNOSIS — G4733 Obstructive sleep apnea (adult) (pediatric): Secondary | ICD-10-CM | POA: Diagnosis not present

## 2020-07-04 DIAGNOSIS — I5023 Acute on chronic systolic (congestive) heart failure: Secondary | ICD-10-CM | POA: Diagnosis not present

## 2020-07-04 DIAGNOSIS — E870 Hyperosmolality and hypernatremia: Secondary | ICD-10-CM | POA: Diagnosis not present

## 2020-07-05 DIAGNOSIS — I63411 Cerebral infarction due to embolism of right middle cerebral artery: Secondary | ICD-10-CM | POA: Diagnosis not present

## 2020-07-06 DIAGNOSIS — R1312 Dysphagia, oropharyngeal phase: Secondary | ICD-10-CM | POA: Diagnosis not present

## 2020-07-06 DIAGNOSIS — I63411 Cerebral infarction due to embolism of right middle cerebral artery: Secondary | ICD-10-CM | POA: Diagnosis not present

## 2020-07-07 DIAGNOSIS — E7800 Pure hypercholesterolemia, unspecified: Secondary | ICD-10-CM | POA: Diagnosis not present

## 2020-07-07 DIAGNOSIS — I63511 Cerebral infarction due to unspecified occlusion or stenosis of right middle cerebral artery: Secondary | ICD-10-CM | POA: Diagnosis not present

## 2020-07-07 DIAGNOSIS — I480 Paroxysmal atrial fibrillation: Secondary | ICD-10-CM | POA: Diagnosis not present

## 2020-07-07 DIAGNOSIS — I5023 Acute on chronic systolic (congestive) heart failure: Secondary | ICD-10-CM | POA: Diagnosis not present

## 2020-07-07 DIAGNOSIS — I255 Ischemic cardiomyopathy: Secondary | ICD-10-CM | POA: Diagnosis not present

## 2020-07-07 DIAGNOSIS — E876 Hypokalemia: Secondary | ICD-10-CM | POA: Diagnosis not present

## 2020-07-07 DIAGNOSIS — E870 Hyperosmolality and hypernatremia: Secondary | ICD-10-CM | POA: Diagnosis not present

## 2020-07-08 DIAGNOSIS — R0600 Dyspnea, unspecified: Secondary | ICD-10-CM | POA: Diagnosis not present

## 2020-07-08 DIAGNOSIS — R0602 Shortness of breath: Secondary | ICD-10-CM | POA: Diagnosis not present

## 2020-07-08 DIAGNOSIS — I6601 Occlusion and stenosis of right middle cerebral artery: Secondary | ICD-10-CM | POA: Diagnosis not present

## 2020-07-08 DIAGNOSIS — I5022 Chronic systolic (congestive) heart failure: Secondary | ICD-10-CM | POA: Diagnosis not present

## 2020-07-08 DIAGNOSIS — R451 Restlessness and agitation: Secondary | ICD-10-CM | POA: Diagnosis not present

## 2020-07-08 DIAGNOSIS — I63511 Cerebral infarction due to unspecified occlusion or stenosis of right middle cerebral artery: Secondary | ICD-10-CM | POA: Diagnosis not present

## 2020-07-08 DIAGNOSIS — I4510 Unspecified right bundle-branch block: Secondary | ICD-10-CM | POA: Diagnosis not present

## 2020-07-08 DIAGNOSIS — Z7189 Other specified counseling: Secondary | ICD-10-CM | POA: Diagnosis not present

## 2020-07-08 DIAGNOSIS — Z515 Encounter for palliative care: Secondary | ICD-10-CM | POA: Diagnosis not present

## 2020-07-09 DIAGNOSIS — Z9989 Dependence on other enabling machines and devices: Secondary | ICD-10-CM | POA: Diagnosis not present

## 2020-07-09 DIAGNOSIS — E876 Hypokalemia: Secondary | ICD-10-CM | POA: Diagnosis not present

## 2020-07-09 DIAGNOSIS — I6601 Occlusion and stenosis of right middle cerebral artery: Secondary | ICD-10-CM | POA: Diagnosis not present

## 2020-07-09 DIAGNOSIS — R451 Restlessness and agitation: Secondary | ICD-10-CM | POA: Diagnosis not present

## 2020-07-09 DIAGNOSIS — R0600 Dyspnea, unspecified: Secondary | ICD-10-CM | POA: Diagnosis not present

## 2020-07-09 DIAGNOSIS — G4733 Obstructive sleep apnea (adult) (pediatric): Secondary | ICD-10-CM | POA: Diagnosis not present

## 2020-07-09 DIAGNOSIS — R0602 Shortness of breath: Secondary | ICD-10-CM | POA: Diagnosis not present

## 2020-07-09 DIAGNOSIS — I4891 Unspecified atrial fibrillation: Secondary | ICD-10-CM | POA: Diagnosis not present

## 2020-07-09 DIAGNOSIS — I63511 Cerebral infarction due to unspecified occlusion or stenosis of right middle cerebral artery: Secondary | ICD-10-CM | POA: Diagnosis not present

## 2020-07-09 DIAGNOSIS — Z7189 Other specified counseling: Secondary | ICD-10-CM | POA: Diagnosis not present

## 2020-07-09 DIAGNOSIS — I4510 Unspecified right bundle-branch block: Secondary | ICD-10-CM | POA: Diagnosis not present

## 2020-07-09 DIAGNOSIS — Z515 Encounter for palliative care: Secondary | ICD-10-CM | POA: Diagnosis not present

## 2020-07-28 DEATH — deceased

## 2020-10-02 ENCOUNTER — Ambulatory Visit: Payer: Medicare Other | Admitting: Internal Medicine

## 2021-03-14 IMAGING — CT CT HEAD CODE STROKE
3 series · 14 of 47 positions shown, 16 images · non-contrast
Comparison: None.

CLINICAL DATA: Code stroke. Left-sided deficits. Acute stroke
suspected.

EXAM:
CT HEAD WITHOUT CONTRAST
TECHNIQUE: Contiguous axial images were obtained from the base of the skull
through the vertex without intravenous contrast.

[Series 3: head 5.0 st · axial · 0.41mm/px · z∈[-238,-88]mm · 8 of 36 slices shown, 10 images]
[im 3/36  brain]
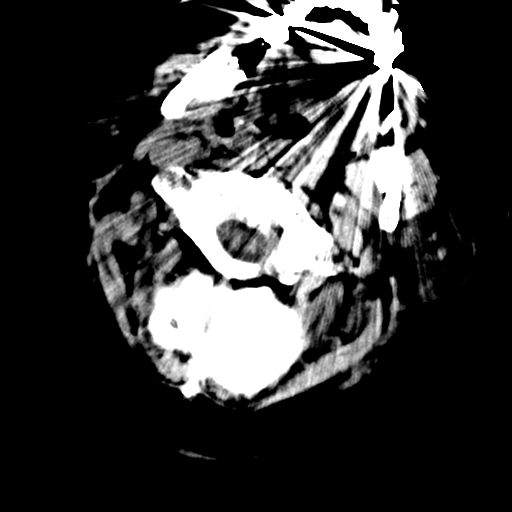
[im 3/36  bone]
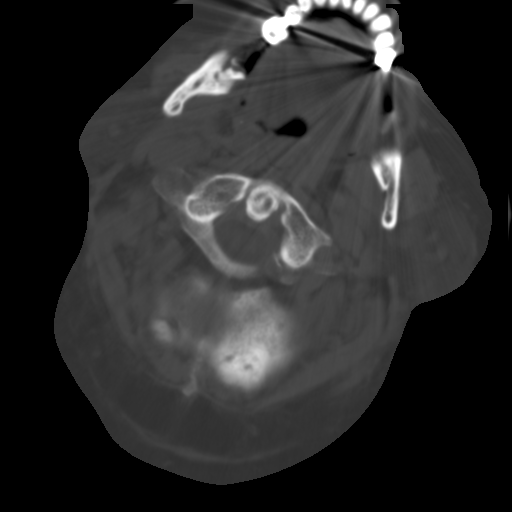
[im 8/36  brain]
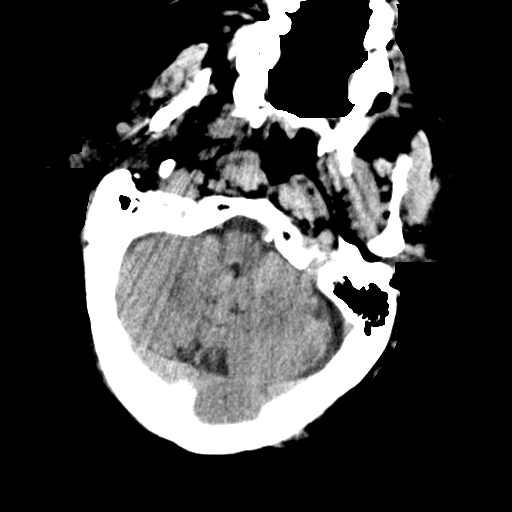
[im 11/36  brain]
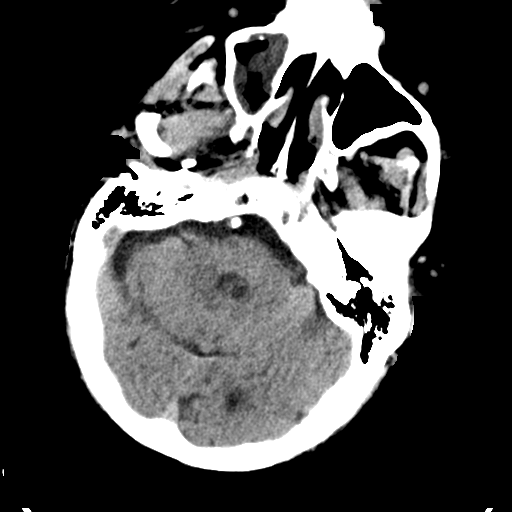
[im 16/36  brain]
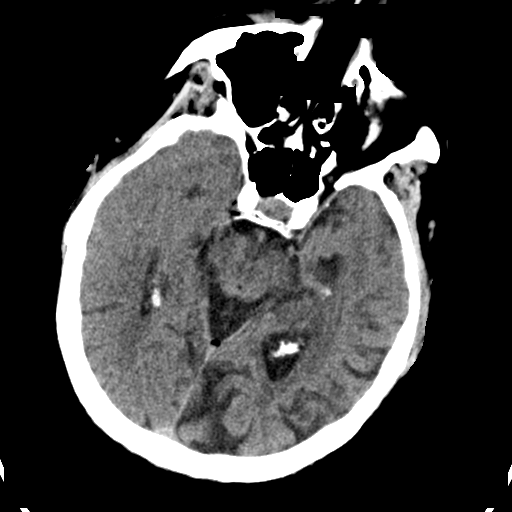
[im 20/36  brain]
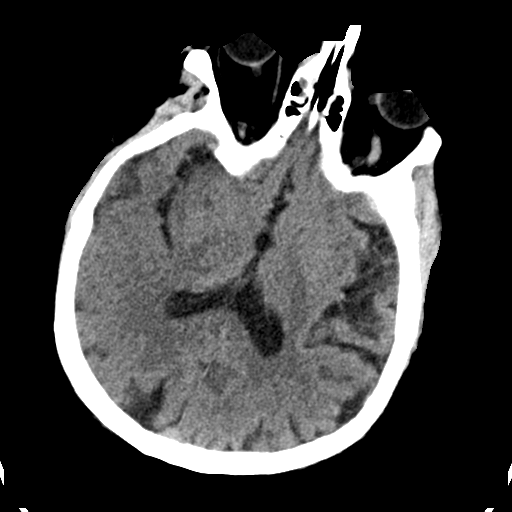
[im 20/36  bone]
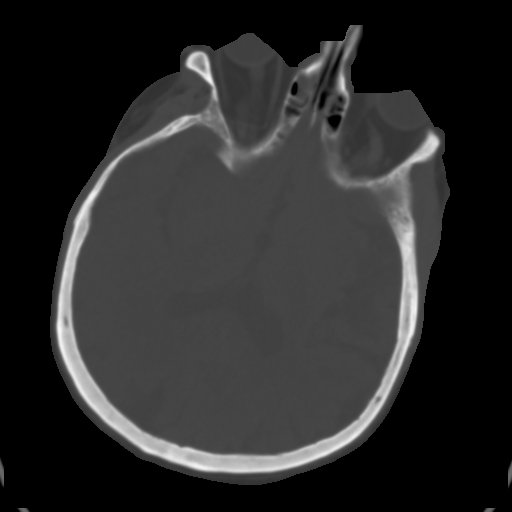
[im 25/36  brain]
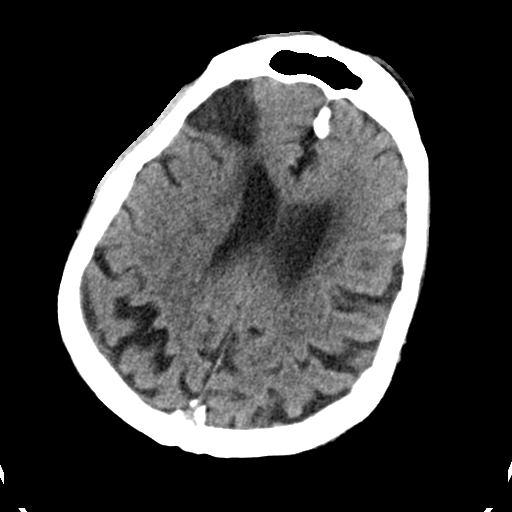
[im 28/36  brain]
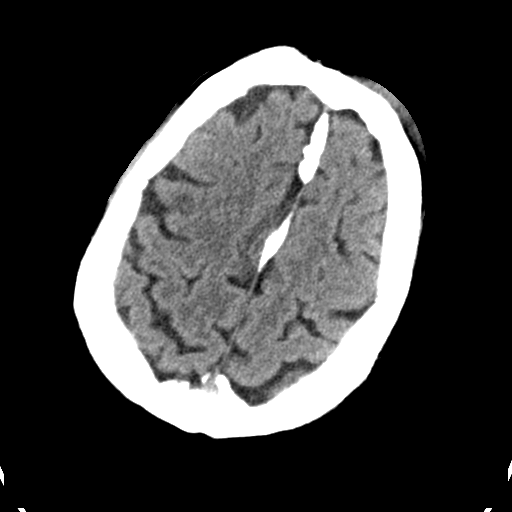
[im 33/36  brain]
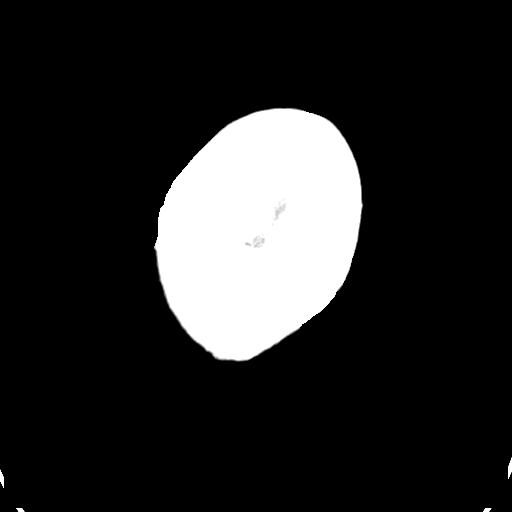

[Series 5: head 3.0 cor st · coronal · 0.34mm/px · 3 of 75 slices shown]
[im 26/75  brain]
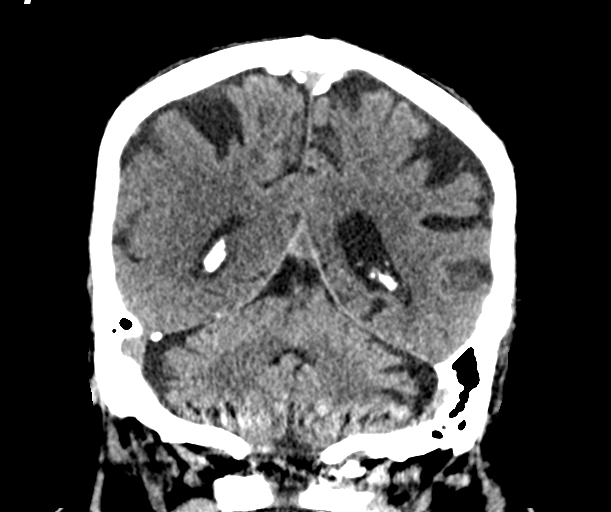
[im 34/75  brain]
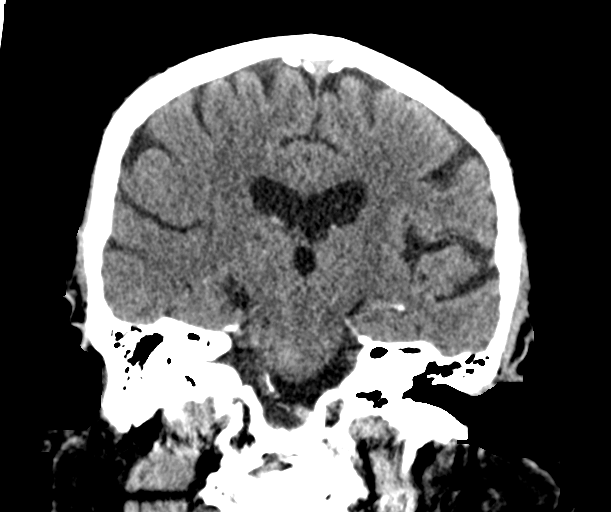
[im 41/75  brain]
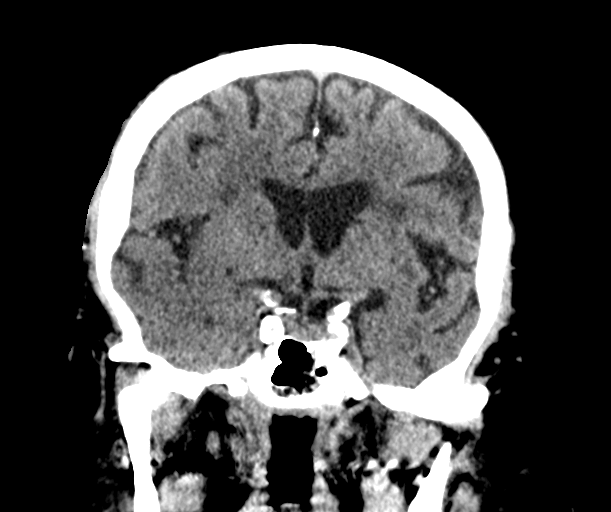

[Series 6: head 3.0 sag st · sagittal · 0.34mm/px · 3 of 67 slices shown]
[im 23/67  brain]
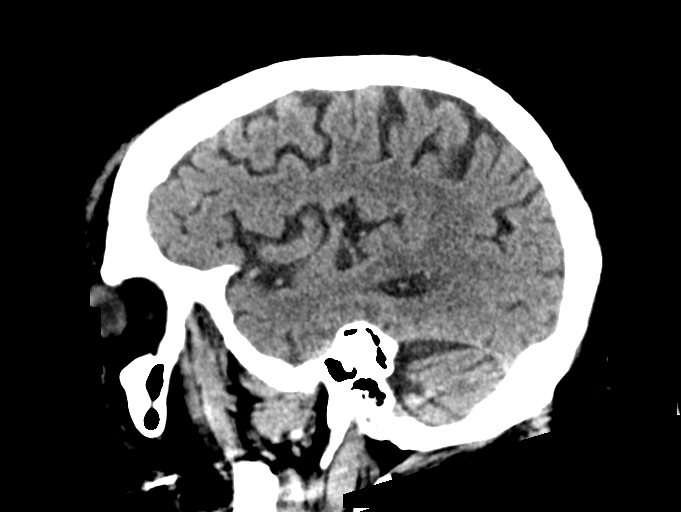
[im 34/67  brain]
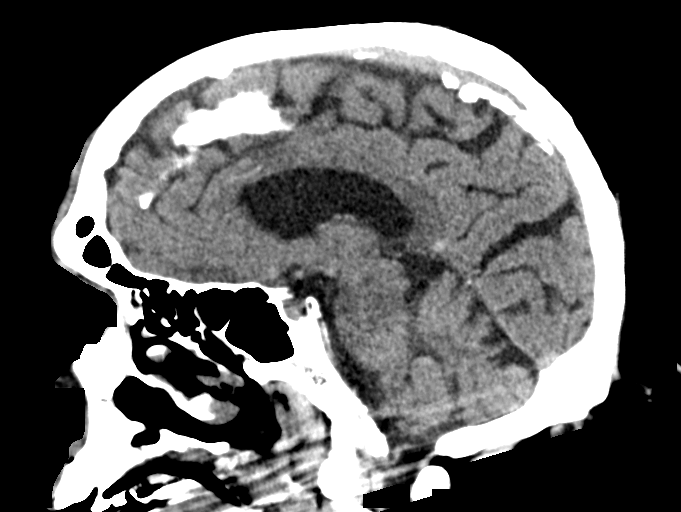
[im 45/67  brain]
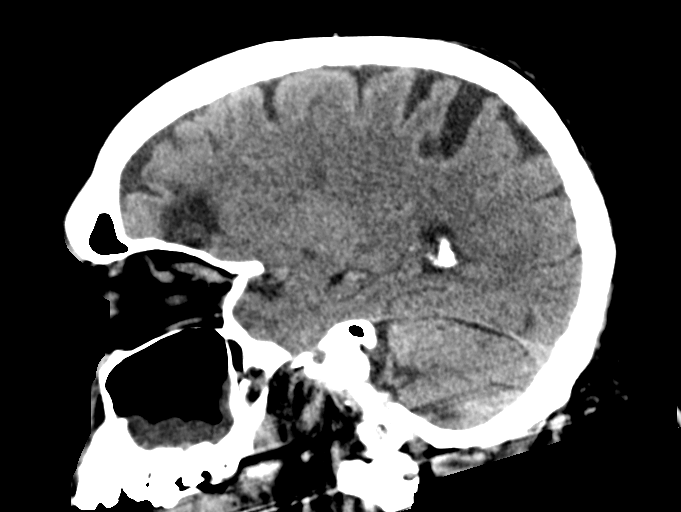

[14 of 47 positions shown; findings below may reference images not displayed]

FINDINGS: Brain: Remote appearing infarcts in the right frontal lobe, left
caudate, and right corona radiata. Small hypodensity in the inferior
right basal ganglia, most likely a dilated perivascular space.
Subtle loss of gray-white differentiation involving the right insula
with streak through this region. Small hypodensity in the right
basal ganglia, favored to represent a dilated perivascular space.

Vascular: Calcific atherosclerosis. No definite hyperdense vessel
identified.

Skull: No acute fracture.  Left frontal contusion.

Sinuses/Orbits: Scattered paranasal sinus mucosal thickening.

Other: No mastoid effusions.

ASPECTS (Alberta Stroke Program Early CT Score)

- Ganglionic level infarction (caudate, lentiform nuclei, internal
capsule, insula, M1-M3 cortex): 6

- Supraganglionic infarction (M4-M6 cortex): 3

Total score (0-10 with 10 being normal): 9
IMPRESSION: 1. Subtle hypodensity in the right insular ribbon, which may be
related to streak artifact or an acute MCA territory infarct.
ASPECTS is 9. MRI could further evaluate if clinically indicated.
2. Multiple remote appearing infarcts, as described above.
3. No acute hemorrhage.
4. Left frontal scalp contusion without acute fracture.

Code stroke imaging results were communicated on 06/25/2020 at [DATE] to provider Dr. Jurievich via telephone, who verbally acknowledged
these results.

## 2021-03-14 IMAGING — CT CT ANGIO HEAD-NECK
2 of 7 series · 8 of 33 positions shown · IV contrast (OMNI 350)
Comparison: Same day CT head.

CLINICAL DATA: Concern for stroke.  Left-sided deficit.



[Series 5: cta neck · axial · 0.51mm/px · z∈[-247,-123]mm · 2 of 187 slices shown]
[im 63/187  soft-tissue]
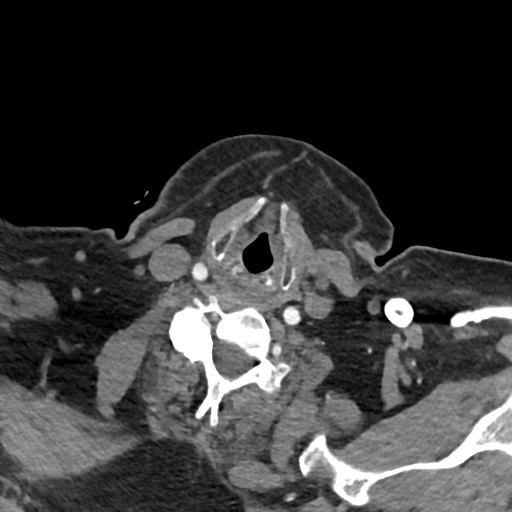
[im 125/187  soft-tissue]
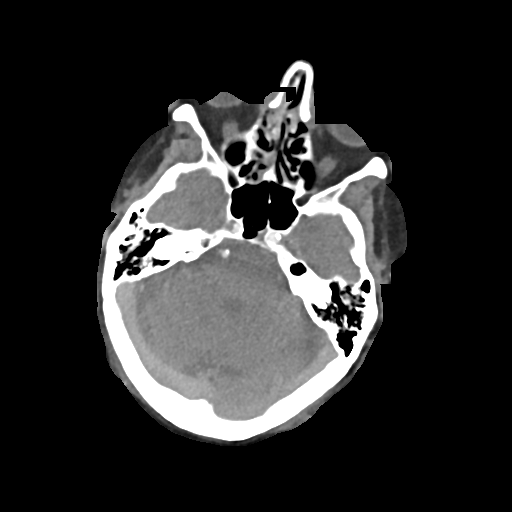

[Series 7: cta neck axial · axial · 0.39mm/px · z∈[-319,-58]mm · 6 of 367 slices shown]
[im 53/367  soft-tissue]
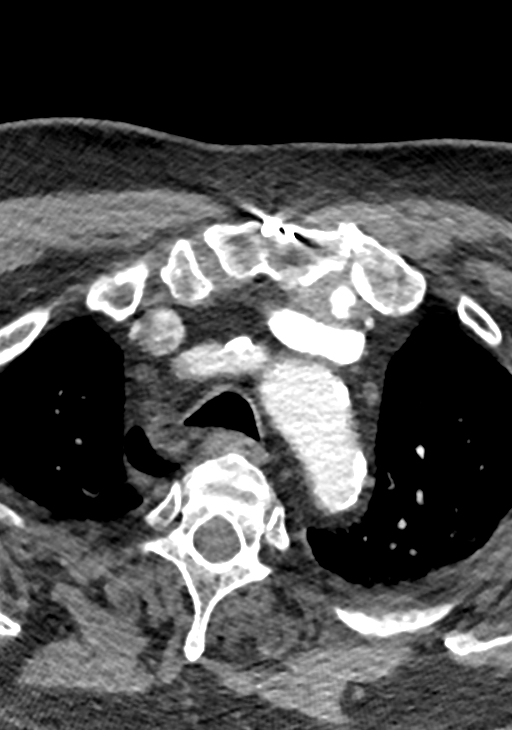
[im 105/367  bone]
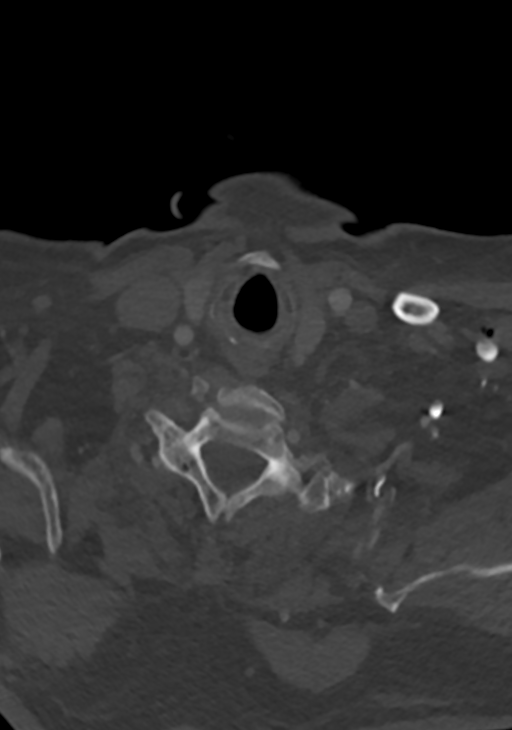
[im 157/367  soft-tissue]
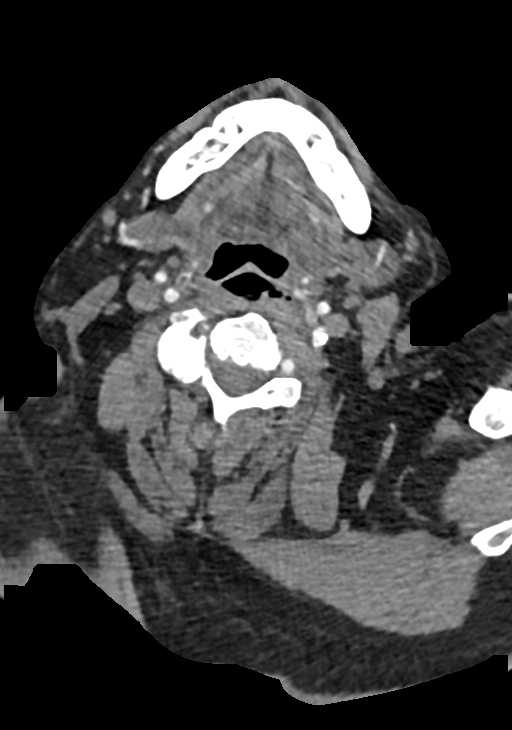
[im 210/367  bone]
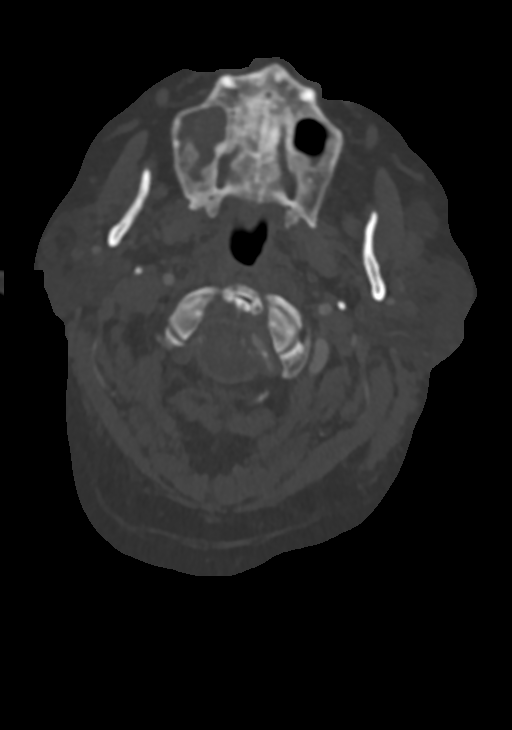
[im 262/367  soft-tissue]
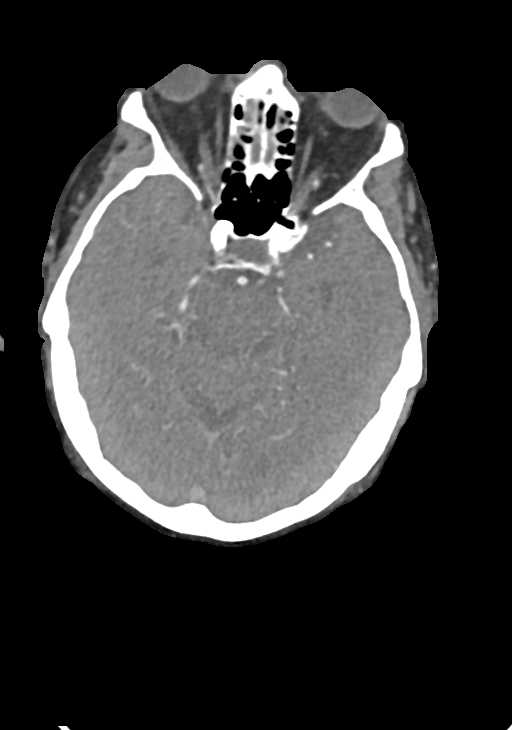
[im 314/367  bone]
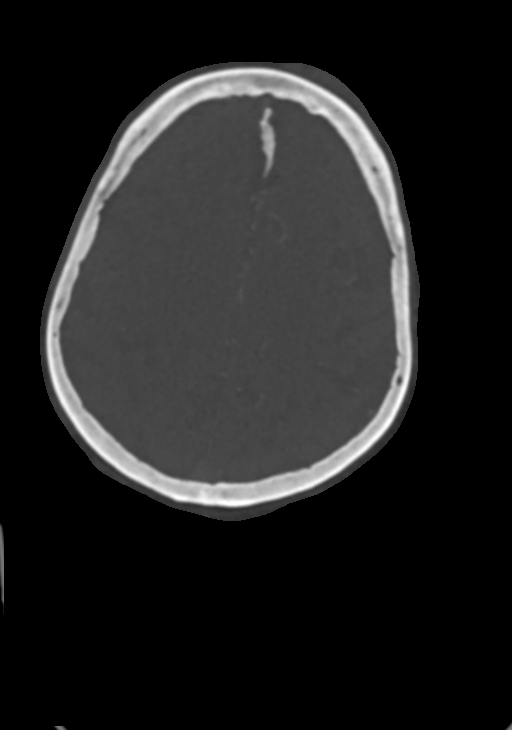

[8 of 33 positions shown; findings below may reference images not displayed]

FINDINGS: CTA NECK FINDINGS

Aortic arch: Calcific atherosclerosis of the aorta and its branch
vessels. Aneurysmal dilation of the ascending aorta up to 4.6 cm.

Right carotid system: Calcific atherosclerosis at the bifurcation
without greater than 50% narrowing.

Left carotid system: Calcific and noncalcific atherosclerosis at the
bifurcation with approximately 70% stenosis.

Vertebral arteries: The right vertebral origin is poorly visualized
secondary to streak artifact and beam hardening artifact. Suspected
at least moderate stenosis of the right vertebral artery, which is
non dominant. The right vertebral artery is diminutive throughout
its course and poorly opacified in the upper neck. Severe stenosis
at the dural margin with non opacification distally.

Skeleton: Multilevel degenerative changes of the cervical spine. No
evidence of acute fracture.

Other neck: No mass or suspicious adenopathy.

Upper chest: Dependent atelectasis.  Small right pleural effusion.

Review of the MIP images confirms the above findings

CTA HEAD FINDINGS

Anterior circulation: Focal filling defect within the distal right
M1 MCA, immediately proximal to the bifurcation (see series 7, image
102 and series 8, image 65). There is opacification of more distal
M2 branches. There is extensive calcific atherosclerosis of
bilateral cavernous carotid arteries with bilateral paraclinoid
narrowing, moderate to severe on the right and moderate on the left.
Multifocal mild narrowing of the left MCA and bilateral ACA branches
without evidence of hemodynamically significant stenosis.

Posterior circulation: Moderate stenosis of the left vertebral
artery origin. Additionally, there is multifocal moderate stenosis
of the intradural left vertebral artery and focal severe stenosis of
the distal intradural left vertebral artery. Mild narrowing of the
basilar artery. Bilateral posterior cerebral arteries are patent.
Six multifocal mild narrowing of the posterior cerebral arteries
without occlusion.

Venous sinuses: Poorly visualized.

Review of the MIP images confirms the above findings
IMPRESSION: 1. Focal filling defect in the distal right M1 MCA, concerning for
nonocclusive thrombus. Asymmetric more distal right MCA vasculature.
2. Calcific atherosclerosis of bilateral cavernous carotid arteries
with bilateral paraclinoid narrowing, moderate to severe on the
right and moderate on the left.
3. Moderate multifocal stenosis of the left vertebral artery with
focal severe stenosis of the distal left intradural vertebral
artery.
4. Suspected at least moderate stenosis of the non dominant right
vertebral artery with severe stenosis at the dural margin and non
opacification of the intradural right V4 vertebral segment.
5. Bilateral carotid bifurcation atherosclerosis with approximately
70% narrowing of the proximal left ICA.
6. Small right pleural effusion and dependent bilateral atelectasis.
7. Aneurysmal dilation of the partially imaged ascending aorta up to
4.6 cm. Recommend semi-annual imaging followup by CTA or MRA and
referral to cardiothoracic surgery if not already obtained. This
recommendation follows 3777
ACCF/AHA/AATS/ACR/ASA/SCA/TIGER/PRIMUS/GRIECO/RAMBAY Guidelines for the
Diagnosis and Management of Patients With Thoracic Aortic Disease.
Circulation. 3777; 121: E266-e36. Aortic aneurysm NOS ([id number])

Critical findings were communicated on 06/25/2020 at [DATE] to
provider Dr. Wilkins via telephone, who verbally acknowledged these
results.

## 2024-06-03 ENCOUNTER — Encounter: Payer: Self-pay | Admitting: Adult Health

## 2024-06-06 NOTE — Progress Notes (Signed)
 This encounter was created in error - please disregard.
